# Patient Record
Sex: Male | Born: 1935 | Race: White | Hispanic: No | Marital: Married | Smoking: Former smoker
Health system: Southern US, Community
[De-identification: ages and names within clinical notes are randomized; demographics above are authoritative.]

## PROBLEM LIST (undated history)

## (undated) DIAGNOSIS — I4821 Permanent atrial fibrillation: Secondary | ICD-10-CM

## (undated) DIAGNOSIS — R0602 Shortness of breath: Secondary | ICD-10-CM

## (undated) DIAGNOSIS — H9190 Unspecified hearing loss, unspecified ear: Secondary | ICD-10-CM

## (undated) DIAGNOSIS — E039 Hypothyroidism, unspecified: Secondary | ICD-10-CM

## (undated) DIAGNOSIS — M1990 Unspecified osteoarthritis, unspecified site: Secondary | ICD-10-CM

## (undated) DIAGNOSIS — M549 Dorsalgia, unspecified: Secondary | ICD-10-CM

## (undated) DIAGNOSIS — I209 Angina pectoris, unspecified: Secondary | ICD-10-CM

## (undated) DIAGNOSIS — W19XXXA Unspecified fall, initial encounter: Secondary | ICD-10-CM

## (undated) DIAGNOSIS — I219 Acute myocardial infarction, unspecified: Secondary | ICD-10-CM

## (undated) DIAGNOSIS — Z950 Presence of cardiac pacemaker: Secondary | ICD-10-CM

## (undated) DIAGNOSIS — T8859XA Other complications of anesthesia, initial encounter: Secondary | ICD-10-CM

## (undated) DIAGNOSIS — I2510 Atherosclerotic heart disease of native coronary artery without angina pectoris: Secondary | ICD-10-CM

## (undated) DIAGNOSIS — J189 Pneumonia, unspecified organism: Secondary | ICD-10-CM

## (undated) DIAGNOSIS — T4145XA Adverse effect of unspecified anesthetic, initial encounter: Secondary | ICD-10-CM

## (undated) DIAGNOSIS — C801 Malignant (primary) neoplasm, unspecified: Secondary | ICD-10-CM

## (undated) HISTORY — DX: Permanent atrial fibrillation: I48.21

## (undated) HISTORY — PX: JOINT REPLACEMENT: SHX530

---

## 1997-10-25 ENCOUNTER — Encounter (HOSPITAL_COMMUNITY): Admission: RE | Admit: 1997-10-25 | Discharge: 1998-01-23 | Payer: Self-pay | Admitting: Cardiology

## 1997-12-15 ENCOUNTER — Ambulatory Visit (HOSPITAL_COMMUNITY): Admission: RE | Admit: 1997-12-15 | Discharge: 1997-12-15 | Payer: Self-pay | Admitting: Internal Medicine

## 1998-02-12 ENCOUNTER — Emergency Department (HOSPITAL_COMMUNITY): Admission: EM | Admit: 1998-02-12 | Discharge: 1998-02-12 | Payer: Self-pay | Admitting: Internal Medicine

## 1998-12-19 ENCOUNTER — Emergency Department (HOSPITAL_COMMUNITY): Admission: EM | Admit: 1998-12-19 | Discharge: 1998-12-19 | Payer: Self-pay | Admitting: Endocrinology

## 1999-02-01 ENCOUNTER — Ambulatory Visit (HOSPITAL_COMMUNITY): Admission: RE | Admit: 1999-02-01 | Discharge: 1999-02-01 | Payer: Self-pay | Admitting: Internal Medicine

## 1999-02-24 ENCOUNTER — Ambulatory Visit (HOSPITAL_COMMUNITY): Admission: RE | Admit: 1999-02-24 | Discharge: 1999-02-24 | Payer: Self-pay | Admitting: Internal Medicine

## 1999-02-24 ENCOUNTER — Encounter (INDEPENDENT_AMBULATORY_CARE_PROVIDER_SITE_OTHER): Payer: Self-pay | Admitting: Specialist

## 2000-09-22 ENCOUNTER — Encounter: Admission: RE | Admit: 2000-09-22 | Discharge: 2000-09-22 | Payer: Self-pay | Admitting: Orthopedic Surgery

## 2000-09-22 ENCOUNTER — Encounter: Payer: Self-pay | Admitting: Orthopedic Surgery

## 2000-09-25 ENCOUNTER — Ambulatory Visit (HOSPITAL_BASED_OUTPATIENT_CLINIC_OR_DEPARTMENT_OTHER): Admission: RE | Admit: 2000-09-25 | Discharge: 2000-09-25 | Payer: Self-pay | Admitting: Orthopedic Surgery

## 2001-07-31 ENCOUNTER — Encounter: Admission: RE | Admit: 2001-07-31 | Discharge: 2001-08-27 | Payer: Self-pay | Admitting: Cardiology

## 2001-10-23 ENCOUNTER — Encounter: Payer: Self-pay | Admitting: Internal Medicine

## 2001-10-23 ENCOUNTER — Encounter: Admission: RE | Admit: 2001-10-23 | Discharge: 2001-10-23 | Payer: Self-pay | Admitting: Internal Medicine

## 2002-08-02 ENCOUNTER — Emergency Department (HOSPITAL_COMMUNITY): Admission: EM | Admit: 2002-08-02 | Discharge: 2002-08-03 | Payer: Self-pay | Admitting: Emergency Medicine

## 2002-08-02 ENCOUNTER — Encounter: Payer: Self-pay | Admitting: Emergency Medicine

## 2004-02-19 ENCOUNTER — Ambulatory Visit (HOSPITAL_COMMUNITY): Admission: RE | Admit: 2004-02-19 | Discharge: 2004-02-19 | Payer: Self-pay | Admitting: Cardiology

## 2005-11-11 ENCOUNTER — Emergency Department (HOSPITAL_COMMUNITY): Admission: EM | Admit: 2005-11-11 | Discharge: 2005-11-11 | Payer: Self-pay | Admitting: Family Medicine

## 2006-01-21 ENCOUNTER — Emergency Department (HOSPITAL_COMMUNITY): Admission: EM | Admit: 2006-01-21 | Discharge: 2006-01-21 | Payer: Self-pay | Admitting: Family Medicine

## 2006-06-20 ENCOUNTER — Encounter: Admission: RE | Admit: 2006-06-20 | Discharge: 2006-06-20 | Payer: Self-pay | Admitting: Internal Medicine

## 2006-06-24 ENCOUNTER — Encounter: Admission: RE | Admit: 2006-06-24 | Discharge: 2006-06-24 | Payer: Self-pay | Admitting: Internal Medicine

## 2007-04-29 ENCOUNTER — Inpatient Hospital Stay (HOSPITAL_COMMUNITY): Admission: EM | Admit: 2007-04-29 | Discharge: 2007-05-03 | Payer: Self-pay | Admitting: Emergency Medicine

## 2007-04-29 ENCOUNTER — Encounter (INDEPENDENT_AMBULATORY_CARE_PROVIDER_SITE_OTHER): Payer: Self-pay | Admitting: Neurology

## 2007-04-30 ENCOUNTER — Encounter (INDEPENDENT_AMBULATORY_CARE_PROVIDER_SITE_OTHER): Payer: Self-pay | Admitting: Internal Medicine

## 2007-04-30 ENCOUNTER — Ambulatory Visit: Payer: Self-pay | Admitting: *Deleted

## 2007-05-02 DIAGNOSIS — Z950 Presence of cardiac pacemaker: Secondary | ICD-10-CM

## 2007-05-02 HISTORY — PX: PERMANENT PACEMAKER INSERTION: SHX6023

## 2007-05-02 HISTORY — DX: Presence of cardiac pacemaker: Z95.0

## 2007-05-17 ENCOUNTER — Emergency Department (HOSPITAL_COMMUNITY): Admission: EM | Admit: 2007-05-17 | Discharge: 2007-05-17 | Payer: Self-pay | Admitting: Family Medicine

## 2007-05-31 ENCOUNTER — Emergency Department (HOSPITAL_COMMUNITY): Admission: EM | Admit: 2007-05-31 | Discharge: 2007-05-31 | Payer: Self-pay | Admitting: Emergency Medicine

## 2007-10-29 ENCOUNTER — Ambulatory Visit: Payer: Self-pay | Admitting: Internal Medicine

## 2007-11-18 ENCOUNTER — Inpatient Hospital Stay (HOSPITAL_COMMUNITY): Admission: EM | Admit: 2007-11-18 | Discharge: 2007-11-20 | Payer: Self-pay | Admitting: Emergency Medicine

## 2007-11-18 ENCOUNTER — Encounter (INDEPENDENT_AMBULATORY_CARE_PROVIDER_SITE_OTHER): Payer: Self-pay | Admitting: Internal Medicine

## 2007-11-19 ENCOUNTER — Encounter (INDEPENDENT_AMBULATORY_CARE_PROVIDER_SITE_OTHER): Payer: Self-pay | Admitting: Internal Medicine

## 2008-03-22 ENCOUNTER — Inpatient Hospital Stay (HOSPITAL_COMMUNITY): Admission: EM | Admit: 2008-03-22 | Discharge: 2008-03-26 | Payer: Self-pay | Admitting: Emergency Medicine

## 2010-02-09 HISTORY — PX: NM MYOCAR PERF WALL MOTION: HXRAD629

## 2010-08-20 ENCOUNTER — Other Ambulatory Visit: Payer: Self-pay | Admitting: Dermatology

## 2010-09-17 HISTORY — PX: US ECHOCARDIOGRAPHY: HXRAD669

## 2010-12-03 ENCOUNTER — Other Ambulatory Visit: Payer: Self-pay | Admitting: Cardiology

## 2010-12-03 ENCOUNTER — Ambulatory Visit
Admission: RE | Admit: 2010-12-03 | Discharge: 2010-12-03 | Disposition: A | Discharge status: Home / Self Care | Payer: Medicare Other | Source: Ambulatory Visit | Attending: Cardiology | Admitting: Cardiology

## 2010-12-03 DIAGNOSIS — Z01818 Encounter for other preprocedural examination: Secondary | ICD-10-CM

## 2010-12-07 NOTE — Assessment & Plan Note (Signed)
[facility]                         GASTROENTEROLOGY OFFICE NOTE   Bradley Nixon, Bradley Nixon                        MRN:          [id number]  DATE:10/29/2007                            DOB:          1921/01/08    Mr. Bradley Nixon is a very nice 77 year old gentleman, patient of Dr. Su Hilt  who he saw in the past for colorectal screening.  He has had at least 3  previous colonoscopies, last 2 showed adenomatous polyps.  He also has  diverticulosis of the left colon.  For the past 6-8 months, he has been  having looser stools with some urgency and actually 3 episodes of  incontinence.  Two episodes occurred while in the car traveling to visit  his sister.  He could not quite make it to the bathroom and had to  change his underwear.  The episodes are not preceded by any abnormal  pain.  He continues to have regular bowel habits, 1 bowel movement every  morning.  He denies any rectal bleeding.  His eating habits have not  changed.  He has taken about 3 stool softeners over past 2 years;  usually Colace.  Patient had a permanent transvenous pacemaker placed by  Dr. Aleen Campi for what sounds like a bradycardia syndrome.   MEDICATIONS:  1. Coumadin as directed.  2. Flomax 5 mg 2 q. day.  3. Crestor 10 mg q. day.  4. Vitamin E.   PAST MEDICAL HISTORY:  1. Significant for heart attack 1964.  2. Stroke in 1996.  3. Hemorrhoid surgery 1982.  4. Hemorrhoidal banding just 2 years ago by Dr. Zachery Dakins.   FAMILY HISTORY:  Negative for colon cancer.   SOCIAL HISTORY:  Married with 2 children.  He does not smoke, does not  drink alcohol.   REVIEW OF SYSTEMS:  Weight is stable.  Written changes/problems:  Decreased hearing.   PHYSICAL EXAMINATION:  Blood pressure 112/66.  Pulse 80.  Weight 196  pounds.  Patient was slightly hard of hearing.  Alert and oriented.  Very  cooperative.  LUNGS:  Clear to auscultation.  COR:  Normal S1, normal S2. Cardiac pacemaker in the  right side of the  chest below the right clavicle.  ABDOMEN:  Soft, nontender with relaxed abdominal muscles.  Normoactive  bowel sounds.  No tenderness.  RECTAL:  Anoscopic exam reveals normal perianal area.  Rectal tone was  normal.  Squeeze was normal.  First grade internal hemorrhoids, 1 of  them was larger than others.  No evidence of active bleeding.  No  thrombosis.  Stool was Hemoccult negative.   IMPRESSION:  An 77 year old white male with change in the bowel habits  which may be a result of severe diverticulosis. He may have symptomatic  hemorrhoids.  He has problems with continence which may be related  either to rectal irritation due to hemorrhoids or possibly due to  organic brain sybdrome.   PLAN:  1. Metamucil a tablespoon daily to bulk up the stools to improve      colonic emptying.  2. Anusol HC suppositories times 12 days.  3. Hold  off on colonoscopy due to his age and cardiac problems.  4. I will see him again in about 8 weeks and reexamine him.  Consider      adding antispasmodic such as Bentyl, or Robinul to his regime.     Bradley Nixon. Juanda Chance, MD  Electronically Signed    DMB/MedQ  DD: 10/29/2007  DT: 10/29/2007  Job #: [id number]   cc:   Antony Madura, M.D.

## 2010-12-07 NOTE — Consult Note (Signed)
NAME:  Bradley Nixon, Bradley Nixon NO.:  [id number]   MEDICAL RECORD NO.:  [id number]          PATIENT TYPE:  EMS   LOCATION:  MAJO                         FACILITY:  [facility]   PHYSICIAN:  Pramod P. Pearlean Brownie, MD    DATE OF BIRTH:  05/30/1921   DATE OF CONSULTATION:  DATE OF DISCHARGE:                                 CONSULTATION   REASON FOR CONSULTATION:  Left-sided weakness and numbness.   CONSULTED REQUESTED:  Emergency room Dr. Rosalia Hammers.   Time neurology was called at 5:45 a.m.   Time of evaluation is 6 a.m.   Time of onset of symptoms was 3 a.m.   HISTORY AND FINDINGS:  Bradley Nixon is a pleasant 77 year old right-handed  Caucasian male with a history of coronary artery disease status post  pacemaker placement and history of CVA in the past who woke up at 3 a.m.  to go use the bathroom.  He felt that his left leg was weak and he was  having difficulties with his balance.  He denied any headache.  He also  noticed that his left arm was not right and may be was weak and his  mouth was dry.  He also complains of shortness of breath and some chest  pressure.  He had some mild headache on the left side, but states that  his face is numb.  As he was having these symptoms, he told his wife and  she called EMS and he was brought in here.  At the time of initial  evaluation per Dr. Rosalia Hammers in the emergency room, the patient had some left  hand droop and had balance problems.  He went into the CT.  At the time  of my examination, the patient complains of left facial numbness and leg  numbness, but no weakness.  He denies any chest pressure, does complain  of dry mouth and mild shortness of breath.  In the morning, since he  thought this was because of stroke-like symptoms, he took 1-1/2 pills of  his 5 mg of Coumadin.   His wife thinks he looks a lot better now.   Yesterday, he was feeling fine without any GI abnormalities and recently  he has had a physical with lead work that were all  unremarkable as per  the patient.   PAST MEDICAL HISTORY:  1. Cerebrovascular accident in 1996 where he says he passed out, but      does not remember any focal deficits.  2. Hyperlipidemia.  3. Myocardial infarction.  4. Bradycardic syndrome status post pacemaker placement last year.  5. Hard of hearing.   PAST SURGICAL HISTORY:  Cataract surgeries and pacemaker placement.   SOCIAL HISTORY:  The patient denied no history of alcohol, smoking, or  drug use.   FAMILY HISTORY:  No history of any CVA or coronary artery disease.  No  history of diabetes.   REVIEW OF SYSTEMS:  All systems were reviewed, but none positive as  mentioned in the history and physical.   PHYSICAL EXAMINATION:  VITALS SIGNS:  His blood pressure was 141/79,  pulse rate 92, respirations 16, temperature 96.7 and he is saturating  93% on 2-3 liters.  GENERAL:  [name] is a pleasant gentleman in no acute distress.  HEENT:  Normocephalic and atraumatic.  NECK:  Supple.  No carotid bruits.  CARDIOVASCULAR:  Regular rhythmic rate.  LUNGS:  Clear to air entry.  EXTREMITIES:  No cyanosis, clubbing or edema.  NEUROLOGIC:  The patient was awake, alert, and oriented.  No dysarthria  or aphasia.  No difficulty with speech or comprehension.  Cranial nerves II through XII were intact.  Visual field was intact to  confrontation.  Pupils were symmetric and reactive to light and  accommodation.  Extraocular movements were intact.  No facial asymmetry.  No facial deficits to fine touch or pin prick sensation.  Mild with  symmetric, palate elevation was symmetric.  Tongue was midline without  any atrophy or weakness.  Normal bulk and tone.  Strength is 5/5 in all  4 extremities.  No pronator drift or leg drift.  Deep tendon reflexes  were 2+ and symmetric in the upper extremities and knees and absent at  the ankles.  Sensation reveal normal to position, pinprick and touch,  though he does have mild subjective decreased  sensation to fine touch in  his left thigh, but normal to pinprick sensation.  Vibration was  decreased till the knees bilaterally.  Plantars  were downgoing.  Normal  finger-to-nose testing, rapid alternating movements and knee-heel  drifting.  Romberg was absent.  Gait was hesitant.   LABORATORY DATA:  His CT of the head was reviewed.  There was no acute  changes, still see some old lacunar stroke especially in the left basal  ganglion, which was apparently unchanged from October 2008.  His labs  were reviewed, his INR is 2.2.  CBC was unremarkable with a hemoglobin  of 13.3, hematocrit 38.8, platelets 212, and white count 7.7.  His  sodium 142, potassium 3.6, chloride 110, bicarb 22, BUN 24, creatine 1,  and glucose 111.  PTT was 36.  Cardiac enzymes were unavailable at this  time.   IMPRESSION:  Transient left-sided weakness/numbness with some chest  pressure.  The etiologies could be transient ischemic attack versus  cardiac ischemia/angina/infarction.  Currently, the patient's  symptoms  have totally resolved and his INR is therapeutic.  At this time, I would  not recommend any changes in the management and continue his INR.  We  would recommend getting carotid Dopplers as if there is a significant  narrowing.  The patient may be a candidate for stenting.  I will also  recommend echocardiogram and serial cardiac enzymes to evaluate for any  cardiac etiology as his symptoms have been on the left side and he does  notice some chest pressure and shortness of breath.  We will also  recommend physical therapy evaluation for his gait problems.  The  patient cannot get an MRI due to pacemaker placement.   The Stroke Consult Service will follow the patient in the morning.  The  plan was conveyed to the patient and to the emergency room physician.     Darnelle Bos, MD  Electronically Signed     ______________________________  Sunny Schlein. Pearlean Brownie, MD   RB/MEDQ  D:   11/18/2007  T:  11/18/2007  Job:  [id number]

## 2010-12-07 NOTE — H&P (Signed)
NAME:  Bradley Nixon NO.:  [id number]   MEDICAL RECORD NO.:  [id number]          PATIENT TYPE:  INP   LOCATION:  3021                         FACILITY:  [facility]   PHYSICIAN:  Marcellus Scott, MD     DATE OF BIRTH:  01/24/1921   DATE OF ADMISSION:  04/29/2007  DATE OF DISCHARGE:                              HISTORY & PHYSICAL   PRIMARY CARE PHYSICIAN:  Antony Madura, M.D.   PRIMARY CARDIOLOGIST:  Antionette Char, MD   CHIEF COMPLAINT:  Dizziness, vertigo, slurred speech, lower extremity  weakness with near collapse - resolved.   HISTORY OF PRESENT ILLNESS:  Bradley Nixon is a pleasant 77 year old  Caucasian male patient, with past medical history as indicated below.  He was in his usual state of health until this morning.  The patient  consumed breakfast between 7 and 8 a.m. 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  While standing in church, at about 11:10 a.m.,  the patient suddenly noticed blurred vision, dizziness, vertigo,  sensation of numbness of both lower extremities and feeling like he was  going to fall.  He called out for help and was seated onto a chair.  Subsequently, patient also felt some slurring of his speech and some  twisting of his mouth.  In a little while he was helped by colleagues  down the steps.  His wife subsequently reached the church.  The patient  could barely walk and had to be helped into the car.  The patient denies  any history of chest pain, palpitations or dyspnea or diaphoresis.  The  patient was nauseous.  There was no history of diplopia.  On arrival in  the emergency room, he was helped onto a wheelchair and onto the gurney.  He claims that any time he was moved around, the vertigo would get  worse, and the symptoms persisted.  The patient subsequently had a CT of  his head and blood work done.  At some point, while in the emergency  room, while being examined by the neurologist, the patient said suddenly  his symptoms all disappeared.  Since then,  the patient has had  intermittent vertigo when he is being turned around.  But other symptoms  have all resolved.   On arrival to the emergency room, a stroke code was called.  The patient  was initially seen by Neurology, who assessed him to have a TIA, with  resolution of his neurological deficits.  We were asked to admit the  patient.   PAST MEDICAL HISTORY:  1. Hyperlipidemia.  2. Status post myocardial infarction x3 - 1964/1994/1996.  3. Status post angioplasty x2 in 1994 and 1996.  4. Cerebral vascular accident in 1998, with no residual deficits after      rehabilitation.  5. Macular degeneration in the right eye.  6. Bilateral cataracts.  7. Deafness both ears, right greater than the left.   PAST SURGICAL HISTORY:  Right rotator cuff surgery.   ALLERGIES:  NO KNOWN DRUG ALLERGIES.   MEDICATIONS:  1. Coumadin 5 mg  on Tuesdays, Thursdays, Saturdays and Sundays.  2. Coumadin 7.5 mg on Mondays, Wednesdays and Fridays.  The patient      has been taking Coumadin since 1965 status post MI.  3. Crestor 10 mg p.o. q.h.s.  4. Flomax 0.8 mg p.o. daily.   FAMILY HISTORY:  The patient's mother died at age 48 years, and father  died at age 26 years from an MI.   SOCIAL HISTORY:  The patient is married.  He lives with his wife.  He is  independent of activities of daily living.  He is a retired traveling  Medical illustrator.  He briefly smoked between ages 68 and 24 years and quit.  There is no history of alcohol and drug abuse.   REVIEW OF SYSTEMS:  Fourteen systems reviewed and apart from the history  of presenting illness is noncontributory.   PHYSICAL EXAMINATION:  GENERAL:  Mr. Perrow is a moderately built, well-  nourished male patient in no obvious distress.  VITAL SIGNS:  Temperature 98.8 degrees Fahrenheit.  Pulse is 87 per  minute, regular.  Respirations, 22 per minute.  Blood pressure 136/85  mmHg.  Oxygen saturation of 95% on 2 liters  per minute.  HEAD, EYES, ENT:  Nontraumatic.  Normocephalic.  His pupils equally  reacting to light and accommodation.  Bilateral cataracts.  Right eye  with decreased visual acuity, which is chronic.  The patient has  bilaterally enlarged tonsils, but not inflamed.  NECK:  Without JVD, carotid bruit, lymphadenopathy or goiter.  Supple.  LYMPHATICS:  No lymphadenopathy.  RESPIRATORY SYSTEM:  Clear to auscultation bilaterally.  CARDIOVASCULAR SYSTEM:  First and second heart sounds are heard.  No  third or fourth heart sounds or murmurs or rubs or gallops or clicks.  ABDOMEN:  Is nondistended, nontender.  No organomegaly or mass  appreciated.  Bowel sounds are normally heard.  CENTRAL NERVOUS SYSTEM:  The patient is awake, alert, oriented x3.  There is no cranial nerve deficits.  EXTREMITIES:  With no cyanosis, clubbing or edema.  Peripheral pulses  are symmetrically felt.  There is no focal deficits or pronator drift.  SKIN:  Is without any rashes.   LABORATORY DATA:  His urinalysis is negative for features of urinary  tract infection.  CBC is normal, with a hemoglobin 14.4, hematocrit  42.8, INR of 2.1.  Basic metabolic panel is normal, with BUN of 13,  creatinine of 0.86.  Hepatic panel is normal.  Cardiac enzymes x1 is  normal.  CT of the head without contrast:  Impression is negative for  bleed or other intracranial process. Atrophy and old left  periventricular lacunar infarct.  MRI of the brain.  No acute  intracranial abnormality.  Mild chronic small vessel disease.  Mild left  frontal - ethmoid sinus disease.  MRA of the head is negative.   ASSESSMENT AND PLAN:  1. Transient ischemia attack.  We will admit patient to Neurology unit      with telemetry.  We will obtain an echo and carotid Doppler.  We      will follow up on patient's fasting lipids, hemoglobin A1c, TSH and      homocysteine.  We will also follow up on patient's 12-lead EKG.      The patient has passed a bedside  swallow evaluation.  We will place      the patient on a heart healthy diet and continue patient's Coumadin      anticoagulation.  The patient is therapeutically anticoagulated  at      this time.  2. Hyperlipidemia.  To check fasting lipids and continue patient's      Crestor.  3. Coronary artery disease status post previous myocardial infarctions      and angioplasty.  Asymptomatic of cardiorespiratory symptoms.  We      will cycle cardiac enzymes and continue statins and Coumadin.  4. Prostatomegaly.  To continue Flomax.      Marcellus Scott, MD  Electronically Signed     AH/MEDQ  D:  04/29/2007  T:  04/29/2007  Job:  [id number]   cc:   Antionette Char, MD  Antony Madura, M.D.

## 2010-12-07 NOTE — Consult Note (Signed)
NAME:  BALDWIN, RACICOT NO.:  [id number]   MEDICAL RECORD NO.:  [id number]          PATIENT TYPE:  EMS   LOCATION:  MAJO                         FACILITY:  [facility]   PHYSICIAN:  Pramod P. Pearlean Brownie, MD    DATE OF BIRTH:  Feb 06, 1921   DATE OF CONSULTATION:  DATE OF DISCHARGE:                                 CONSULTATION   REASON FOR CONSULTATION:  Code stroke.   HISTORY OF PRESENT ILLNESS:  Mr. Nijjar is an 77 year old Caucasian male  who developed sudden onset of slurred speech, blurred vision, facial  droop, leg weakness, and dizziness at 11:10 a.m. today.  He stated he  would have fallen down if people had not caught him.  The symptoms  persisted and EMS was called.  He was brought to [facility] Emergency  [facility].  A code stroke was called by Dr. Ignacia Palma.  As per the patient,  after he came back from CT scan, the patient had noted improvement in  symptoms.  Speech and vision were much improved.  He states that the  whole episode lasted for about 30-40 minutes.  He does have a previous  history of stroke in 1998, when he was given PPA with results.  He lives  independently.  He states he is on Coumadin because of cardiac issue,  but is not sure whether this for atrial fibrillation or not.   PAST MEDICAL HISTORY:  Significant for poor vision in the right eye due  to macular degeneration, hyperlipidemia, heart size, or old stroke.   MEDICATIONS:  1. Coumadin.  2. Crestal.  3. Flomax.   MEDICATION ALLERGIES:  None known.   FAMILY HISTORY:  Noncontributory.   SOCIAL HISTORY:  He is married.  He lives with his wife in [place].  He does not smoke or drink.   PRIMARY CARE PHYSICIAN:  Antony Madura, M.D.   CARDIOLOGIST:  Antionette Char, MD   PHYSICAL EXAMINATION:  GENERAL:  Reveals a pleasant, obese elderly  gentleman not in distress.  VITAL SIGNS:  Pulse 77 per minute, regular sinus; respiratory rate 20  minute, temperature 97.8, blood pressure 157/75,  saturations 94% on room  air.  HEAD:  Nontraumatic.  NECK:  Supple.  There is no bruit.  ENT:  Unremarkable.  CARDIAC:  No murmur or gallop.  LUNGS:  Clear to auscultation.  ABDOMEN:  Soft, nontender.  NEUROLOGIC:  Patient is awake, alert, oriented x3.  There is no aphasia,  apraxia, or dysarthria.  Pupils are equal and reactive.  Eye movements  are full range.  Face is symmetric bilateral.  Movements are normal.  Tongue is midline.Marland Kitchen  His right eye vision is poor due to the macular  degeneration.  There is, however, no visual field deficit noted.  Motor  system exam reveals no upper extremity drift, symmetric stent on  reflexes, coordination and sensation.  His NIH stroke scale is 0.  Gait  was not tested.   LABORATORY DATA:  Noncontrast CT scan done today reveals mild  generalized atrophy.  No acute abnormality, old lacunar infarct in the  left deep basal gangli.   Admission labs and coagulation labs are pending at this time.   IMPRESSION:  77 year old gentleman with possible vertebrobasilar TIA  which seems to have resolved.  Previous history of old left lacunar  infarct with vascular risk factors of heart disease and hyperlipidemia.   PLAN:  Patient is being admitted to the [facility] for stroke  risk identification and workup.  Check MRI scan of the brain with MRA of  the brain, carotid transcranial Doppler studies, 2D echocardiogram,  fasting lipid profile, hemoglobin A1c and normal cysteine levels.  Continue Coumadin.  Check PT/INR.   I will be happy to follow the patient in consult and call if questions.           ______________________________  Sunny Schlein. Pearlean Brownie, MD     PPS/MEDQ  D:  04/29/2007  T:  04/29/2007  Job:  [id number]   cc:   Antony Madura, M.D.

## 2010-12-07 NOTE — Discharge Summary (Signed)
NAME:  Bradley Nixon, Bradley Nixon NO.:  [id number]   MEDICAL RECORD NO.:  [id number]          PATIENT TYPE:  INP   LOCATION:  3706                         FACILITY:  [facility]   PHYSICIAN:  Antionette Char, MD    DATE OF BIRTH:  22-Nov-1920   DATE OF ADMISSION:  03/22/2008  DATE OF DISCHARGE:  03/25/2008                               DISCHARGE SUMMARY   DISCHARGE DIAGNOSES:  1. Chest pain, noncardiac, negative myocardial infarction.  2. Patent coronary arteries.  3. Anticoagulation with Coumadin presumed for cerebrovascular      accident.  We will resume as an outpatient with no crossover.  4. Hypotension after 4 sublingual nitro, resolved.  5. History of coronary artery disease with left atherectomy in 1998,      history of myocardial infarction in 1981.  6. History cerebrovascular accident and transient ischemic attack.  7. History of bradycardia with permanent transvenous pacemaker,      stable.   DISCHARGE CONDITION:  Improved.   PROCEDURES:  Combined left heart catheterization on March 25, 2008 by  Dr. Julieanne Manson.   DISCHARGE MEDICATIONS:  1. Aspirin 81 mg daily.  2. Toprol-XL 25 mg daily which is one half of his 50 mg tablet.  3. Crestor 10 mg daily.  4. Flomax 0.4 mg one twice a day.  5. Coumadin 5 mg daily except 7.5 mg Monday, Wednesday, and Friday as      before and began tonight, March 25, 2008.  6. Have Coumadin level checked on Friday of this week, March 28, 2008.  7. Omeprazole 20 mg one daily over-the-counter prescription given.   DISCHARGE INSTRUCTIONS:  1. Low-sodium heart-healthy diet.  2. Increase activity slowly.  3. May shower on February 2009.  No lifting for 2 days.  No driving      for 2 days.  4. Wash cath site with soap and water.  Call us if any bleeding,      swelling, or drainage.  5. Follow up with Dr. Aleen Campi next week.  Their office should call      you with the date and time.   HISTORY OF PRESENT ILLNESS:  An  77 year old white married male, quite  delightful to talk with with a history of coronary artery disease with  atherectomies in 1992 and 1998 and prior to that, a massive MI in 1964.  Last nuclear study was in 2005 without ischemia.  The patient was seen  by Dr. Aleen Campi in July 2009 and the patient has been doing well for  some time.  He did have permanent transvenous pacemaker placed in 2008  due to bradycardia.  On March 22, 2008, he was driving to early morning  meeting, developed midsternal chest pressure, 6/10 and that eventually  radiated across his chest.  No associated symptoms, went to the meeting,  someone gave him a nitroglycerin without relief and then someone drove  him home.  He had nausea on the way home.  He repeated his nitroglycerin  2 to 3 more times at home about 30-45 minutes apart without  relief.  We  instructed him go to the emergency room when he called to ask what to  do.  Blood pressure on arrival to the ER was low down to 88/50 probably  secondary to the nitroglycerin.  He was started on oxygen.  His pain was  completely resolved and blood pressure was stable on evaluation.  EKG  showed no acute changes.  He does have a right bundle-branch block and  he was in sinus rhythm on EKG.  Last 2-D echo was on November 21, 2007, EF  was 55-65%.  History as stated TIAs, CVA, BPH, dyslipidemia, history of  impaired hearing, history of cataracts, and arthritis.   ALLERGIES:  No known allergies.   FAMILY HISTORY, SOCIAL HISTORY, REVIEW OF SYSTEMS:  See H and P.   PHYSICAL EXAMINATION ON DISCHARGE:  VITAL SIGNS:  Blood pressure 100/77,  pulse 72, respiratory rate 18, temp 98.1, and oxygen saturation 2 liters  93%.  Prior to discharge, we will check his room air oxygen saturations  with ambulation as well.   Right groin site is stable.   LABORATORY DATA:  Chest x-ray on admission, borderline cardiomegaly and  pulmonary vascular congestion, though he was hypotensive and  his BNP was  58.  The patient denied any shortness of breath on admission.   Hemoglobin on admission 14, hematocrit 42.5, WBC 6.8, and platelets 192.  These remained stable at discharge.  Hemoglobin 13, hematocrit 39, WBC  9.3, and platelets were normal.   Chemistry on admission; sodium 135, potassium 4, chloride 106, CO2 22,  BUN 13, creatinine 0.91, glucose 109.  At discharge, remained the same.   Coags on admission; protime 25 with INR of 2.1, PTT 39 and at discharge,  protime 19.3, INR of 1.6 and he had been on heparin, but that was  discontinued.  LFTs were normal.  Amylase was normal at 85, lipase  normal at 16.  Cardiac markers; CK-MBs were all negative 96, 77, 86 with  MBs 2.5, 1.8, and 2.2.  Troponin Is all negative, 0.02 to less than  0.01.  Calcium 8.3-8.6, magnesium 2.3.  BNP was 58 and TSH 2.978 and  urinalysis was clear.   EKG as stated.  Sinus rhythm with a right bundle-branch block.  No acute  changes.  Cardiac cath, please see dictated report.  Widely patent RCA,  PDA, and PLA.  Left main was normal.  Circumflex was stable.  Mild mid  distal stenosis of the LAD is best I can read from Dr. Fredirick Maudlin writing.  EF was greater than 55% with 1+ MR, distal aortogram, left renal artery  20-30% ostial stenosis, right stable.  No abdominal aortic aneurysm and  iliac for torturous.   HOSPITAL COURSE:  The patient was admitted with chest pain.  Enzymes  were negative.  He was placed on IV heparin once his INR was less than 2  and his Coumadin was held.  He underwent cardiac catheterization on  March 25, 2008 with Dr. Clarene Duke and was found to have stable coronary  arteries.  Dr. Aleen Campi of [facility] was  on-call for had seen him on the 31st and felt cardiac cath would be the  correct way to go on the prudent decision.  The patient was agreeable to  procedure.  The patient tolerated the procedure well and will discharge  home on March 25, 2008 if he ambulates without  problems.  He will  follow up with Dr. Aleen Campi in 1 week.  Darcella Gasman. Annie Paras, N.P.      Antionette Char, MD  Electronically Signed    LRI/MEDQ  D:  03/25/2008  T:  03/26/2008  Job:  [id number]   cc:   Thereasa Solo. Little, M.D.  Antony Madura, M.D.

## 2010-12-07 NOTE — Discharge Summary (Signed)
NAME:  Bradley Nixon, Bradley Nixon NO.:  [id number]   MEDICAL RECORD NO.:  [id number]          PATIENT TYPE:  INP   LOCATION:  2033                         FACILITY:  [facility]   PHYSICIAN:  Wilson Singer, M.D.DATE OF BIRTH:  1921-07-18   DATE OF ADMISSION:  04/29/2007  DATE OF DISCHARGE:  05/03/2007                               DISCHARGE SUMMARY   FINAL DISCHARGE DIAGNOSES:  1. Transient ischemic attack, resolved.  2. 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  3. Coronary artery disease.  4. Hyperlipidemia.  5. Benign prostatic hyperplasia.   MEDICATIONS ON DISCHARGE:  Coumadin to continue as per home dosing,  which was 5 mg Tuesday, Thursday, Saturdays and Sundays and 7.5 mg  Mondays, Wednesdays and Fridays, Crestor 10 mg daily, Flomax 0.8 mg  daily, Tylenol with Codeine #3 1 tablet b.i.d. as needed for pain.   CONDITION ON DISCHARGE:  Stable.   HISTORY:  GENERAL:  This very pleasant 77 year old man, was admitted  with a sudden onset of blurred vision, dizziness, vertigo, at 11:00 in  the morning in church.  Please see initial history physical examination  done by Dr. Marcellus Scott.   HOSPITAL PROGRESS:  The patient was admitted, and neurology saw him.  Neurology felt that he indeed did have a possible vertebrobasilar TIA,  which clearly had resolved.  He underwent bilateral carotid artery  Dopplers, which did not show any significant internal carotid artery  narrowing.  He also was seen by cardiology, as he appeared to have  second-degree heart block on his echocardiogram.  The cardiologist  degree that he had sinus and AV nodal disease, and that a permanent  pacemaker would be appropriate at this stage.  Therefore, he did have a  permanent pacemaker inserted on May 02, 2007.  An echocardiogram had  been done also, which showed a good ejection fraction of 65%.  There  was  no diagnostic evidence of left ventricular wall motion abnormalities.  His symptoms had resolved, and he has done well status post dual-chamber  pacemaker implantation.  His Coumadin was discontinued, prior to the  procedure, and a reasonable INR was achieved prior to proceeding.  Today, he is doing well post-pacemaker insertion.   PHYSICAL EXAMINATION:  VITAL SIGNS:  Temperature 98.1, blood pressure  103/64, pulse 77, saturation 93% on room air.  HEART:  Sounds are present and normal.  LUNG:  Fields are clear.  He is alert and oriented with no focal  neurological signs.   INVESTIGATIONS:  Today shows sodium of 140, potassium 4.0, bicarbonate  27, BUN 15, creatinine 1.04, hemoglobin 13.4, white blood cell count  10.0, platelets 192.  INR today is 1.4.   FURTHER DISPOSITION:  Cardiology has not seen him this morning, but once  they feel happy, he can be released to go home.  He can certainly go  home, and he will continue to re-start his Coumadin as before, per the  neurologist's recommendations.  I would recommend that the followup with  the cardiologist, Dr. Aleen Campi, in the next 1 to 2 weeks and also with  his primary care physician in the next 1 to 2 weeks.      Wilson Singer, M.D.  Electronically Signed     NCG/MEDQ  D:  05/03/2007  T:  05/03/2007  Job:  [id number]

## 2010-12-07 NOTE — Discharge Summary (Signed)
NAME:  Bradley Nixon, Bradley Nixon NO.:  [id number]   MEDICAL RECORD NO.:  [id number]          PATIENT TYPE:  INP   LOCATION:  3731                         FACILITY:  [facility]   PHYSICIAN:  Isidor Holts, M.D.  DATE OF BIRTH:  [birth year]   DATE OF ADMISSION:  11/18/2007  DATE OF DISCHARGE:  11/20/2007                               DISCHARGE SUMMARY   PRIMARY MEDICAL DOCTOR:  Dr. Burton Apley.   PRIMARY CARDIOLOGIST:  Dr. Aleen Campi.   DISCHARGE DIAGNOSES:  1. TIA with transient left hemiparesis.  2. History of coronary artery disease.  3. Dyslipidemia.  4. BPH.  5. Previous history of TIAs, currently on chronic anticoagulation.  6. Cerebrovascular disease, status post CVA 1998 with minimal deficit.  7. History of bradyarrhythmia, status post permanent pacer.  8. History of deafness.  9. History of cataracts.   DISCHARGE MEDICATIONS:  1. Crestor 5 mg p.o. daily.  2. Flomax 0.4 mg p.o. daily.  3. Toprol XL 12.5 mg p.o. daily.  4. Aspirin enteric-coated 81 mg p.o. daily over-the-counter.  5. Coumadin per prior regimen, i.e. 5 mg alternating with 7.5 mg at      06:00p.m. daily.   PROCEDURES:  1. Head CT scan dated November 18, 2007.  This showed no acute      intracranial abnormalities.  There was moderate cortical atrophy      and old lacunar strokes in the left basal ganglia unchanged since      October 2008.  2. Bilateral carotid and vertebral artery Duplex dated November 19, 2007.      This showed no ICA stenosis bilaterally.  Vertebral artery blood      flow is antegrade.  3. A 2-D echocardiogram dated November 19, 2007.  Report still pending at      the time of this evaluation.   CONSULTATIONS:  1. Dr. Delia Heady, neurologist.  2. Dr. Aleen Campi, cardiologist.   ADMISSION HISTORY:  As in H&P notes of November 18, 2007, dictated by Dr.  Lonia Blood.  However, in brief, this is an 77 year old male, with known  history of previous TIAs, coronary artery disease status post  MI x3,  status post stent times two, previous CVA, minimal deficit, BPH,  dyslipidemia, bradyarrhythmia status post pacemaker placement, on  chronic anticoagulation, who presents with a transient episode of left-  sided weakness.  He was brought to the emergency department, where  initial evaluation showed residual left-sided weakness, albeit much  improved from the time of onset.  He was admitted for further  evaluation, investigation and management.   CLINICAL COURSE:  1. TIA.  For details of presentation, refer to admission history      above.  The patient was evaluated by Dr. Delia Heady on neurology      consultation.  At the time of evaluation, his focal symptomatology      had practically resolved. It was concluded that this was likely      TIA.  However, appropriate workup was recommended.  The patient      underwent head CT scan which showed no evidence of  acute      intracranial pathology.  Brain MRI unfortunately could not be done,      secondary to presence of pacemaker.  The patient's INR was      therapeutic at 2.2.  He was commenced on low-dose Aspirin, in      addition to Coumadin anticoagulation.  Bilateral carotid/vertebral      artery duplex was negative for stenosis.  Lipid profile was      excellent.  The patient was continued on statin.  He was evaluated      by PT/OT, and outpatient follow-up was recommended.  By November 20, 2007, the patient was asymptomatic, and very keen to be discharged.      He was therefore discharged for routine follow-up with his primary      MD.   1. Coronary artery disease.  There were no symptoms referable to this,      during the course of the patient's hospitalization.  He did undergo      a 2-D echocardiogram and was seen by the cardiologist during his      hospitalization. Routine outpatient follow-up has been recommended.      Cardiac enzymes were un-elevated.  A 12-lead EKG showed no acute      ischemic changes.   1.  Dyslipidemia.  The patient's lipid profile was as follows:  Total      cholesterol 135, triglyceride 102, HDL of 38, LDL 77.  TSH was      normal at 1.167, i.e. excellent lipid profile.  The patient has      recommended to continue his pre-admission statin dosage.   1. BPH.  There were no problems referable to this.  Patient continues      on Flomax.   1. History of bradyarrhythmia, status post pacemaker.  No problems      referable to this.   DISPOSITION:  The patient was on November 20, 2007, completely  asymptomatic, keen to go home, and considered clinically stable for  discharge. He was discharged accordingly.   DISCHARGE INSTRUCTIONS:  1. Diet:  Heart-healthy.  2. Activity:  As tolerated.   FOLLOW-UP INSTRUCTIONS:  The patient is to follow up with his primary MD  Dr. Zenaida Deed, per prior scheduled appointment.  Also, with his  primary cardiologist Dr. Aleen Campi, per prior scheduled appointment.      Isidor Holts, M.D.  Electronically Signed     CO/MEDQ  D:  11/20/2007  T:  11/20/2007  Job:  [id number]   cc:   Antony Madura, M.D.  Antionette Char, MD  Pramod P. Pearlean Brownie, MD

## 2010-12-07 NOTE — Discharge Summary (Signed)
NAME:  Bradley Nixon, STGERMAINE NO.:  [id number]   MEDICAL RECORD NO.:  [id number]          PATIENT TYPE:  INP   LOCATION:  3706                         FACILITY:  [facility]   PHYSICIAN:  Antionette Char, MD    DATE OF BIRTH:  February 10, 1921   DATE OF ADMISSION:  03/22/2008  DATE OF DISCHARGE:  03/26/2008                               DISCHARGE SUMMARY   ADDENDUM:  Please note the patient was kept an extra night in the  hospital secondary to hypotension after cardiac catheterization.  Pressure, systolic dropped down to 88.  We gave him 250 mL fluid bolus,  IV fluids at a 100-hour x4, then 15-hour until midnight, and then stop  the fluids and he felt much better by the morning of March 26, 2008.   His labs were all stable, all within normal limits the morning of  discharge.  Additionally, the patient on his list and he had forgotten  to let us know that he is on amiodarone 200 mg twice a day at home.  He  had actually run out because the pharmacy was out, but he is to resume  that and we started it in the hospital and I believed Dr. Aleen Campi put  him on this probably with interrogation.  He got a hired of his  pacemaker and probably found some PAF, so he is on amiodarone as well as  his other medications previously listed.   The patient will follow up with Dr. Aleen Campi next week.  Any problems  prior to then, he will call Dr. Aleen Campi.      Darcella Gasman. Annie Paras, N.P.      Antionette Char, MD  Electronically Signed    LRI/MEDQ  D:  03/26/2008  T:  03/26/2008  Job:  [id number]   cc:   Thereasa Solo. Little, M.D.  Antony Madura, M.D.

## 2010-12-07 NOTE — Cardiovascular Report (Signed)
NAME:  Bradley Nixon, Bradley Nixon NO.:  [id number]   MEDICAL RECORD NO.:  [id number]          PATIENT TYPE:  INP   LOCATION:  2033                         FACILITY:  [facility]   PHYSICIAN:  Antionette Char, MD    DATE OF BIRTH:  [birth year]   DATE OF PROCEDURE:  05/02/2007  DATE OF DISCHARGE:                            CARDIAC CATHETERIZATION   Procedure done by Dr. Aleen Campi.   PROCEDURE:  Insertion of dual chamber permanent transvenous pacemaker  DDD mode.   INDICATIONS FOR PROCEDURE:  This 77 year old male was admitted with  symptoms of TIA and a stroke was ruled out.  On telemetry he was noted  to have second degree AV block and evidence for sick sinus syndrome with  marked sinus arrest and it was documented that his symptoms were related  to the bradyarrhythmia.  During his symptomatic episodes, he would have  heart rates in the low 30s with the new onset of severe bradyarrhythmia  from panconduction disorder including sick sinus syndrome and AV nodal  disease.  He was then scheduled for a cardiac catheterization for  insertion of a dual chamber pacemaker.   PROCEDURE IN DETAIL:  After signing an informed consent, the patient was  premedicated with 5 mg of Valium by mouth and brought to the cardiac  catheterization lab at [facility].  His right anterior chest  and base of neck were prepped and draped in a sterile fashion and a  right transverse subclavicular plane was anesthetized locally with 1%  lidocaine.  An incision was made in this anesthetized plane with the  incision being deepened into the fascial layer overlying the pectoralis  muscle.  After anesthetizing the fascial plane, a pocket was formed in  this plane for later insertion of the pulse generator.  We then inserted  two #7 [place] introducer sheaths percutaneously into the right  subclavian vein with the Seldinger wires being easily passed into the  superior vena cava.  We selected a bipolar  ventricular lead made by [facility].  [employer] model [phone], serial [id number].  After proper preparation,  this lead was inserted through the first [place] introducer sheath and  advanced to the superior vena cava.  We then selected a bipolar atrial J  lead made by [facility] model [phone], serial [id number].  Again,  after proper preparation, is inserted through the second [facility] introducer  sheath and advanced to the superior vena cava.  Both sheaths were  removed in the usual fashion.  Ventricular lead was then advanced into  the right atrium and retrograde across the tricuspid valve into the  right ventricle and positioned in the right ventricular apex.  Patient  parameters were obtained with R wave sensitivity of 10.9 millivolts, a  resistance of 935 ohms and a minimum voltage threshold of 0.4 volts  utilizing 0.4 milliamps of current.  We then advanced the atrial J lead  into the right atrium where the tip was positioned anteriorly in the  right atrial appendage.  Again, very good pacing parameters were  obtained with P wave sensitivity of  4.5 millivolts, a resistance of 575  ohms and a minimum voltage threshold of 0.9 volts utilizing 1.7  milliamps of current.  Both leads were then secured properly at their  insertion sites using 2-0 silk.  We then selected a pulse generator made  by [facility] model [place] model B062706, serial [id number].  After  properly analyzing the pulse generator, it was attached to the pacing  electrodes in the usual fashion.  The wound was lavaged profusely with  the kanamycin solution.  The pulse generator was then placed within the  previously formed pocket and the wound was closed in layers using 2-0  Vicryl.  Final skin closure was obtained with a cutaneous layer of Steri-  Strips.  The patient tolerated the procedure well and no complications  were noted.  At the end of the procedure, a sterile bulky dressing was  applied to the wound and he was returned  to his room in satisfactory  condition.   MEDICATIONS GIVEN:  None.   The pacemaker is noted to be functioning normally in the DDD mode.  Wound care instructions were given.      Antionette Char, MD  Electronically Signed     JRT/MEDQ  D:  05/02/2007  T:  05/02/2007  Job:  [id number]   cc:   Antionette Char, MD  Catheterization Laboratory at [facility]

## 2010-12-07 NOTE — Cardiovascular Report (Signed)
NAME:  Bradley Nixon, Bradley Nixon NO.:  [id number]   MEDICAL RECORD NO.:  [id number]          PATIENT TYPE:  INP   LOCATION:  3706                         FACILITY:  [facility]   PHYSICIAN:  Thereasa Solo. Little, M.D. DATE OF BIRTH:  1920-12-12   DATE OF PROCEDURE:  03/25/2008  DATE OF DISCHARGE:                            CARDIAC CATHETERIZATION   INDICATIONS FOR TEST:  This 77 year old male has remote history of  coronary disease with 2 interventions in the mid 90s and a cardiac  arrest in the late 60s.  He was admitted with chest pain.  He is on and  because of CVAs, the Coumadin has been discontinued.  His INR is now  subtherapeutic and he is brought to the cath lab for evaluation of his  coronary anatomy.   After obtaining informed consent, the patient was prepped and draped in  the usual sterile fashion exposing the right groin.  Following local  anesthetic with 1% Xylocaine, the Seldinger technique was employed and a  5-French introducer sheath was placed in the right femoral artery.  It  was obvious on fluoroscopy with the short J-wire used in place of the  sheath that the iliacs were tortuous.  Because of this, the right  coronary catheter with a Glidewire was used and advanced through the  tortuous iliacs and up into the ascending aorta.  Left and right  coronary arteriography and ventriculography, and a distal aortogram was  performed.   COMPLICATIONS:  None.   TOTAL CONTRAST:  140 mL.   EQUIPMENTS:  1. A 5-French Judkins configuration catheters.  2. A JL-3.5 was used to subselectively engage the left system.  The      coronary arteries appeared to be slightly out of plane.   MEDICATIONS:  IV Versed 1 mg.   RESULTS:  1. Hemodynamic monitoring.  Central aortic pressure was 98/53.  Left      ventricular pressure was 104/4, and there was no significant      gradient noted at the time of pullback.  2. Ventriculography.  Ventriculography in the RAO projection done  at      the end of the procedure showed normal LV systolic function.  There      were no wall motion abnormalities.  The ejection fraction was in      excess of 55%.  There was +1 mitral regurgitation and the left      ventricular end-diastolic pressure was 16.  3. Distal aortogram.  The distal aortogram done at the level of the      renal artery shows the left renal artery to have 20-30% ostial      narrowing.  The right renal artery was free of disease.  There was      no abdominal aortic aneurysm and the iliacs were tortuous without      significant stenosis.   Coronary arteriography:  The right coronary artery was widely patent.  It gave rise to a PDA and a posterior lateral vessel that were also free  of disease.  1. Left main normal, it bifurcated.  2. Circumflex.  The ongoing circumflex was free of disease.  It gave      rise to a very large trifurcating OM #1 that was free of disease.  3. The LAD across the apex of the heart had mild mid to distal      irregularities.  The first diagonal was patent with minimal ostial      narrowing.   CONCLUSION:  1. No significant coronary disease.  2. Normal left ventricular systolic function.  3. A +1 mitral regurgitation.  4. Tortuous iliacs.  5. Ostial left renal artery stenosis of 20-30%.   I cannot explain his chest pain from his cardiac anatomy.   I spoke with Dr. Aleen Campi, who wants the patient to be discharged to  home later today.  We will start his home dose of Coumadin this evening.  He will follow up with Dr. Aleen Campi in 1 week.           ______________________________  Thereasa Solo. Little, M.D.     ABL/MEDQ  D:  03/25/2008  T:  03/25/2008  Job:  [id number]   cc:   Antony Madura, M.D.  Antionette Char, MD  Catheterization Laboratory

## 2010-12-07 NOTE — H&P (Signed)
NAME:  Bradley Nixon, Bradley Nixon NO.:  [id number]   MEDICAL RECORD NO.:  [id number]          PATIENT TYPE:  INP   LOCATION:  3731                         FACILITY:  [facility]   PHYSICIAN:  Lonia Blood, M.D.      DATE OF BIRTH:  1921-03-20   DATE OF ADMISSION:  11/18/2007  DATE OF DISCHARGE:                              HISTORY & PHYSICAL   PRIMARY CARE PHYSICIAN:  Formerly Dr. Karilyn Cota, now Dr. Burton Apley.   CARDIOLOGIST:  Dr. Aleen Campi.   PRESENTING COMPLAINT:  Left-sided weakness.   HISTORY OF PRESENT ILLNESS:  The patient is an 77 year old gentleman  with history of dyslipidemia, TIA, and coronary artery disease status  post pacemaker placement who got up in the middle of the night with  complete weakness of his left side.  The patient was unable to move his  hands or leg.  He was rushed to emergency room.  Per patient when the  incident happened, he was able to take his Coumadin and also took some  nitroglycerin.  He had some mild shortness of breath, but no chest pain.  No nausea, vomiting, or diarrhea.  No loss of consciousness.  In the ED,  his functional status is improving steadily.  At this point, all his  symptoms have resolved.  The patient was evaluated by neurology, also  found to have recovered virtually all his function on the left side.   PAST MEDICAL HISTORY:  Significant for TIA, coronary artery disease,  chronic anticoagulation due to coronary artery disease.  He is status  post pacemaker placement, also dyslipidemia and BPH.   ALLERGIES:  He has no known drug allergies.   MEDICATIONS:  Include Crestor 5 mg daily, Flomax 0.4 mg daily, and  Coumadin 5 mg alternating with 7.5 mg.   SOCIAL HISTORY:  The patient is married, lives in [place].  He denied  any tobacco or alcohol or IV drug use.   FAMILY HISTORY:  Noncontributory.   REVIEW OF SYSTEMS:  A 12-point review of systems is negative except per  HPI.   PHYSICAL EXAMINATION:  VITALS:  On  exam, temperature 96.7, blood  pressure 141/79, pulse 92, respiratory rate 16, and sat 93% on room air.  GENERAL:  The patient is pleasant, alert, oriented, and in no acute  distress.  HEENT:  PERRL.  EOMI.  NECK:  Supple.  No JVD.  No lymphadenopathy.  RESPIRATORY:  The patient has good air entry bilaterally.  No wheezes.  No rales.  CARDIOVASCULAR:  S1 and S2.  No murmur.  ABDOMEN:  Soft and nontender with positive bowel sounds.  EXTREMITIES:  No edema, cyanosis, or clubbing.  NEUROLOGIC:  The patient has cranial nerves II through XII intact.  He  has good power 5/5, upper and lower extremities respectively.  Reflexes  are 2+ bilaterally, upper and lower extremities.  The patient had good  gait at this point.   LABORATORY DATA:  White count 6.7, hemoglobin 13.3, platelet count 212.  PT 25.5 and INR 2.2.  Sodium 142, potassium 3.6, chloride 110, BUN 24,  creatinine 1.0, glucose  111, and calcium 1.0.  CT head without contrast  showed no acute intracranial abnormalities.  There was moderate cortical  atrophy and lacunar stroke in the left basal ganglia, but these are  unchanged since 2008.  His EKG showed paced rhythm with a rate of 67,  some first-degree AV block with occasional premature ventricular  complexes.   ASSESSMENT:  Therefore, this is an 77 year old gentleman that is coming  in with what appears to be a transient ischemic attack.  The patient is  already anticoagulated and therapeutic on his Coumadin with an INR of  2.2.  He has risk factors for transient ischemic attack including  previous transient ischemic attack and at this point has recovered most  of his functions.   PLAN:  1. TIA.  We will admit the patient and check carotid Dopplers and a 2-      D echocardiogram.  Will check homocysteine level, also B12 level,      and RPR.  We will continue with his full anticoagulation at this      point with no specific change in management.  2. Coronary artery disease.  We  will check serial cardiac enzymes as      cardiac causes may be responsible for his symptoms.  He may be      having some vascular spasm also.  In the meantime, we will continue      with his home medications.  3. Dyslipidemia.  I will check fasting lipid panel and continue his      Crestor.  4. BPH.  We will continue with his Flomax at this point.  5. Finally, we will get PT, OT to work with the patient and hopefully      transition him home as soon as possible.      Lonia Blood, M.D.  Electronically Signed     LG/MEDQ  D:  11/18/2007  T:  11/18/2007  Job:  [id number]

## 2010-12-09 ENCOUNTER — Ambulatory Visit (HOSPITAL_COMMUNITY)
Admission: RE | Admit: 2010-12-09 | Discharge: 2010-12-09 | Disposition: A | Discharge status: Home / Self Care | Payer: Medicare Other | Source: Ambulatory Visit | Attending: Cardiology | Admitting: Cardiology

## 2010-12-09 ENCOUNTER — Ambulatory Visit (HOSPITAL_COMMUNITY): Admit: 2010-12-09 | Payer: Medicare Other | Admitting: Cardiology

## 2010-12-09 DIAGNOSIS — I4891 Unspecified atrial fibrillation: Secondary | ICD-10-CM | POA: Insufficient documentation

## 2010-12-09 DIAGNOSIS — Z950 Presence of cardiac pacemaker: Secondary | ICD-10-CM | POA: Insufficient documentation

## 2010-12-09 LAB — PROTIME-INR: INR: 3.32 — ABNORMAL HIGH (ref 0.00–1.49)

## 2010-12-10 NOTE — Op Note (Signed)
[place]  Patient:    JONATHANDAVID, MARLETT                     MRN: [id number] Proc. Date: 09/25/00 Adm. Date:  [id number] Attending:  Twana First                           Operative Report  PREOPERATIVE DIAGNOSIS: Right shoulder rotator cuff tear, with impingement.  POSTOPERATIVE DIAGNOSES:  1. Right shoulder partial rotator cuff tear.  2. Right shoulder impingement syndrome.  3. Right shoulder adhesive capsulitis.  OPERATION/PROCEDURE:  1. Right shoulder examination under anesthesia, followed by arthroscopic     debridement of partial rotator cuff tear.  2. Right shoulder subacromial decompression.  3. Right shoulder manipulation.  SURGEON: Elana Alm. Thurston Hole, M.D.  ASSISTANT: Kirstin A. Shepperson, P.A.  ANESTHESIA: General.  OPERATIVE TIME: Operative time was 45 minutes.  COMPLICATIONS: None.  INDICATIONS FOR PROCEDURE: Mr. Pridgen is a 77 year old gentleman who has had three months of increasing right shoulder pain, with signs and symptoms consistent with rotator cuff tear, partial versus complete tear; confirmed by MRI.  He has failed conservative care and is now to undergo arthroscopy.  DESCRIPTION OF PROCEDURE: Mr. Edmonston was brought to the operating room on September 25, 2000 and placed on the operating room table in the supine position. After an adequate level of general anesthesia was obtained his right shoulder was examined under anesthesia.  Initial range of motion was forward flexion of 170 degrees, abduction 150 degrees, internal and external rotation of 70 degrees.  Gentle manipulation was carried out, breaking up soft adhesions and improving range of motion to full.  The shoulder remained stable to ligamentous examination.  After this was done he was placed in the lateral position, right side up, and secured on the bed with the bean bag.  All genitalia and pressure points were well padded.  His right shoulder and  arm were prepped using sterile Betadine and draped using sterile technique. Fifteen pounds of abduction lateral traction was placed.  After this was done through a posterior portal the arthroscope with a pump attached was placed and through the anterior portal an arthroscopic probe placed.  On initial inspection the articular cartilage in the glenohumeral joint showed mild grade 1-2 chondromalacia, which was debrided.  The anterior labrum had a small partial tear, 25%, which was debrided.  The posterior labrum had mild fraying, which was debrided.  The biceps tendon had partial tearing, 25%, which was debrided.  The biceps tendon was intact otherwise and the biceps tendon anchor was intact.  The inferior labrum and anterior-inferior glenohumeral ligaments were intact.  The rotator cuff showed a partial tear, 50%, of the supraspinatus and infraspinatus, which was debrided arthroscopically, but it was otherwise found to be intact.  After this was done the subacromial space was entered and a lateral arthroscopic portal made.  Moderately thickened bursitis was resected with an [employer], [employer].  After this was done then the rotator cuff was thoroughly visualized and from the bursal side there was found to be fraying, again with partial tearing, but a complete tear was not found.  Subacromial decompression was carried out, removing 6-8 mm of the undersurface of the anterior, anterolateral, and anteromedial acromion, and CA ligament release carried out.  The [facility] joint was also co-planed.  After this was done through the lateral portal I also palpated the rotator cuff but,  again, I could not appreciate a complete tear.  At this point the shoulder could be brought throughout full range of motion with no impingement on the rotator cuff.  At this point it was felt that all pathology had been satisfactorily addressed.  A catheter was placed in the subacromial space for postoperative pain control.  The  subacromial space was injected also with 0.5% Marcaine.  Sterile dressings were applied after the portals were closed with 3-0 Nylon suture, and the catheter taped in place.  A sling was applied.  The patient was turned to the supine position, awakened, and taken to the recovery room in stable condition.  Sponge, needle, and instrument counts were correct x 2 at the end of the case.  FOLLOW-UP CARE: Mr. Evilsizer will be followed as an outpatient, on Percocet for pain control, for early physical therapy.  He will remove the pain pump in two days per our instructions and we will see him back in the office in a week for suture removal and follow-up. DD:  09/25/00 TD:  09/25/00 Job: [id number]

## 2011-01-08 NOTE — Cardiovascular Report (Signed)
  NAME:  Bradley Nixon, Bradley Nixon NO.:  [id number]  MEDICAL RECORD NO.:  [id number]           PATIENT TYPE:  O  LOCATION:  [facility]                         FACILITY:  [facility]  PHYSICIAN:  Thereasa Solo. Nialah Saravia, M.D. DATE OF BIRTH:  Jan 30, 1921  DATE OF PROCEDURE:  12/09/2010 DATE OF DISCHARGE:  12/09/2010                           CARDIAC CATHETERIZATION   This 77 year old male has a permanent pacemaker and has been in atrial fibrillation now for about 4-1/2 months.  He was initially reluctant to proceed on with cardioversion, but has now opted to pursue restoration of his rhythm.  He already has a permanent pacemaker, and he has a normal ejection fraction.  His left atrial dimension by echo on November 04, 2010, was 4.9 cm.  His INR today is 3.3.  With Anesthesia at the bedside, the patient was given 100 mg of IV Diprivan.  He underwent elective cardioversion prior to having signed the prior informed consent.  A 120-W seconds was used.  He converted into a sinus rhythm but went right back into atrial fibrillation.  The same anterior-posterior pads were used, a 150-W seconds now converted him back into a sinus rhythm with frequent PABs.  Because of this, we turned on the atrial fibrillation suppression option in his [employer] pacemaker.  Maximum paced rate will be 90.  He is currently maintaining a sinus rhythm.          ______________________________ Thereasa Solo. Ericah Scotto, M.D.     ABL/MEDQ  D:  12/09/2010  T:  12/10/2010  Job:  [id number]  Electronically Signed by Julieanne Manson M.D. on 01/08/2011 10:58:38 AM

## 2011-03-08 ENCOUNTER — Other Ambulatory Visit: Payer: Self-pay | Admitting: Dermatology

## 2011-04-19 LAB — URINALYSIS, ROUTINE W REFLEX MICROSCOPIC
Glucose, UA: NEGATIVE
Hgb urine dipstick: NEGATIVE
Ketones, ur: NEGATIVE
Nitrite: NEGATIVE
Protein, ur: NEGATIVE
Urobilinogen, UA: 0.2

## 2011-04-19 LAB — CARDIAC PANEL(CRET KIN+CKTOT+MB+TROPI)
Relative Index: 1.9
Troponin I: 0.01
Troponin I: 0.01

## 2011-04-19 LAB — CBC
HCT: 38.8 — ABNORMAL LOW
MCHC: 34.4
Platelets: 212
RDW: 14.8

## 2011-04-19 LAB — HOMOCYSTEINE: Homocysteine: 7.3

## 2011-04-19 LAB — DIFFERENTIAL
Basophils Relative: 1
Lymphocytes Relative: 40
Lymphs Abs: 2.7
Monocytes Absolute: 0.6
Monocytes Relative: 9
Neutro Abs: 3.3
Neutrophils Relative %: 49

## 2011-04-19 LAB — POCT I-STAT, CHEM 8
Creatinine, Ser: 1
Glucose, Bld: 111 — ABNORMAL HIGH
Hemoglobin: 13.3
TCO2: 22

## 2011-04-19 LAB — COMPREHENSIVE METABOLIC PANEL
Alkaline Phosphatase: 47
BUN: 18
CO2: 26
Chloride: 107
GFR calc non Af Amer: >60
Glucose, Bld: 114 — ABNORMAL HIGH
Potassium: 3.9
Total Bilirubin: 0.4

## 2011-04-19 LAB — PROTIME-INR: Prothrombin Time: 22.7 — ABNORMAL HIGH

## 2011-04-19 LAB — HEMOGLOBIN A1C
Hgb A1c MFr Bld: 6.4 — ABNORMAL HIGH
Mean Plasma Glucose: 151

## 2011-04-19 LAB — RPR: RPR Ser Ql: NONREACTIVE

## 2011-04-19 LAB — LIPID PANEL
Cholesterol: 135
HDL: 38 — ABNORMAL LOW
Total CHOL/HDL Ratio: 3.6

## 2011-04-27 LAB — CBC
HCT: 39
Hemoglobin: 13.2
MCHC: 33
MCHC: 33.8
MCV: 93.1
MCV: 94.6
RBC: 4.2 — ABNORMAL LOW
RDW: 15.1

## 2011-04-27 LAB — BASIC METABOLIC PANEL
CO2: 24
Chloride: 108
Glucose, Bld: 95
Potassium: 4.1
Sodium: 140

## 2011-04-27 LAB — HEPARIN LEVEL (UNFRACTIONATED): Heparin Unfractionated: 0.76 — ABNORMAL HIGH

## 2011-05-05 LAB — COMPREHENSIVE METABOLIC PANEL
ALT: 19
AST: 21
Albumin: 3.5
Alkaline Phosphatase: 45
BUN: 13
BUN: 15
CO2: 27
Calcium: 8.3 — ABNORMAL LOW
Chloride: 102
Chloride: 109
Creatinine, Ser: 1.04
GFR calc Af Amer: >60
GFR calc non Af Amer: >60
Glucose, Bld: 105 — ABNORMAL HIGH
Glucose, Bld: 123 — ABNORMAL HIGH
Potassium: 3.8
Sodium: 135
Total Bilirubin: 0.8
Total Bilirubin: 0.9

## 2011-05-05 LAB — DIFFERENTIAL
Basophils Absolute: 0
Basophils Relative: 0
Eosinophils Absolute: 0.1
Monocytes Absolute: 0.6
Neutro Abs: 5.5
Neutrophils Relative %: 64

## 2011-05-05 LAB — HEMOGLOBIN A1C
Hgb A1c MFr Bld: 6.1
Mean Plasma Glucose: 140

## 2011-05-05 LAB — CARDIAC PANEL(CRET KIN+CKTOT+MB+TROPI)
CK, MB: 2.7
CK, MB: 3
Relative Index: 2.5
Total CK: 108
Troponin I: 0.01
Troponin I: <0.01

## 2011-05-05 LAB — TROPONIN I: Troponin I: <0.01

## 2011-05-05 LAB — CBC
HCT: 39.9
HCT: 42.8
Hemoglobin: 13.4
Hemoglobin: 14.4
MCHC: 33.5
MCHC: 33.7
MCV: 91.3
RBC: 4.37
RDW: 14.8 — ABNORMAL HIGH
WBC: 10

## 2011-05-05 LAB — PROTIME-INR
INR: 2.1 — ABNORMAL HIGH
INR: 2.5 — ABNORMAL HIGH
Prothrombin Time: 17.4 — ABNORMAL HIGH

## 2011-05-05 LAB — LIPID PANEL
LDL Cholesterol: 47
Triglycerides: 95
VLDL: 19

## 2011-05-05 LAB — URINALYSIS, ROUTINE W REFLEX MICROSCOPIC
Bilirubin Urine: NEGATIVE
Ketones, ur: NEGATIVE
Nitrite: NEGATIVE
pH: 5.5

## 2011-05-05 LAB — TSH: TSH: 1.368

## 2011-05-05 LAB — CK TOTAL AND CKMB (NOT AT ARMC): Relative Index: 2.8 — ABNORMAL HIGH

## 2011-05-05 LAB — APTT: aPTT: 38 — ABNORMAL HIGH

## 2011-07-15 ENCOUNTER — Other Ambulatory Visit: Payer: Self-pay | Admitting: Cardiology

## 2011-07-18 ENCOUNTER — Encounter (HOSPITAL_COMMUNITY): Payer: Self-pay | Admitting: Pharmacy Technician

## 2011-07-21 ENCOUNTER — Other Ambulatory Visit: Payer: Self-pay | Admitting: Cardiology

## 2011-07-21 ENCOUNTER — Ambulatory Visit
Admission: RE | Admit: 2011-07-21 | Discharge: 2011-07-21 | Disposition: A | Discharge status: Home / Self Care | Payer: Medicare Other | Source: Ambulatory Visit | Attending: Cardiology | Admitting: Cardiology

## 2011-07-21 DIAGNOSIS — R0602 Shortness of breath: Secondary | ICD-10-CM

## 2011-07-28 ENCOUNTER — Other Ambulatory Visit: Payer: Self-pay

## 2011-07-28 ENCOUNTER — Encounter (HOSPITAL_COMMUNITY)
Admission: RE | Disposition: A | Discharge status: Home / Self Care | Payer: Self-pay | Source: Ambulatory Visit | Attending: Cardiology

## 2011-07-28 ENCOUNTER — Encounter (HOSPITAL_COMMUNITY): Payer: Self-pay | Admitting: *Deleted

## 2011-07-28 ENCOUNTER — Ambulatory Visit (HOSPITAL_COMMUNITY)
Admission: RE | Admit: 2011-07-28 | Discharge: 2011-07-28 | Disposition: A | Discharge status: Home / Self Care | Payer: Medicare Other | Source: Ambulatory Visit | Attending: Cardiology | Admitting: Cardiology

## 2011-07-28 ENCOUNTER — Ambulatory Visit (HOSPITAL_COMMUNITY): Payer: Medicare Other | Admitting: *Deleted

## 2011-07-28 DIAGNOSIS — I4891 Unspecified atrial fibrillation: Secondary | ICD-10-CM | POA: Insufficient documentation

## 2011-07-28 HISTORY — PX: CARDIOVERSION: SHX1299

## 2011-07-28 LAB — PROTIME-INR
INR: 2.64 — ABNORMAL HIGH (ref 0.00–1.49)
Prothrombin Time: 28.6 seconds — ABNORMAL HIGH (ref 11.6–15.2)

## 2011-07-28 SURGERY — CARDIOVERSION
Anesthesia: Moderate Sedation | Wound class: Clean

## 2011-07-28 MED ORDER — PROPOFOL 10 MG/ML IV BOLUS
INTRAVENOUS | Status: DC | PRN
  Administered 2011-07-28: 10 mg via INTRAVENOUS
  Administered 2011-07-28: 30 mg via INTRAVENOUS

## 2011-07-28 MED ORDER — SODIUM CHLORIDE 0.45 % IV SOLN: INTRAVENOUS | Status: DC

## 2011-07-28 MED ORDER — HYDROCORTISONE 1 % EX CREA: 1.0000 "application " | TOPICAL_CREAM | Freq: Three times a day (TID) | CUTANEOUS | Status: DC | PRN

## 2011-07-28 NOTE — Procedures (Signed)
Electrical Cardioversion Procedure Note Bradley Nixon [id number] 22-Nov-1920  Procedure: Electrical Cardioversion Indications:  Atrial Fibrillation  Procedure Details Consent: Risks of procedure as well as the alternatives and risks of each were explained to the (patient/caregiver).  Consent for procedure obtained. Time Out: Verified patient identification, verified procedure, site/side was marked, verified correct patient position, special equipment/implants available, medications/allergies/relevent history reviewed, required imaging and test results available.  Performed  Patient placed on cardiac monitor, pulse oximetry, supplemental oxygen as necessary.  Sedation given: Propofol - total of 40mg  Pacer pads placed anterior and posterior chest.  Cardioverted 3 time(s).  Cardioverted at biphasic.  Evaluation Findings: Post procedure EKG shows: NSR, after 3rd shock Complications: None Patient did tolerate procedure well.   Plan: Continue Amiodarone @ current dose Standard post anesthesia monitoring. D/c Home once stable. F/u as scheduled with Dr. Clarene Nixon.   Bradley Nixon W 07/28/2011, 9:15 AM

## 2011-07-28 NOTE — Transfer of Care (Signed)
Immediate Anesthesia Transfer of Care Note  Patient: Bradley Nixon  Procedure(s) Performed:  CARDIOVERSION  Patient Location: PACU and Short Stay  Anesthesia Type: MAC  Level of Consciousness: awake  Airway & Oxygen Therapy: Patient Spontanous Breathing and Patient connected to nasal cannula oxygen  Post-op Assessment: Report given to PACU RN and Post -op Vital signs reviewed and stable  Post vital signs: Reviewed and stable  Complications: No apparent anesthesia complications

## 2011-07-28 NOTE — Anesthesia Postprocedure Evaluation (Signed)
  Anesthesia Post-op Note  Patient: Bradley Nixon  Procedure(s) Performed:  CARDIOVERSION  Patient Location: PACU and Short Stay  Anesthesia Type: MAC  Level of Consciousness: awake, alert  and oriented  Airway and Oxygen Therapy: Patient Spontanous Breathing and Patient connected to nasal cannula oxygen  Post-op Pain: none  Post-op Assessment: Post-op Vital signs reviewed and Patient's Cardiovascular Status Stable  Post-op Vital Signs: Reviewed and stable  Complications: No apparent anesthesia complications

## 2011-07-28 NOTE — H&P (Addendum)
History and Physical Interval Note:  NAME:  Bradley Nixon   MRN: [id number] DOB:  1921/05/24   ADMIT DATE: 07/28/2011   07/28/2011 8:53 AM  INTERVAL H&P  - refer [facility] note dated 07/06/2011.  Bradley Nixon is a 78 y.o. male is a former patient of Dr. Adelene Idler. His history of tachybradycardia syndrome with permanent pacer placement  in 2008. He underwent cardioversion in May 2012 for intermittent atrial fibrillation throughout thousand 11. He is being maintained in sinus rhythm on Sotalol until November 2012 are began noticing more tiredness fatigue and shortness breath as noted to be back into fibrillation during the interrogation of his pacemaker. He was switched from sotalol to amiodarone which is nonoperable and he's been on warfarin with a therapeutic INR.  All his symptoms did improve be on amiodarone he continues to have some shortness of breath activities be in atrial fibrillation.  Review of systems notable for arthritis pain in his neck as well as his fatigue and shortness breath. Otherwise negative and consistent.  Present history: Hypertension, atrial fibrillation, status post a permanent pacemaker placement for tachycardia bradycardia syndrome, nonocclusive coronary disease by cath in September 2009.  He has presented today for an outpatient procedure, with the diagnosis of Atrial Fibrillation, with a plan for elective cardioversion. His pacemaker interrogated doesn't does indeed show that his atrial fibrillation his EKG shows a paced rhythm only with no clear atrial rhythm.  The various methods of treatment have been discussed with the patient and family. After consideration of risks, benefits and other options for treatment, the patient has consented to Procedure(s):  Synchronized Direct-Current Cardioversion as an intervention.   The patients' history has been reviewed, patient examined, no change in status from most recent note, stable for surgery. I have reviewed the  patients' chart and labs. Questions were answered to the patient's satisfaction.   BP 129/83  Pulse 69  Temp(Src) 98.8 F (37.1 C) (Oral)  Resp 18  Ht 5\' 10"  (1.778 m)  Wt 77.111 kg (170 lb)  BMI 24.39 kg/m2  SpO2 96% General appearance: alert, cooperative, appears stated age and no distress Neck: no adenopathy, no carotid bruit, no JVD, supple, symmetrical, trachea midline and thyroid not enlarged, symmetric, no tenderness/mass/nodules Lungs: clear to auscultation bilaterally and non-labored Heart: regular rate and rhythm, S1, S2 normal, no murmur, click, rub or gallop and - paced rhythm Abdomen: soft, non-tender; bowel sounds normal; no masses,  no organomegaly Extremities: extremities normal, atraumatic, no cyanosis or edema Pulses: 2+ and symmetric Neurologic: Alert and oriented X 3, normal strength and tone. Normal symmetric reflexes. Normal coordination and gait INR 2.34  The patient will be seen by anesthesiology for sedation, and will reinterrogate his device post cardioversion to ensure that he is in sinus rhythm.  Bradley Nixon W [facility] [address] [place], [place]  [address]  [phone]  07/28/2011 8:53 AM

## 2011-07-28 NOTE — Preoperative (Signed)
Beta Blockers   Reason not to administer Beta Blockers:Pt took BB 07-27-11 in pm

## 2011-07-28 NOTE — Anesthesia Preprocedure Evaluation (Signed)
Anesthesia Evaluation  Patient identified by MRN, date of birth, ID band Patient awake    Reviewed: Allergy & Precautions, H&P , NPO status , Patient's Chart, lab work & pertinent test results  Airway Mallampati: I TM Distance: >3 FB Neck ROM: full    Dental   Pulmonary    Pulmonary exam normal       Cardiovascular Exercise Tolerance: Poor + dysrhythmias Atrial Fibrillation irregular Normal    Neuro/Psych Negative Neurological ROS     GI/Hepatic negative GI ROS, Neg liver ROS,   Endo/Other  Negative Endocrine ROS  Renal/GU negative Renal ROS  Genitourinary negative   Musculoskeletal   Abdominal   Peds  Hematology negative hematology ROS (+)   Anesthesia Other Findings   Reproductive/Obstetrics                           Anesthesia Physical Anesthesia Plan  ASA: III  Anesthesia Plan: General   Post-op Pain Management:    Induction: Intravenous  Airway Management Planned: Mask  Additional Equipment:   Intra-op Plan:   Post-operative Plan:   Informed Consent: I have reviewed the patients History and Physical, chart, labs and discussed the procedure including the risks, benefits and alternatives for the proposed anesthesia with the patient or authorized representative who has indicated his/her understanding and acceptance.     Plan Discussed with: CRNA, Anesthesiologist and Surgeon  Anesthesia Plan Comments:         Anesthesia Quick Evaluation

## 2011-07-29 ENCOUNTER — Encounter (HOSPITAL_COMMUNITY): Payer: Self-pay | Admitting: Cardiology

## 2012-01-19 ENCOUNTER — Other Ambulatory Visit: Payer: Self-pay | Admitting: Dermatology

## 2012-04-03 ENCOUNTER — Other Ambulatory Visit: Payer: Self-pay | Admitting: Dermatology

## 2012-04-23 ENCOUNTER — Ambulatory Visit: Payer: Self-pay | Admitting: Otolaryngology

## 2012-04-23 LAB — CBC WITH DIFFERENTIAL/PLATELET
Basophil #: 0 10*3/uL (ref 0.0–0.1)
Basophil %: 0.4 %
Eosinophil %: 2.1 %
HCT: 38.6 % — ABNORMAL LOW (ref 40.0–52.0)
Lymphocyte #: 2.1 10*3/uL (ref 1.0–3.6)
MCH: 32.4 pg (ref 26.0–34.0)
MCV: 96 fL (ref 80–100)
Monocyte %: 10.4 %
Neutrophil #: 3.9 10*3/uL (ref 1.4–6.5)
Platelet: 189 10*3/uL (ref 150–440)
RBC: 4.02 10*6/uL — ABNORMAL LOW (ref 4.40–5.90)
WBC: 7 10*3/uL (ref 3.8–10.6)

## 2012-04-23 LAB — BASIC METABOLIC PANEL
Anion Gap: 5 — ABNORMAL LOW (ref 7–16)
BUN: 18 mg/dL (ref 7–18)
Calcium, Total: 8.4 mg/dL — ABNORMAL LOW (ref 8.5–10.1)
Chloride: 108 mmol/L — ABNORMAL HIGH (ref 98–107)
Co2: 31 mmol/L (ref 21–32)
EGFR (African American): >60
Sodium: 144 mmol/L (ref 136–145)

## 2012-05-10 ENCOUNTER — Ambulatory Visit: Payer: Self-pay | Admitting: Otolaryngology

## 2012-05-10 LAB — PROTIME-INR
INR: 1
Prothrombin Time: 13.5 secs (ref 11.5–14.7)

## 2012-06-23 ENCOUNTER — Emergency Department (HOSPITAL_COMMUNITY): Payer: Medicare Other

## 2012-06-23 ENCOUNTER — Encounter (HOSPITAL_COMMUNITY): Payer: Self-pay | Admitting: Radiology

## 2012-06-23 ENCOUNTER — Observation Stay (HOSPITAL_COMMUNITY)
Admission: EM | Admit: 2012-06-23 | Discharge: 2012-06-26 | Disposition: A | Discharge status: Home / Self Care | Payer: Medicare Other | Attending: Internal Medicine | Admitting: Internal Medicine

## 2012-06-23 DIAGNOSIS — I219 Acute myocardial infarction, unspecified: Secondary | ICD-10-CM

## 2012-06-23 DIAGNOSIS — I252 Old myocardial infarction: Secondary | ICD-10-CM | POA: Insufficient documentation

## 2012-06-23 DIAGNOSIS — W19XXXA Unspecified fall, initial encounter: Secondary | ICD-10-CM | POA: Insufficient documentation

## 2012-06-23 DIAGNOSIS — S322XXA Fracture of coccyx, initial encounter for closed fracture: Secondary | ICD-10-CM | POA: Insufficient documentation

## 2012-06-23 DIAGNOSIS — S32599A Other specified fracture of unspecified pubis, initial encounter for closed fracture: Secondary | ICD-10-CM

## 2012-06-23 DIAGNOSIS — Z7982 Long term (current) use of aspirin: Secondary | ICD-10-CM | POA: Insufficient documentation

## 2012-06-23 DIAGNOSIS — S32509A Unspecified fracture of unspecified pubis, initial encounter for closed fracture: Principal | ICD-10-CM | POA: Insufficient documentation

## 2012-06-23 DIAGNOSIS — Z7902 Long term (current) use of antithrombotics/antiplatelets: Secondary | ICD-10-CM | POA: Insufficient documentation

## 2012-06-23 DIAGNOSIS — G319 Degenerative disease of nervous system, unspecified: Secondary | ICD-10-CM | POA: Insufficient documentation

## 2012-06-23 DIAGNOSIS — Z7901 Long term (current) use of anticoagulants: Secondary | ICD-10-CM | POA: Insufficient documentation

## 2012-06-23 DIAGNOSIS — M25559 Pain in unspecified hip: Secondary | ICD-10-CM | POA: Insufficient documentation

## 2012-06-23 DIAGNOSIS — S3210XA Unspecified fracture of sacrum, initial encounter for closed fracture: Secondary | ICD-10-CM | POA: Insufficient documentation

## 2012-06-23 DIAGNOSIS — S329XXA Fracture of unspecified parts of lumbosacral spine and pelvis, initial encounter for closed fracture: Secondary | ICD-10-CM

## 2012-06-23 DIAGNOSIS — Z79899 Other long term (current) drug therapy: Secondary | ICD-10-CM | POA: Insufficient documentation

## 2012-06-23 DIAGNOSIS — E039 Hypothyroidism, unspecified: Secondary | ICD-10-CM | POA: Insufficient documentation

## 2012-06-23 DIAGNOSIS — S32519A Fracture of superior rim of unspecified pubis, initial encounter for closed fracture: Secondary | ICD-10-CM

## 2012-06-23 DIAGNOSIS — Z950 Presence of cardiac pacemaker: Secondary | ICD-10-CM | POA: Insufficient documentation

## 2012-06-23 HISTORY — DX: Acute myocardial infarction, unspecified: I21.9

## 2012-06-23 HISTORY — DX: Hypothyroidism, unspecified: E03.9

## 2012-06-23 HISTORY — DX: Other complications of anesthesia, initial encounter: T88.59XA

## 2012-06-23 HISTORY — DX: Unspecified osteoarthritis, unspecified site: M19.90

## 2012-06-23 HISTORY — DX: Pneumonia, unspecified organism: J18.9

## 2012-06-23 HISTORY — DX: Adverse effect of unspecified anesthetic, initial encounter: T41.45XA

## 2012-06-23 HISTORY — DX: Atherosclerotic heart disease of native coronary artery without angina pectoris: I25.10

## 2012-06-23 HISTORY — DX: Shortness of breath: R06.02

## 2012-06-23 HISTORY — DX: Presence of cardiac pacemaker: Z95.0

## 2012-06-23 HISTORY — DX: Malignant (primary) neoplasm, unspecified: C80.1

## 2012-06-23 HISTORY — DX: Angina pectoris, unspecified: I20.9

## 2012-06-23 LAB — URINALYSIS, ROUTINE W REFLEX MICROSCOPIC
Bilirubin Urine: NEGATIVE
Glucose, UA: NEGATIVE mg/dL
Hgb urine dipstick: NEGATIVE
Ketones, ur: 15 mg/dL — AB
Protein, ur: NEGATIVE mg/dL
Urobilinogen, UA: 0.2 mg/dL (ref 0.0–1.0)

## 2012-06-23 LAB — CBC WITH DIFFERENTIAL/PLATELET
Basophils Absolute: 0 10*3/uL (ref 0.0–0.1)
Eosinophils Absolute: 0.1 10*3/uL (ref 0.0–0.7)
Eosinophils Relative: 1 % (ref 0–5)
MCH: 31.2 pg (ref 26.0–34.0)
MCV: 94.9 fL (ref 78.0–100.0)
Platelets: 171 10*3/uL (ref 150–400)
RDW: 14.3 % (ref 11.5–15.5)
WBC: 11.1 10*3/uL — ABNORMAL HIGH (ref 4.0–10.5)

## 2012-06-23 LAB — DIGOXIN LEVEL: Digoxin Level: 0.8 ng/mL (ref 0.8–2.0)

## 2012-06-23 LAB — BASIC METABOLIC PANEL
Calcium: 8.7 mg/dL (ref 8.4–10.5)
GFR calc non Af Amer: 73 mL/min — ABNORMAL LOW (ref >90)
Sodium: 137 mEq/L (ref 135–145)

## 2012-06-23 MED ORDER — ONDANSETRON HCL 4 MG/2ML IJ SOLN
4.0000 mg | Freq: Four times a day (QID) | INTRAMUSCULAR | Status: DC | PRN
  Administered 2012-06-23: 4 mg via INTRAVENOUS
  Filled 2012-06-23 (×2): qty 2

## 2012-06-23 MED ORDER — HYDROMORPHONE HCL PF 1 MG/ML IJ SOLN
0.5000 mg | Freq: Once | INTRAMUSCULAR | Status: AC
  Administered 2012-06-23: 0.5 mg via INTRAVENOUS
  Filled 2012-06-23: qty 1

## 2012-06-23 MED ORDER — HYDROMORPHONE HCL PF 1 MG/ML IJ SOLN
0.5000 mg | INTRAMUSCULAR | Status: DC | PRN
Start: 2012-06-23 — End: 2012-06-24
  Administered 2012-06-24: 0.5 mg via INTRAVENOUS
  Filled 2012-06-23: qty 1

## 2012-06-23 NOTE — ED Notes (Signed)
Pt removed from KED at this time

## 2012-06-23 NOTE — ED Notes (Signed)
Per Merian Capron pt can talk but cannot walk. RN states that patient "looked unresponsive in bed" Per facility pt would not respond to speech and tactile stimulation. Pt's B/P was over 200.  Staff states that she had similar issues with prior UTI

## 2012-06-23 NOTE — ED Notes (Signed)
MD at bedside. 

## 2012-06-23 NOTE — ED Notes (Addendum)
Pt presents with injuries related to fall from standing onto cement pad at home. Pt has external rotation to left leg and pain to right hip. Pt reporting lower back and hit is head. Pt is on coumadin.  Pt denies any LOC. Pt received of fentanyl enroute

## 2012-06-23 NOTE — ED Provider Notes (Signed)
History     CSN: [id number]  Arrival date & time 06/23/12  1546   First MD Initiated Contact with Patient 06/23/12 1554      Chief Complaint  Patient presents with  . Fall  . Hip Pain    (Consider location/radiation/quality/duration/timing/severity/associated sxs/prior treatment) Patient is a 78 y.o. male presenting with fall and hip pain. The history is provided by the patient, the spouse and the EMS personnel.  Fall  Hip Pain   Patient here after mechanical fall at home after he tripped and fell onto his right hip. No loss of consciousness. Denies any neck pain. No upper or lower extremities paresthesias. He was able to ambulate after the fall. Pain at the hip is characterized as sharp and worse with movement. Denies any chest pain or shortness of breath. Denies abdominal pain. EMS was called and given fentanyl l 50 mcg for pain and transported here History reviewed. No pertinent past medical history.  Past Surgical History  Procedure Date  . Cardioversion 07/28/2011    Procedure: CARDIOVERSION;  Surgeon: Marykay Lex;  Location: [facility] OR;  Service: Cardiovascular;  Laterality: N/A;    History reviewed. No pertinent family history.  History  Substance Use Topics  . Smoking status: Not on file  . Smokeless tobacco: Not on file  . Alcohol Use: Not on file      Review of Systems  All other systems reviewed and are negative.    Allergies  Review of patient's allergies indicates no known allergies.  Home Medications   Current Outpatient Rx  Name  Route  Sig  Dispense  Refill  . AMIODARONE HCL 200 MG PO TABS   Oral   Take 200 mg by mouth every morning.           . ASPIRIN EC 81 MG PO TBEC   Oral   Take 81 mg by mouth every morning.           . ATORVASTATIN CALCIUM 10 MG PO TABS   Oral   Take 10 mg by mouth every evening.           Marland Kitchen DIGOXIN 0.125 MG PO TABS   Oral   Take 125 mcg by mouth every morning.           Marland Kitchen FINASTERIDE 5 MG PO TABS  Oral   Take 5 mg by mouth every evening.           Marland Kitchen OMEGA-3 FATTY ACIDS 1000 MG PO CAPS   Oral   Take 1 g by mouth every evening.           Marland Kitchen PREVACID PO   Oral   Take 1 capsule by mouth every morning.           Marland Kitchen METOPROLOL SUCCINATE ER 50 MG PO TB24   Oral   Take 25 mg by mouth 2 (two) times daily.           . ADULT MULTIVITAMIN W/MINERALS CH   Oral   Take 1 tablet by mouth every evening.           Marland Kitchen SIMVASTATIN 20 MG PO TABS   Oral   Take 20 mg by mouth at bedtime.           . TAMSULOSIN HCL 0.4 MG PO CAPS   Oral   Take 0.4 mg by mouth every evening.           . WARFARIN SODIUM 5 MG PO TABS  Oral   Take 5-7.5 mg by mouth See admin instructions. On Mon, Wed, Fri, Sat take 1 tab (5mg ) & on all other days take 1.5 tab (7.5mg )            SpO2 94%  Physical Exam  Nursing note and vitals reviewed. Constitutional: He is oriented to person, place, and time. He appears well-developed and well-nourished.  Non-toxic appearance. No distress.  HENT:  Head: Normocephalic and atraumatic.  Eyes: Conjunctivae normal, EOM and lids are normal. Pupils are equal, round, and reactive to light.  Neck: Normal range of motion. Neck supple. No spinous process tenderness and no muscular tenderness present. No tracheal deviation present. No mass present.  Cardiovascular: Normal rate, regular rhythm and normal heart sounds.  Exam reveals no gallop.   No murmur heard. Pulmonary/Chest: Effort normal and breath sounds normal. No stridor. No respiratory distress. He has no decreased breath sounds. He has no wheezes. He has no rhonchi. He has no rales.  Abdominal: Soft. Normal appearance and bowel sounds are normal. He exhibits no distension. There is no tenderness. There is no rebound and no CVA tenderness.  Musculoskeletal: Normal range of motion. He exhibits no edema and no tenderness.       Right hip shortened and rotated. Dorsalis pedis pulses 2+. Neurovascular status is  intact.  Neurological: He is alert and oriented to person, place, and time. He has normal strength. No cranial nerve deficit or sensory deficit. GCS eye subscore is 4. GCS verbal subscore is 5. GCS motor subscore is 6.  Skin: Skin is warm and dry. No abrasion and no rash noted.  Psychiatric: He has a normal mood and affect. His speech is normal and behavior is normal.    ED Course  Procedures (including critical care time)  Labs Reviewed - No data to display No results found.   No diagnosis found.    MDM  Patient given medications for pain here. CT results negative of his head but positive multiple pelvic fractures. He will be admitted by triad hospitalist. A call to his orthopedic surgeon has been made and is pending at this time. The hospitalist has been informed of this        Toy Baker, MD 06/23/12 2053

## 2012-06-24 ENCOUNTER — Encounter (HOSPITAL_COMMUNITY): Payer: Self-pay | Admitting: *Deleted

## 2012-06-24 DIAGNOSIS — S329XXA Fracture of unspecified parts of lumbosacral spine and pelvis, initial encounter for closed fracture: Secondary | ICD-10-CM

## 2012-06-24 DIAGNOSIS — X58XXXA Exposure to other specified factors, initial encounter: Secondary | ICD-10-CM

## 2012-06-24 DIAGNOSIS — S32519A Fracture of superior rim of unspecified pubis, initial encounter for closed fracture: Secondary | ICD-10-CM | POA: Diagnosis present

## 2012-06-24 DIAGNOSIS — W19XXXA Unspecified fall, initial encounter: Secondary | ICD-10-CM

## 2012-06-24 DIAGNOSIS — I219 Acute myocardial infarction, unspecified: Secondary | ICD-10-CM

## 2012-06-24 DIAGNOSIS — Z950 Presence of cardiac pacemaker: Secondary | ICD-10-CM

## 2012-06-24 DIAGNOSIS — S32509A Unspecified fracture of unspecified pubis, initial encounter for closed fracture: Secondary | ICD-10-CM

## 2012-06-24 DIAGNOSIS — S3210XA Unspecified fracture of sacrum, initial encounter for closed fracture: Secondary | ICD-10-CM

## 2012-06-24 DIAGNOSIS — S32599A Other specified fracture of unspecified pubis, initial encounter for closed fracture: Secondary | ICD-10-CM | POA: Diagnosis present

## 2012-06-24 DIAGNOSIS — E039 Hypothyroidism, unspecified: Secondary | ICD-10-CM

## 2012-06-24 LAB — BASIC METABOLIC PANEL
BUN: 14 mg/dL (ref 6–23)
Calcium: 8.5 mg/dL (ref 8.4–10.5)
Chloride: 105 mEq/L (ref 96–112)
Creatinine, Ser: 0.8 mg/dL (ref 0.50–1.35)
GFR calc Af Amer: 89 mL/min — ABNORMAL LOW (ref >90)
GFR calc non Af Amer: 77 mL/min — ABNORMAL LOW (ref >90)

## 2012-06-24 LAB — CBC
HCT: 40.8 % (ref 39.0–52.0)
MCH: 31.5 pg (ref 26.0–34.0)
MCHC: 32.8 g/dL (ref 30.0–36.0)
MCV: 96 fL (ref 78.0–100.0)
Platelets: 182 10*3/uL (ref 150–400)
RDW: 14.4 % (ref 11.5–15.5)

## 2012-06-24 MED ORDER — LEVOTHYROXINE SODIUM 50 MCG PO TABS
50.0000 ug | ORAL_TABLET | Freq: Every day | ORAL | Status: DC
  Administered 2012-06-24 – 2012-06-25 (×2): 50 ug via ORAL
  Filled 2012-06-24 (×4): qty 1

## 2012-06-24 MED ORDER — DOCUSATE SODIUM 100 MG PO CAPS
100.0000 mg | ORAL_CAPSULE | Freq: Two times a day (BID) | ORAL | Status: DC
  Administered 2012-06-24 – 2012-06-25 (×4): 100 mg via ORAL
  Filled 2012-06-24 (×6): qty 1

## 2012-06-24 MED ORDER — OMEGA-3 FATTY ACIDS 1000 MG PO CAPS: 1.0000 g | ORAL_CAPSULE | Freq: Every evening | ORAL | Status: DC

## 2012-06-24 MED ORDER — SIMVASTATIN 20 MG PO TABS: 20.0000 mg | ORAL_TABLET | Freq: Every day | ORAL | Status: DC

## 2012-06-24 MED ORDER — MORPHINE SULFATE 2 MG/ML IJ SOLN
1.0000 mg | INTRAMUSCULAR | Status: DC | PRN
Start: 2012-06-24 — End: 2012-06-26
  Filled 2012-06-24: qty 1

## 2012-06-24 MED ORDER — TAMSULOSIN HCL 0.4 MG PO CAPS
0.4000 mg | ORAL_CAPSULE | Freq: Every evening | ORAL | Status: DC
  Administered 2012-06-24 – 2012-06-25 (×2): 0.4 mg via ORAL
  Filled 2012-06-24 (×3): qty 1

## 2012-06-24 MED ORDER — AMIODARONE HCL 200 MG PO TABS
200.0000 mg | ORAL_TABLET | Freq: Every day | ORAL | Status: DC
  Administered 2012-06-24 – 2012-06-26 (×3): 200 mg via ORAL
  Filled 2012-06-24 (×3): qty 1

## 2012-06-24 MED ORDER — WARFARIN SODIUM 2.5 MG PO TABS: 2.5000 mg | ORAL_TABLET | ORAL | Status: DC

## 2012-06-24 MED ORDER — DIGOXIN 125 MCG PO TABS
125.0000 ug | ORAL_TABLET | ORAL | Status: DC
  Administered 2012-06-24 – 2012-06-26 (×3): 125 ug via ORAL
  Filled 2012-06-24 (×4): qty 1

## 2012-06-24 MED ORDER — WARFARIN SODIUM 2.5 MG PO TABS
2.5000 mg | ORAL_TABLET | ORAL | Status: DC
  Administered 2012-06-25: 2.5 mg via ORAL
  Filled 2012-06-24: qty 1

## 2012-06-24 MED ORDER — SODIUM CHLORIDE 0.9 % IJ SOLN
3.0000 mL | Freq: Two times a day (BID) | INTRAMUSCULAR | Status: DC
  Administered 2012-06-25: 3 mL via INTRAVENOUS

## 2012-06-24 MED ORDER — WARFARIN - PHYSICIAN DOSING INPATIENT
Freq: Every day | Status: DC
  Administered 2012-06-24: 18:00:00

## 2012-06-24 MED ORDER — WARFARIN SODIUM 5 MG PO TABS
5.0000 mg | ORAL_TABLET | ORAL | Status: DC
  Administered 2012-06-24: 5 mg via ORAL
  Filled 2012-06-24 (×2): qty 1

## 2012-06-24 MED ORDER — SODIUM CHLORIDE 0.9 % IJ SOLN: 3.0000 mL | INTRAMUSCULAR | Status: DC | PRN

## 2012-06-24 MED ORDER — ATORVASTATIN CALCIUM 10 MG PO TABS
10.0000 mg | ORAL_TABLET | Freq: Every evening | ORAL | Status: DC
Start: 2012-06-24 — End: 2012-06-26
  Administered 2012-06-24 – 2012-06-25 (×2): 10 mg via ORAL
  Filled 2012-06-24 (×3): qty 1

## 2012-06-24 MED ORDER — HYDROCODONE-ACETAMINOPHEN 5-325 MG PO TABS
1.0000 | ORAL_TABLET | ORAL | Status: DC | PRN
Start: 2012-06-24 — End: 2012-06-26
  Administered 2012-06-24 – 2012-06-25 (×3): 1 via ORAL
  Filled 2012-06-24 (×3): qty 1

## 2012-06-24 MED ORDER — METOPROLOL SUCCINATE ER 50 MG PO TB24
50.0000 mg | ORAL_TABLET | Freq: Every day | ORAL | Status: DC
  Administered 2012-06-24 – 2012-06-26 (×3): 50 mg via ORAL
  Filled 2012-06-24 (×3): qty 1

## 2012-06-24 MED ORDER — FINASTERIDE 5 MG PO TABS
5.0000 mg | ORAL_TABLET | Freq: Every evening | ORAL | Status: DC
  Administered 2012-06-24 – 2012-06-25 (×2): 5 mg via ORAL
  Filled 2012-06-24 (×3): qty 1

## 2012-06-24 MED ORDER — SODIUM CHLORIDE 0.9 % IJ SOLN
3.0000 mL | Freq: Two times a day (BID) | INTRAMUSCULAR | Status: DC
  Administered 2012-06-24 – 2012-06-25 (×3): 3 mL via INTRAVENOUS

## 2012-06-24 MED ORDER — POLYETHYLENE GLYCOL 3350 17 G PO PACK
17.0000 g | PACK | Freq: Every day | ORAL | Status: DC
  Administered 2012-06-24 – 2012-06-26 (×3): 17 g via ORAL
  Filled 2012-06-24 (×3): qty 1

## 2012-06-24 MED ORDER — SODIUM CHLORIDE 0.9 % IV SOLN: 250.0000 mL | INTRAVENOUS | Status: DC | PRN

## 2012-06-24 MED ORDER — OMEGA-3-ACID ETHYL ESTERS 1 G PO CAPS
1.0000 g | ORAL_CAPSULE | Freq: Every day | ORAL | Status: DC
  Administered 2012-06-24 – 2012-06-25 (×2): 1 g via ORAL
  Filled 2012-06-24 (×3): qty 1

## 2012-06-24 MED ORDER — ADULT MULTIVITAMIN W/MINERALS CH
1.0000 | ORAL_TABLET | Freq: Every evening | ORAL | Status: DC
  Administered 2012-06-24 – 2012-06-25 (×2): 1 via ORAL
  Filled 2012-06-24 (×3): qty 1

## 2012-06-24 NOTE — Progress Notes (Addendum)
[facility]                                                                                Patient Demographics  Bradley Nixon, is a 78 y.o. male  ZOX:[id number]  [id number]:[id number]  DOB - 1920-08-20  Admit date - 06/23/2012  Admitting Physician Hillary Bow, DO  Outpatient Primary MD for the patient is Nixon, Bradley Ammons, MD  LOS - 1   Chief Complaint  Patient presents with  . Fall  . Hip Pain        Assessment & Plan    Principal Problem:   1. Fall with R. Sided Fracture of inferior & superior pubic ramus - case was D/W by admitting MD with Ortho, on the Right leg toe toch only, PT to see, pain meds adjsuted.   2.H/O CAD,MI, Afib (Dr Herbie [place])  and Pacemaker - asymptomatic, home meds continue.    3.Hypothyroidism - continue home dose synthroid.   Code Status: Full  Family Communication: with patient  Disposition Plan: TBD    Procedures None   Consults  Ortho over phone   Time Spent in minutes   35   Antibiotics     Anti-infectives    None      Scheduled Meds:   . amiodarone  200 mg Oral Daily  . atorvastatin  10 mg Oral QPM  . digoxin  125 mcg Oral BH-q7a  . docusate sodium  100 mg Oral BID  . finasteride  5 mg Oral QPM  . [COMPLETED] HYDROmorphone  0.5 mg Intravenous Once  . [COMPLETED] HYDROmorphone  0.5 mg Intravenous Once  . [COMPLETED] HYDROmorphone  0.5 mg Intravenous Once  . levothyroxine  50 mcg Oral QAC breakfast  . metoprolol succinate  50 mg Oral Daily  . multivitamin with minerals  1 tablet Oral QPM  . omega-3 acid ethyl esters  1 g Oral QAC supper  . polyethylene glycol  17 g Oral Daily  . sodium chloride  3 mL Intravenous Q12H  . sodium chloride  3 mL Intravenous Q12H  . Tamsulosin HCl  0.4 mg Oral QPM  . warfarin  5 mg Oral Custom   And  . warfarin  2.5 mg Oral Q M,W,F,Sa-1800  . Warfarin - Physician Dosing Inpatient   Does not apply q1800  . [DISCONTINUED] fish oil-omega-3 fatty acids  1 g Oral  QPM  . [DISCONTINUED] simvastatin  20 mg Oral QHS  . [DISCONTINUED] warfarin  2.5-5 mg Oral See admin instructions   Continuous Infusions:  PRN Meds:.sodium chloride, HYDROcodone-acetaminophen, morphine injection, ondansetron (ZOFRAN) IV, sodium chloride, [DISCONTINUED]  HYDROmorphone (DILAUDID) injection   DVT Prophylaxis  Coumadin  Lab Results  Component Value Date   INR 2.38* 06/23/2012   INR 2.64* 07/28/2011   INR 3.32* 12/09/2010     Lab Results  Component Value Date   PLT 182 06/24/2012      SINGH,PRASHANT K M.D on 06/24/2012 at 1:20 PM  Between 7am to 7pm - Pager - [phone]  After 7pm go to [email or web address] - password [email or web address]  And look for the night coverage person covering for me after hours  [facility] Office  [phone]  Subjective:   Bradley Nixon today has, No headache, No chest pain, No abdominal pain - No Nausea, No new weakness tingling or numbness, No Cough - SOB.+ve Rt sided pelvic pain.  Objective:   Filed Vitals:   06/24/12 0315 06/24/12 0346 06/24/12 0402 06/24/12 1256  BP: 131/64 137/65  115/50  Pulse: 65 60  60  Temp:  98.7 F (37.1 C)  98.3 F (36.8 C)  TempSrc:      Resp:  18  16  Height:   5\' 8"  (1.727 m)   Weight:   77.792 kg (171 lb 8 oz)   SpO2: 95% 97%  96%    Wt Readings from Last 3 Encounters:  06/24/12 77.792 kg (171 lb 8 oz)  07/28/11 77.111 kg (170 lb)  07/28/11 77.111 kg (170 lb)     Intake/Output Summary (Last 24 hours) at 06/24/12 1320 Last data filed at 06/24/12 0700  Gross per 24 hour  Intake      2 ml  Output      0 ml  Net      2 ml    Exam Awake Alert, Oriented X 3, No new F.N deficits, Normal affect [place].AT,PERRAL Supple Neck,No JVD, No cervical lymphadenopathy appriciated.  Symmetrical Chest wall movement, Good air movement bilaterally, CTAB RRR,No Gallops,Rubs or new Murmurs, No Parasternal Heave +ve B.Sounds, Abd Soft, Non tender, No organomegaly appriciated, No rebound - guarding or  rigidity. No Cyanosis, Clubbing or edema, No new Rash or bruise      Data Review   Micro Results No results found for this or any previous visit (from the past 240 hour(s)).  Radiology Reports Dg Chest 2 View  06/23/2012  *RADIOLOGY REPORT*  Clinical Data: Larey Seat and injured right hip approximately 2 hours ago.  Indwelling pacemaker.  CHEST - 2 VIEW  Comparison: Two-view chest x-ray 07/21/2011, 12/03/2010, 05/17/2007.  Findings: Cardiac silhouette mildly enlarged but stable.  Thoracic aorta atherosclerotic, unchanged.  Hilar and mediastinal contours otherwise unremarkable.  Emphysematous changes in the upper lobes. Chronic scarring in the lower lobes, left greater than right, unchanged.  No new pulmonary parenchymal abnormalities.  Right subclavian dual lead transvenous pacemaker unchanged appears intact. Degenerative changes and DISH involving the thoracic spine.  IMPRESSION: Stable cardiomegaly without pulmonary edema.  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  No acute cardiopulmonary disease.   Original Report Authenticated By: Hulan Saas, M.D.    Dg Hip Complete Right  06/23/2012  *RADIOLOGY REPORT*  Clinical Data: Larey Seat and injured right hip approximately 2 hours ago.  RIGHT HIP - COMPLETE 2+ VIEW  Comparison: None.  Findings: Nondisplaced fracture involving the right superior pubic ramus.  No other fractures about the right hip or elsewhere in the pelvis.  Moderate joint space narrowing in the right hip with mild protrusio deformity.  Mild joint space narrowing in the contralateral left hip. Sacroiliac joints and symphysis pubis intact with degenerative changes.  Degenerative changes involving the visualized lower lumbar spine.  IMPRESSION: Nondisplaced fracture involving the right superior pubic ramus.  No other fractures.   Original Report Authenticated By: Hulan Saas, M.D.    Ct Head Wo Contrast  06/23/2012  *RADIOLOGY REPORT*  Clinical Data: Fall.  CT HEAD WITHOUT  CONTRAST  Technique:  Contiguous axial images were obtained from the base of the skull through the vertex without contrast.  Comparison: Unenhanced cranial CT 11/18/2007, 04/29/2007.  Findings: Patient motion blurred images of the skull bases but these were repeated and a diagnostic study was  obtained.  Ventricular system normal in size and appearance for age.  Severe cortical atrophy which has progressed since the prior examinations. Mild changes of small vessel disease of the white matter diffusely, also progressive.  Old lacunar stroke in the head of the left caudate nucleus and in the mid left basal ganglia, unchanged.  No mass lesion.  No midline shift.  No acute hemorrhage or hematoma. No extra-axial fluid collections.  No evidence of acute infarction. No significant interval change.  No skull fracture or other focal osseous abnormality involving the skull.  Visualized paranasal sinuses, bilateral mastoid air cells, and bilateral middle ear cavities well-aerated.  Bilateral carotid siphon atherosclerosis.  Bony nasal septal deviation to the left.  IMPRESSION:  1.  No acute intracranial abnormality. 2.  Severe cortical atrophy, progressive since 2009. 3.  Stable mild chronic microvascular ischemic changes of the white matter. 4.  Stable old lacunar strokes in the left basal ganglia.   Original Report Authenticated By: Hulan Saas, M.D.    Ct Hip Right Wo Contrast  06/23/2012  *RADIOLOGY REPORT*  Clinical Data: Fall.  Hip pain  CT OF THE RIGHT HIP WITHOUT CONTRAST  Technique:  Multidetector CT imaging was performed according to the standard protocol. Multiplanar CT image reconstructions were also generated.  Comparison: Pelvis 06/23/2012  Findings: Nondisplaced fracture of the right anterior sacrum.  SI joint is intact.  Fractures of the right superior and inferior pubic rami.  The superior pubic ramus fracture extends to the anterior acetabulum but does not appear to extend into the joint.  No fracture  of the femur.  Mild cartilage loss and spurring involving the right hip joint.  No fractures in the left pelvis are identified.  IMPRESSION: Fracture of the right superior pubic ramus extending into the anterior acetabulum.  Fractures are also present of the inferior pubic ramus on the right  and  the right anterior sacrum.   Original Report Authenticated By: Janeece Riggers, M.D.     CBC  Lab 06/24/12 0500 06/23/12 1558  WBC 8.1 11.1*  HGB 13.4 13.5  HCT 40.8 41.1  PLT 182 171  MCV 96.0 94.9  MCH 31.5 31.2  MCHC 32.8 32.8  RDW 14.4 14.3  LYMPHSABS -- 2.0  MONOABS -- 1.0  EOSABS -- 0.1  BASOSABS -- 0.0  BANDABS -- --    Chemistries   Lab 06/24/12 0500 06/23/12 1558  NA 140 137  K 4.2 3.9  CL 105 103  CO2 26 25  GLUCOSE 109* 123*  BUN 14 16  CREATININE 0.80 0.91  CALCIUM 8.5 8.7  MG -- --  AST -- --  ALT -- --  ALKPHOS -- --  BILITOT -- --   ------------------------------------------------------------------------------------------------------------------ estimated creatinine clearance is 60.6 ml/min (by C-G formula based on Cr of 0.8). ------------------------------------------------------------------------------------------------------------------ No results found for this basename: HGBA1C:2 in the last 72 hours ------------------------------------------------------------------------------------------------------------------ No results found for this basename: CHOL:2,HDL:2,LDLCALC:2,TRIG:2,CHOLHDL:2,LDLDIRECT:2 in the last 72 hours ------------------------------------------------------------------------------------------------------------------ No results found for this basename: TSH,T4TOTAL,FREET3,T3FREE,THYROIDAB in the last 72 hours ------------------------------------------------------------------------------------------------------------------ No results found for this basename: VITAMINB12:2,FOLATE:2,FERRITIN:2,TIBC:2,IRON:2,RETICCTPCT:2 in the last 72  hours  Coagulation profile  Lab 06/23/12 1558  INR 2.38*  PROTIME --    No results found for this basename: DDIMER:2 in the last 72 hours  Cardiac Enzymes No results found for this basename: CK:3,CKMB:3,TROPONINI:3,MYOGLOBIN:3 in the last 168 hours ------------------------------------------------------------------------------------------------------------------ No components found with this basename: POCBNP:3

## 2012-06-24 NOTE — Evaluation (Signed)
Physical Therapy Evaluation Patient Details Name: Bradley Nixon MRN: [id number] DOB: [birth year] Today's Date: 06/24/2012 Time: 4098-1191 PT Time Calculation (min): 24 min  PT Assessment / Plan / Recommendation Clinical Impression  Pt was a very active 78 y.o. male PTA.  He sustained a pelvic fx from a fall 06/23/12.  Pt's desire to to return home with HHPT, but discussed at length with pt re: possible need for ST SNF prior to d/c home.  PT indicated to progress pt to max functional level for LTG of return home with wife.    PT Assessment  Patient needs continued PT services    Follow Up Recommendations  Home health PT;SNF;Supervision for mobility/OOB    Does the patient have the potential to tolerate intense rehabilitation      Barriers to Discharge None      Equipment Recommendations  3 in 1 bedside comode    Recommendations for Other Services     Frequency Min 6X/week    Precautions / Restrictions Restrictions RLE Weight Bearing: Touchdown weight bearing   Pertinent Vitals/Pain       Mobility  Bed Mobility Supine to Sit: 1: +2 Total assist;With rails;HOB elevated Supine to Sit: Patient Percentage: 40% Transfers Sit to Stand: 1: +2 Total assist;From bed;With upper extremity assist Sit to Stand: Patient Percentage: 50% Stand to Sit: To chair/3-in-1;With armrests Details for Transfer Assistance: verbal cues for sequencing Ambulation/Gait Ambulation/Gait Assistance: 4: Min assist Ambulation Distance (Feet): 8 Feet Assistive device: Rolling walker Ambulation/Gait Assistance Details: verbal cues for sequencing, TDWB Gait Pattern: Step-to pattern;Antalgic Gait velocity: decreased    Shoulder Instructions     Exercises     PT Diagnosis: Difficulty walking;Acute pain  PT Problem List: Decreased mobility;Pain;Decreased activity tolerance PT Treatment Interventions: DME instruction;Gait training;Stair training;Functional mobility training;Therapeutic  activities;Patient/family education;Therapeutic exercise   PT Goals Acute Rehab PT Goals PT Goal Formulation: With patient Time For Goal Achievement: 07/01/12 Potential to Achieve Goals: Good Pt will go Supine/Side to Sit: with modified independence;with HOB 0 degrees PT Goal: Supine/Side to Sit - Progress: Goal set today Pt will go Sit to Supine/Side: with modified independence;with HOB 0 degrees PT Goal: Sit to Supine/Side - Progress: Goal set today Pt will go Sit to Stand: with supervision;with upper extremity assist PT Goal: Sit to Stand - Progress: Goal set today Pt will go Stand to Sit: with supervision;with upper extremity assist PT Goal: Stand to Sit - Progress: Goal set today Pt will Ambulate: 51 - 150 feet;with supervision;with rolling walker PT Goal: Ambulate - Progress: Goal set today Pt will Go Up / Down Stairs: 3-5 stairs;with supervision;with rail(s) PT Goal: Up/Down Stairs - Progress: Goal set today  Visit Information  Last PT Received On: 06/24/12 Assistance Needed: +1    Subjective Data  Subjective: "I want to be able to go home." Patient Stated Goal: home with wife   Prior Functioning  Home Living Lives With: Spouse Available Help at Discharge: Family;Available 24 hours/day Type of Home: House Home Access: Stairs to enter [employer] of Steps: 4 Entrance Stairs-Rails: Right;Left;Can reach both Home Layout: One level;Laundry or work area in [employer] Equipment: Environmental consultant - rolling Prior Function Level of Independence: Independent Able to Take Stairs?: Yes Driving: Yes Vocation: Retired Musician: Clinical cytogeneticist  Overall Cognitive Status: Appears within functional limits for tasks assessed/performed Arousal/Alertness: Awake/alert Orientation Level: Appears intact for tasks assessed Behavior During Session: [facility] for tasks performed    Extremity/Trunk Assessment  Balance    End of Session PT - End of  Session Equipment Utilized During Treatment: Gait belt Activity Tolerance: Patient limited by pain Patient left: in chair;with call bell/phone within reach  GP     Ilda Foil 06/24/2012, 12:43 PM  Aida Raider, PT  Office # [phone] Pager [phone]

## 2012-06-24 NOTE — H&P (Signed)
[facility] History and Physical  GEE HABIG WUJ:[id number] DOB: 03-Apr-1921 DOA: 06/23/2012  Referring physician: ED PCP: Lorenda Peck, MD  Specialists: None  Chief Complaint: Fall, R hip pain  HPI: Bradley Nixon is a 78 y.o. male who suffered a mechanical fall earlier this evening.  After the fall he was able to with great pain ambulate.  Pain is located in his R hip and back area.  Movement and weight bearing make it worse, rest makes it better.  Fall was not associated with hemiparesis nor LOC.  In the ED he was found to have R pubic rami fractures and a R sacral fracture.  Hospitalist has been asked to admit.  Review of Systems: 12 systems reviewed and negative.  History reviewed. No pertinent past medical history. Past Surgical History  Procedure Date  . Cardioversion 07/28/2011    Procedure: CARDIOVERSION;  Surgeon: Marykay Lex;  Location: [facility] OR;  Service: Cardiovascular;  Laterality: N/A;   Social History:  does not have a smoking history on file. He does not have any smokeless tobacco history on file. His alcohol and drug histories not on file.   No Known Allergies  History reviewed. No pertinent family history.  Prior to Admission medications   Medication Sig Start Date End Date Taking? Authorizing Provider  amiodarone (PACERONE) 200 MG tablet Take 200 mg by mouth every morning.     Yes Historical Provider, MD  atorvastatin (LIPITOR) 10 MG tablet Take 10 mg by mouth every evening.     Yes Historical Provider, MD  digoxin (LANOXIN) 0.125 MG tablet Take 125 mcg by mouth every morning.     Yes Historical Provider, MD  finasteride (PROSCAR) 5 MG tablet Take 5 mg by mouth every evening.     Yes Historical Provider, MD  fish oil-omega-3 fatty acids 1000 MG capsule Take 1 g by mouth every evening.     Yes Historical Provider, MD  levothyroxine (SYNTHROID, LEVOTHROID) 50 MCG tablet Take 50 mcg by mouth daily.   Yes Historical Provider, MD  metoprolol  (TOPROL-XL) 50 MG 24 hr tablet Take 50 mg by mouth daily.    Yes Historical Provider, MD  Multiple Vitamin (MULITIVITAMIN WITH MINERALS) TABS Take 1 tablet by mouth every evening.     Yes Historical Provider, MD  simvastatin (ZOCOR) 20 MG tablet Take 20 mg by mouth at bedtime.     Yes Historical Provider, MD  Tamsulosin HCl (FLOMAX) 0.4 MG CAPS Take 0.4 mg by mouth every evening.     Yes Historical Provider, MD  warfarin (COUMADIN) 5 MG tablet Take 2.5-5 mg by mouth See admin instructions. On Mon, Wed, Fri, Sat take 1/2 tab (2.5 mg) & on all other days take 1 tab (5mg )   Yes Historical Provider, MD   Physical Exam: Filed Vitals:   06/23/12 1547 06/23/12 1600 06/23/12 1952 06/23/12 2000  BP:  103/69 125/53 129/56  Pulse:  60 70 61  Temp:  98.2 F (36.8 C)    TempSrc:  Oral    Resp:  13 18 15   SpO2: 94% 98% 95% 99%    General:  NAD, resting comfortably in bed Eyes: PEERLA EOMI ENT: mucous membranes moist Neck: supple w/o JVD Cardiovascular: RRR w/o MRG Respiratory: CTA B Abdomen: soft, nt, nd, bs+ Skin: no rash nor lesion Musculoskeletal: MAE, ROM of RLE limited by pain. Psychiatric: normal tone and affect Neurologic: AAOx3, grossly non-focal  Labs on Admission:  Basic Metabolic Panel:  Lab 06/23/12 9562  NA 137  K 3.9  CL 103  CO2 25  GLUCOSE 123*  BUN 16  CREATININE 0.91  CALCIUM 8.7  MG --  PHOS --   Liver Function Tests: No results found for this basename: AST:5,ALT:5,ALKPHOS:5,BILITOT:5,PROT:5,ALBUMIN:5 in the last 168 hours No results found for this basename: LIPASE:5,AMYLASE:5 in the last 168 hours No results found for this basename: AMMONIA:5 in the last 168 hours CBC:  Lab 06/23/12 1558  WBC 11.1*  NEUTROABS 8.0*  HGB 13.5  HCT 41.1  MCV 94.9  PLT 171   Cardiac Enzymes: No results found for this basename: CKTOTAL:5,CKMB:5,CKMBINDEX:5,TROPONINI:5 in the last 168 hours  BNP (last 3 results) No results found for this basename: PROBNP:3 in the last  8760 hours CBG: No results found for this basename: GLUCAP:5 in the last 168 hours  Radiological Exams on Admission: Dg Chest 2 View  06/23/2012  *RADIOLOGY REPORT*  Clinical Data: Larey Seat and injured right hip approximately 2 hours ago.  Indwelling pacemaker.  CHEST - 2 VIEW  Comparison: Two-view chest x-ray 07/21/2011, 12/03/2010, 05/17/2007.  Findings: Cardiac silhouette mildly enlarged but stable.  Thoracic aorta atherosclerotic, unchanged.  Hilar and mediastinal contours otherwise unremarkable.  Emphysematous changes in the upper lobes. Chronic scarring in the lower lobes, left greater than right, unchanged.  No new pulmonary parenchymal abnormalities.  Right subclavian dual lead transvenous pacemaker unchanged appears intact. Degenerative changes and DISH involving the thoracic spine.  IMPRESSION: Stable cardiomegaly without pulmonary edema.  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  No acute cardiopulmonary disease.   Original Report Authenticated By: Hulan Saas, M.D.    Dg Hip Complete Right  06/23/2012  *RADIOLOGY REPORT*  Clinical Data: Larey Seat and injured right hip approximately 2 hours ago.  RIGHT HIP - COMPLETE 2+ VIEW  Comparison: None.  Findings: Nondisplaced fracture involving the right superior pubic ramus.  No other fractures about the right hip or elsewhere in the pelvis.  Moderate joint space narrowing in the right hip with mild protrusio deformity.  Mild joint space narrowing in the contralateral left hip. Sacroiliac joints and symphysis pubis intact with degenerative changes.  Degenerative changes involving the visualized lower lumbar spine.  IMPRESSION: Nondisplaced fracture involving the right superior pubic ramus.  No other fractures.   Original Report Authenticated By: Hulan Saas, M.D.    Ct Head Wo Contrast  06/23/2012  *RADIOLOGY REPORT*  Clinical Data: Fall.  CT HEAD WITHOUT CONTRAST  Technique:  Contiguous axial images were obtained from the base of the skull  through the vertex without contrast.  Comparison: Unenhanced cranial CT 11/18/2007, 04/29/2007.  Findings: Patient motion blurred images of the skull bases but these were repeated and a diagnostic study was obtained.  Ventricular system normal in size and appearance for age.  Severe cortical atrophy which has progressed since the prior examinations. Mild changes of small vessel disease of the white matter diffusely, also progressive.  Old lacunar stroke in the head of the left caudate nucleus and in the mid left basal ganglia, unchanged.  No mass lesion.  No midline shift.  No acute hemorrhage or hematoma. No extra-axial fluid collections.  No evidence of acute infarction. No significant interval change.  No skull fracture or other focal osseous abnormality involving the skull.  Visualized paranasal sinuses, bilateral mastoid air cells, and bilateral middle ear cavities well-aerated.  Bilateral carotid siphon atherosclerosis.  Bony nasal septal deviation to the left.  IMPRESSION:  1.  No acute intracranial abnormality. 2.  Severe cortical atrophy, progressive since 2009.  3.  Stable mild chronic microvascular ischemic changes of the white matter. 4.  Stable old lacunar strokes in the left basal ganglia.   Original Report Authenticated By: Hulan Saas, M.D.    Ct Hip Right Wo Contrast  06/23/2012  *RADIOLOGY REPORT*  Clinical Data: Fall.  Hip pain  CT OF THE RIGHT HIP WITHOUT CONTRAST  Technique:  Multidetector CT imaging was performed according to the standard protocol. Multiplanar CT image reconstructions were also generated.  Comparison: Pelvis 06/23/2012  Findings: Nondisplaced fracture of the right anterior sacrum.  SI joint is intact.  Fractures of the right superior and inferior pubic rami.  The superior pubic ramus fracture extends to the anterior acetabulum but does not appear to extend into the joint.  No fracture of the femur.  Mild cartilage loss and spurring involving the right hip joint.  No  fractures in the left pelvis are identified.  IMPRESSION: Fracture of the right superior pubic ramus extending into the anterior acetabulum.  Fractures are also present of the inferior pubic ramus on the right  and  the right anterior sacrum.   Original Report Authenticated By: Janeece Riggers, M.D.     EKG: Independently reviewed.  Assessment/Plan Principal Problem:  *Fall Active Problems:  Fracture of inferior pubic ramus  Fracture of superior pubic ramus  Sacral fracture   1. Fall - mechanical in nature 2. Fractures of R inferior and superior pubic rami as well as R sacrum - Spoke with Ortho on call regarding weight bearing status, will be toe touch on R for now with a pain tolerance as limitation.  Keeping him bed rest for tonight, probably should try to advance weight bearing and involve PT tomorrow, likely will end up needing rehab.   Informally spoke with Ortho on call to verify the above weight bearing status.  Code Status: Full Code (must indicate code status--if unknown or must be presumed, indicate so) Family Communication: Spoke with wife at bedside (indicate person spoken with, if applicable, with phone number if by telephone) Disposition Plan: Admit to obs (indicate anticipated LOS)  Time spent: 70 min  GARDNER, JARED M. [facility] Pager [phone]  If 7PM-7AM, please contact night-coverage [email or web address] Password [facility] 06/24/2012, 2:34 AM

## 2012-06-25 LAB — PROTIME-INR: INR: 2.04 — ABNORMAL HIGH (ref 0.00–1.49)

## 2012-06-25 NOTE — Progress Notes (Signed)
CARE MANAGEMENT NOTE 06/25/2012  Patient:  Bradley Nixon, Bradley Nixon   Account Number:  [id number]  Date Initiated:  06/25/2012  Documentation initiated by:  Vance Peper  Subjective/Objective Assessment:   78 yr old male admitted for pelvic fracture.     Action/Plan:   CM spoke with patient and wife concerning HH and DME needs. Choice offered. Patient has rolling walker.   Anticipated DC Date:  06/26/2012   Anticipated DC Plan:  HOME W HOME HEALTH SERVICES      DC Planning Services  CM consult      PAC Choice  DURABLE MEDICAL EQUIPMENT  HOME HEALTH   Choice offered to / List presented to:  C-3 Spouse   DME arranged  3-N-1      DME agency  [facility]     [facility] arranged  HH-1 RN  HH-2 PT  HH-4 NURSE'S AIDE  HH-6 SOCIAL WORKER      HH agency  [facility]   Status of service:  Completed, signed off Medicare Important Message given?   (If response is "NO", the following Medicare IM given date fields will be blank) Date Medicare IM given:   Date Additional Medicare IM given:    Discharge Disposition:  HOME W HOME HEALTH SERVICES  Per UR Regulation:    If discussed at Long Length of Stay Meetings, dates discussed:    Comments:

## 2012-06-25 NOTE — Progress Notes (Signed)
Late Entry for 07/17/2012.   G-codes based on chart review of PT evaluation by Aida Raider, PT.  07-17-2012 1244  PT G-Codes **NOT FOR INPATIENT CLASS**  Functional Assessment Tool Used (Clinical judgement based on review of PT evaluation )  Functional Limitation Mobility: Walking and moving around  Mobility: Walking and Moving Around Current Status (D6644) CL  Mobility: Walking and Moving Around Goal Status (I3474) CI

## 2012-06-25 NOTE — Progress Notes (Signed)
[facility]                                                                                Patient Demographics  Bradley Nixon, is a 78 y.o. male  QVZ:[id number]  PIR:[id number]  DOB - 01/14/1921  Admit date - 06/23/2012  Admitting Physician Hillary Bow, DO  Outpatient Primary MD for the patient is ROBERTS, Vernie Ammons, MD  LOS - 2   Chief Complaint  Patient presents with  . Fall  . Hip Pain        Assessment & Plan    Principal Problem:   1. Fall with R. Sided Fracture of inferior & superior pubic ramus - case was D/W by admitting MD with Ortho, on the Right leg toe toch only, PT to see, pain meds adjsuted. Discussed with patient and family, will arrange for home PT home RN and nursing aide likely to be discharged tomorrow.    2.H/O CAD,MI, Afib (Dr Herbie Baltimore)  and Pacemaker - asymptomatic, home meds continue.    3.Hypothyroidism - continue home dose synthroid.   Code Status: Full  Family Communication: with patient and family bedside.  Disposition Plan: TBD    Procedures None   Consults  Ortho over phone   Time Spent in minutes   35   Antibiotics     Anti-infectives    None      Scheduled Meds:    . amiodarone  200 mg Oral Daily  . atorvastatin  10 mg Oral QPM  . digoxin  125 mcg Oral BH-q7a  . docusate sodium  100 mg Oral BID  . finasteride  5 mg Oral QPM  . levothyroxine  50 mcg Oral QAC breakfast  . metoprolol succinate  50 mg Oral Daily  . multivitamin with minerals  1 tablet Oral QPM  . omega-3 acid ethyl esters  1 g Oral QAC supper  . polyethylene glycol  17 g Oral Daily  . sodium chloride  3 mL Intravenous Q12H  . sodium chloride  3 mL Intravenous Q12H  . Tamsulosin HCl  0.4 mg Oral QPM  . warfarin  5 mg Oral Custom   And  . warfarin  2.5 mg Oral Q M,W,F,Sa-1800  . Warfarin - Physician Dosing Inpatient   Does not apply q1800   Continuous Infusions:  PRN Meds:.sodium chloride, HYDROcodone-acetaminophen,  morphine injection, ondansetron (ZOFRAN) IV, sodium chloride   DVT Prophylaxis  Coumadin  Lab Results  Component Value Date   INR 2.04* 06/25/2012   INR 2.38* 06/23/2012   INR 2.64* 07/28/2011     Lab Results  Component Value Date   PLT 182 06/24/2012      Susa Raring K M.D on 06/25/2012 at 11:24 AM  Between 7am to 7pm - Pager - [phone]  After 7pm go to [email or web address] - password [email or web address]  And look for the night coverage person covering for me after hours  [facility] Office  [phone]    Subjective:   Bradley Nixon today has, No headache, No chest pain, No abdominal pain - No Nausea, No new weakness tingling or numbness, No Cough - SOB.+ve Rt sided pelvic pain.  Objective:   Filed Vitals:  06/24/12 0402 06/24/12 1256 06/24/12 2158 06/25/12 0517  BP:  115/50 102/51 103/60  Pulse:  60 61 64  Temp:  98.3 F (36.8 C) 99.3 F (37.4 C) 98.2 F (36.8 C)  TempSrc:      Resp:  16 16 16   Height: 5\' 8"  (1.727 m)     Weight: 77.792 kg (171 lb 8 oz)     SpO2:  96% 92% 96%    Wt Readings from Last 3 Encounters:  06/24/12 77.792 kg (171 lb 8 oz)  07/28/11 77.111 kg (170 lb)  07/28/11 77.111 kg (170 lb)     Intake/Output Summary (Last 24 hours) at 06/25/12 1124 Last data filed at 06/25/12 0700  Gross per 24 hour  Intake    240 ml  Output      0 ml  Net    240 ml    Exam Awake Alert, Oriented X 3, No new F.N deficits, Normal affect [place].AT,PERRAL Supple Neck,No JVD, No cervical lymphadenopathy appriciated.  Symmetrical Chest wall movement, Good air movement bilaterally, CTAB RRR,No Gallops,Rubs or new Murmurs, No Parasternal Heave +ve B.Sounds, Abd Soft, Non tender, No organomegaly appriciated, No rebound - guarding or rigidity. No Cyanosis, Clubbing or edema, No new Rash or bruise      Data Review   Micro Results No results found for this or any previous visit (from the past 240 hour(s)).  Radiology Reports Dg Chest 2 View  06/23/2012   *RADIOLOGY REPORT*  Clinical Data: Larey Seat and injured right hip approximately 2 hours ago.  Indwelling pacemaker.  CHEST - 2 VIEW  Comparison: Two-view chest x-ray 07/21/2011, 12/03/2010, 05/17/2007.  Findings: Cardiac silhouette mildly enlarged but stable.  Thoracic aorta atherosclerotic, unchanged.  Hilar and mediastinal contours otherwise unremarkable.  Emphysematous changes in the upper lobes. Chronic scarring in the lower lobes, left greater than right, unchanged.  No new pulmonary parenchymal abnormalities.  Right subclavian dual lead transvenous pacemaker unchanged appears intact. Degenerative changes and DISH involving the thoracic spine.  IMPRESSION: Stable cardiomegaly without pulmonary edema.  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  No acute cardiopulmonary disease.   Original Report Authenticated By: Hulan Saas, M.D.    Dg Hip Complete Right  06/23/2012  *RADIOLOGY REPORT*  Clinical Data: Larey Seat and injured right hip approximately 2 hours ago.  RIGHT HIP - COMPLETE 2+ VIEW  Comparison: None.  Findings: Nondisplaced fracture involving the right superior pubic ramus.  No other fractures about the right hip or elsewhere in the pelvis.  Moderate joint space narrowing in the right hip with mild protrusio deformity.  Mild joint space narrowing in the contralateral left hip. Sacroiliac joints and symphysis pubis intact with degenerative changes.  Degenerative changes involving the visualized lower lumbar spine.  IMPRESSION: Nondisplaced fracture involving the right superior pubic ramus.  No other fractures.   Original Report Authenticated By: Hulan Saas, M.D.    Ct Head Wo Contrast  06/23/2012  *RADIOLOGY REPORT*  Clinical Data: Fall.  CT HEAD WITHOUT CONTRAST  Technique:  Contiguous axial images were obtained from the base of the skull through the vertex without contrast.  Comparison: Unenhanced cranial CT 11/18/2007, 04/29/2007.  Findings: Patient motion blurred images of the skull  bases but these were repeated and a diagnostic study was obtained.  Ventricular system normal in size and appearance for age.  Severe cortical atrophy which has progressed since the prior examinations. Mild changes of small vessel disease of the white matter diffusely, also progressive.  Old lacunar stroke in the  head of the left caudate nucleus and in the mid left basal ganglia, unchanged.  No mass lesion.  No midline shift.  No acute hemorrhage or hematoma. No extra-axial fluid collections.  No evidence of acute infarction. No significant interval change.  No skull fracture or other focal osseous abnormality involving the skull.  Visualized paranasal sinuses, bilateral mastoid air cells, and bilateral middle ear cavities well-aerated.  Bilateral carotid siphon atherosclerosis.  Bony nasal septal deviation to the left.  IMPRESSION:  1.  No acute intracranial abnormality. 2.  Severe cortical atrophy, progressive since 2009. 3.  Stable mild chronic microvascular ischemic changes of the white matter. 4.  Stable old lacunar strokes in the left basal ganglia.   Original Report Authenticated By: Hulan Saas, M.D.    Ct Hip Right Wo Contrast  06/23/2012  *RADIOLOGY REPORT*  Clinical Data: Fall.  Hip pain  CT OF THE RIGHT HIP WITHOUT CONTRAST  Technique:  Multidetector CT imaging was performed according to the standard protocol. Multiplanar CT image reconstructions were also generated.  Comparison: Pelvis 06/23/2012  Findings: Nondisplaced fracture of the right anterior sacrum.  SI joint is intact.  Fractures of the right superior and inferior pubic rami.  The superior pubic ramus fracture extends to the anterior acetabulum but does not appear to extend into the joint.  No fracture of the femur.  Mild cartilage loss and spurring involving the right hip joint.  No fractures in the left pelvis are identified.  IMPRESSION: Fracture of the right superior pubic ramus extending into the anterior acetabulum.  Fractures  are also present of the inferior pubic ramus on the right  and  the right anterior sacrum.   Original Report Authenticated By: Janeece Riggers, M.D.     CBC  Lab 06/24/12 0500 06/23/12 1558  WBC 8.1 11.1*  HGB 13.4 13.5  HCT 40.8 41.1  PLT 182 171  MCV 96.0 94.9  MCH 31.5 31.2  MCHC 32.8 32.8  RDW 14.4 14.3  LYMPHSABS -- 2.0  MONOABS -- 1.0  EOSABS -- 0.1  BASOSABS -- 0.0  BANDABS -- --    Chemistries   Lab 06/24/12 0500 06/23/12 1558  NA 140 137  K 4.2 3.9  CL 105 103  CO2 26 25  GLUCOSE 109* 123*  BUN 14 16  CREATININE 0.80 0.91  CALCIUM 8.5 8.7  MG -- --  AST -- --  ALT -- --  ALKPHOS -- --  BILITOT -- --   ------------------------------------------------------------------------------------------------------------------ estimated creatinine clearance is 60.6 ml/min (by C-G formula based on Cr of 0.8). ------------------------------------------------------------------------------------------------------------------ No results found for this basename: HGBA1C:2 in the last 72 hours ------------------------------------------------------------------------------------------------------------------ No results found for this basename: CHOL:2,HDL:2,LDLCALC:2,TRIG:2,CHOLHDL:2,LDLDIRECT:2 in the last 72 hours ------------------------------------------------------------------------------------------------------------------ No results found for this basename: TSH,T4TOTAL,FREET3,T3FREE,THYROIDAB in the last 72 hours ------------------------------------------------------------------------------------------------------------------ No results found for this basename: VITAMINB12:2,FOLATE:2,FERRITIN:2,TIBC:2,IRON:2,RETICCTPCT:2 in the last 72 hours  Coagulation profile  Lab 06/25/12 0500 06/23/12 1558  INR 2.04* 2.38*  PROTIME -- --    No results found for this basename: DDIMER:2 in the last 72 hours  Cardiac Enzymes No results found for this basename:  CK:3,CKMB:3,TROPONINI:3,MYOGLOBIN:3 in the last 168 hours ------------------------------------------------------------------------------------------------------------------ No components found with this basename: POCBNP:3

## 2012-06-25 NOTE — Progress Notes (Signed)
Physical Therapy Treatment Patient Details Name: Bradley Nixon MRN: [id number] DOB: Aug 23, 1920 Today's Date: 06/25/2012 Time: 4010-2725 PT Time Calculation (min): 23 min  PT Assessment / Plan / Recommendation Comments on Treatment Session  Pt admitted s/p pelvis fracture and continues to progress with therapy. Pt able to tolearate ambulation this am with slightly increased distance. Significant assist continues to be required due to pain. Would benefit from continued therapy prior to d/c home in order to increase activity tolerate and independence while decreasing risk for falls.    Follow Up Recommendations  Home health PT;Supervision/Assistance - 24 hour (Spoke with MD, who reports pt does not qualify for SNF.)     Does the patient have the potential to tolerate intense rehabilitation     Barriers to Discharge        Equipment Recommendations  3 in 1 bedside comode    Recommendations for Other Services    Frequency Min 6X/week   Plan Discharge plan needs to be updated;Frequency remains appropriate    Precautions / Restrictions Precautions Precautions: Fall Restrictions Weight Bearing Restrictions: Yes RLE Weight Bearing: Touchdown weight bearing   Pertinent Vitals/Pain 7/10 in right hip with mobility. Pt repositioned.    Mobility  Bed Mobility Bed Mobility: Supine to Sit Supine to Sit: 3: Mod assist;HOB flat Details for Bed Mobility Assistance: Assist for right LE due to pain as well as trunk to translate anterior. Cues for sequence using left LE to scoot pelvis to EOB. Transfers Transfers: Sit to Stand;Stand to Sit Sit to Stand: 1: +2 Total assist;With upper extremity assist;From bed Sit to Stand: Patient Percentage: 60% Stand to Sit: 4: Min assist;With upper extremity assist;To chair/3-in-1 Details for Transfer Assistance: Assist to translate trunk anterior over BOS. Cues for safest hand placement. Limited by pain with stance. Ambulation/Gait Ambulation/Gait  Assistance: 3: Mod assist Ambulation Distance (Feet): 12 Feet Assistive device: Rolling walker Ambulation/Gait Assistance Details: Assist to off weight right side in order to maintain TTWBing right LE. Assist initially to also advance right LE with pt able to progress to advancing right LE without assist. Cues for tall posture and sequence with RW. Gait Pattern: Step-to pattern;Antalgic;Decreased stride length;Trunk flexed Gait velocity: decreased Stairs: No Wheelchair Mobility Wheelchair Mobility: No    Exercises General Exercises - Lower Extremity Ankle Circles/Pumps: AROM;Right;10 reps;Supine Quad Sets: AROM;Right;10 reps;Supine Heel Slides: AAROM;Right;10 reps;Supine   PT Diagnosis:    PT Problem List:   PT Treatment Interventions:     PT Goals Acute Rehab PT Goals PT Goal Formulation: With patient Time For Goal Achievement: 07/01/12 Potential to Achieve Goals: Good PT Goal: Supine/Side to Sit - Progress: Progressing toward goal PT Goal: Sit to Stand - Progress: Progressing toward goal PT Goal: Stand to Sit - Progress: Progressing toward goal PT Goal: Ambulate - Progress: Progressing toward goal  Visit Information  Last PT Received On: 06/25/12 Assistance Needed: +2    Subjective Data  Subjective: "I will give it a go." Patient Stated Goal: home with wife   Cognition  Overall Cognitive Status: Appears within functional limits for tasks assessed/performed Arousal/Alertness: Awake/alert Orientation Level: Appears intact for tasks assessed Behavior During Session: [facility] for tasks performed    Balance  Balance Balance Assessed: No  End of Session PT - End of Session Equipment Utilized During Treatment: Gait belt Activity Tolerance: Patient tolerated treatment well;Patient limited by pain Patient left: in chair;with call bell/phone within reach;with family/visitor present Nurse Communication: Mobility status   GP Functional Assessment Tool Used: Clinical  Judgement Functional Limitation: Mobility: Walking and moving around Mobility: Walking and Moving Around Current Status [phone]): At least 40 percent but less than 60 percent impaired, limited or restricted Mobility: Walking and Moving Around Goal Status [phone]): At least 1 percent but less than 20 percent impaired, limited or restricted   Cephus Shelling 06/25/2012, 11:30 AM  06/25/2012 Cephus Shelling, PT, DPT [phone]

## 2012-06-26 LAB — PROTIME-INR: Prothrombin Time: 20.2 seconds — ABNORMAL HIGH (ref 11.6–15.2)

## 2012-06-26 MED ORDER — WARFARIN SODIUM 5 MG PO TABS
5.0000 mg | ORAL_TABLET | Freq: Once | ORAL | Status: DC
  Filled 2012-06-26: qty 1

## 2012-06-26 MED ORDER — DSS 100 MG PO CAPS: 100.0000 mg | ORAL_CAPSULE | Freq: Two times a day (BID) | ORAL | Status: DC

## 2012-06-26 MED ORDER — HYDROCODONE-ACETAMINOPHEN 5-325 MG PO TABS: 1.0000 | ORAL_TABLET | ORAL | Status: DC | PRN

## 2012-06-26 MED ORDER — POLYETHYLENE GLYCOL 3350 17 G PO PACK: 17.0000 g | PACK | Freq: Every day | ORAL | Status: DC | PRN

## 2012-06-26 MED ORDER — WARFARIN - PHARMACIST DOSING INPATIENT: Freq: Every day | Status: DC

## 2012-06-26 NOTE — Progress Notes (Signed)
CSW set up ambulance transport home. Clinical Social Worker will sign off for now as social work intervention is no longer needed. Please consult [place] again if new need arises.   Sabino Niemann, MSW, [phone]

## 2012-06-26 NOTE — Progress Notes (Signed)
ANTICOAGULATION CONSULT NOTE - Initial Consult  Pharmacy Consult for Coumadin Indication: atrial fibrillation  No Known Allergies  Patient Measurements: Height: 5\' 8"  (172.7 cm) Weight: 171 lb 8 oz (77.792 kg) IBW/kg (Calculated) : 68.4   Vital Signs: Temp: 98.5 F (36.9 C) (12/03 0703) BP: 115/67 mmHg (12/03 0703) Pulse Rate: 72  (12/03 0703)  Labs:  Basename 06/26/12 0635 06/25/12 0500 06/24/12 0500 06/23/12 1558  HGB -- -- 13.4 13.5  HCT -- -- 40.8 41.1  PLT -- -- 182 171  APTT -- -- -- --  LABPROT 20.2* 22.2* -- 24.9*  INR 1.79* 2.04* -- 2.38*  HEPARINUNFRC -- -- -- --  CREATININE -- -- 0.80 0.91  CKTOTAL -- -- -- --  CKMB -- -- -- --  TROPONINI -- -- -- --    Estimated Creatinine Clearance: 60.6 ml/min (by C-G formula based on Cr of 0.8).   Medical History: Past Medical History  Diagnosis Date  . Coronary artery disease   . Myocardial infarction   . Anginal pain   . Hypothyroidism   . Shortness of breath   . Pacemaker   . Pneumonia   . Cancer   . Arthritis   . Complication of anesthesia hard to arouse after anesthesia    Medications:  Prescriptions prior to admission  Medication Sig Dispense Refill  . amiodarone (PACERONE) 200 MG tablet Take 200 mg by mouth every morning.        Marland Kitchen atorvastatin (LIPITOR) 10 MG tablet Take 10 mg by mouth every evening.        . digoxin (LANOXIN) 0.125 MG tablet Take 125 mcg by mouth every morning.        . finasteride (PROSCAR) 5 MG tablet Take 5 mg by mouth every evening.        . fish oil-omega-3 fatty acids 1000 MG capsule Take 1 g by mouth every evening.        Marland Kitchen levothyroxine (SYNTHROID, LEVOTHROID) 50 MCG tablet Take 50 mcg by mouth daily.      . metoprolol (TOPROL-XL) 50 MG 24 hr tablet Take 50 mg by mouth daily.       . Multiple Vitamin (MULITIVITAMIN WITH MINERALS) TABS Take 1 tablet by mouth every evening.        . simvastatin (ZOCOR) 20 MG tablet Take 20 mg by mouth at bedtime.        . Tamsulosin HCl  (FLOMAX) 0.4 MG CAPS Take 0.4 mg by mouth every evening.        . warfarin (COUMADIN) 5 MG tablet Take 2.5-5 mg by mouth See admin instructions. On Mon, Wed, Fri, Sat take 1/2 tab (2.5 mg) & on all other days take 1 tab (5mg )        Assessment: 78 y/o male patient admitted s/p fall, on chronic coumadin for h/o afib. INR subtherapeutic today and trend down after home regimen resumed. Pharmacy to adjust coumadin. No bleeding reported.  Goal of Therapy:  INR 2-3 Monitor platelets by anticoagulation protocol: Yes   Plan:  Coumadin 5mg  today and f/u daily protime.  Verlene Mayer, PharmD, BCPS Pager [phone] 06/26/2012,10:25 AM

## 2012-06-26 NOTE — Progress Notes (Signed)
Physical Therapy Treatment Patient Details Name: Bradley Nixon MRN: [id number] DOB: [birth year] Today's Date: 06/26/2012 Time: 4098-1191 PT Time Calculation (min): 46 min  PT Assessment / Plan / Recommendation Comments on Treatment Session  Pt admitted s/p pelvic fracture and remains motivated to progress. Pt able to tolerate ambulation this am increasing distance and independence. Wife present throughout in order to be educated on assistance needed for safe d/c home. Pt with ambulance transport arranged for home d/c, and not requiring stair training at this time. Requiring further PT follow-up to continue to increase safety and independence with HHPT arranged.    Follow Up Recommendations  Home health PT;Supervision/Assistance - 24 hour     Does the patient have the potential to tolerate intense rehabilitation     Barriers to Discharge        Equipment Recommendations  Rolling walker with 5" wheels    Recommendations for Other Services    Frequency Min 6X/week   Plan Discharge plan remains appropriate;Frequency remains appropriate    Precautions / Restrictions Precautions Precautions: Fall Restrictions Weight Bearing Restrictions: Yes RLE Weight Bearing: Touchdown weight bearing   Pertinent Vitals/Pain 10/10 in right hip with mobility. Pt repositioned.    Mobility  Bed Mobility Bed Mobility: Supine to Sit;Sit to Supine Supine to Sit: 4: Min assist;HOB flat Sit to Supine: 4: Min assist;HOB flat (2 trials.) Details for Bed Mobility Assistance: Wife able to assist pt with right LE due to pain while translating trunk anterior/posterior in and out of bed. Cues for safest sequence for pt and wife. Transfers Transfers: Sit to Stand;Stand to Sit (2 trials.) Sit to Stand: 4: Min guard;With upper extremity assist;From chair/3-in-1 Stand to Sit: 4: Min guard;With upper extremity assist;To chair/3-in-1;To bed Details for Transfer Assistance: Guarding for balance with cues for safest  hand/right LE placement due to pain and weight bearing status. Ambulation/Gait Ambulation/Gait Assistance: 4: Min guard Ambulation Distance (Feet): 25 Feet Assistive device: Rolling walker Ambulation/Gait Assistance Details: Guarding for balance with max cues for safest step-to sequence. Pt with tendency to get to close to front of RW. Cues for safety with RW and to maintain WBing status. Gait Pattern: Step-to pattern;Antalgic;Decreased stride length;Trunk flexed Gait velocity: decreased Stairs: No (Pt and wife report ambulance transport to home for d/c.) Wheelchair Mobility Wheelchair Mobility: No    Exercises     PT Diagnosis:    PT Problem List:   PT Treatment Interventions:     PT Goals Acute Rehab PT Goals PT Goal Formulation: With patient Time For Goal Achievement: 07/01/12 Potential to Achieve Goals: Good PT Goal: Supine/Side to Sit - Progress: Progressing toward goal PT Goal: Sit to Supine/Side - Progress: Progressing toward goal PT Goal: Sit to Stand - Progress: Progressing toward goal PT Goal: Stand to Sit - Progress: Progressing toward goal PT Goal: Ambulate - Progress: Progressing toward goal  Visit Information  Last PT Received On: 06/26/12 Assistance Needed: +1    Subjective Data  Subjective: "I just don't know if I am ready to go home today." Patient Stated Goal: home with wife   Cognition  Overall Cognitive Status: Appears within functional limits for tasks assessed/performed Arousal/Alertness: Awake/alert Orientation Level: Appears intact for tasks assessed Behavior During Session: [facility] for tasks performed    Balance  Balance Balance Assessed: No  End of Session PT - End of Session Equipment Utilized During Treatment: Gait belt Activity Tolerance: Patient tolerated treatment well;Patient limited by pain Patient left: in bed;with call bell/phone within reach;with family/visitor present  Nurse Communication: Mobility status   GP Functional Assessment  Tool Used: Clinical Judgement Functional Limitation: Mobility: Walking and moving around Mobility: Walking and Moving Around Current Status [phone]): At least 20 percent but less than 40 percent impaired, limited or restricted Mobility: Walking and Moving Around Goal Status [phone]): At least 1 percent but less than 20 percent impaired, limited or restricted   Cephus Shelling 06/26/2012, 2:04 PM  06/26/2012 Cephus Shelling, PT, DPT [phone]

## 2012-06-26 NOTE — Discharge Summary (Signed)
Bradley Nixon                                                                                   Bradley Nixon, is a 78 y.o. male  DOB 1920/08/10  MRN [id number].  Admission date:  06/23/2012  Discharge Date:  06/26/2012  Primary MD  Lorenda Peck, MD  Admitting Physician  Hillary Bow, DO  Admission Diagnosis  Pelvic fracture [808.8] Fall [E888.9] Sacral fracture [805.6] Fracture of inferior pubic ramus [808.2] Fracture of superior pubic ramus [808.2, E887] fall, pelvic fracture  Discharge Diagnosis     Principal Problem:  *Fall Active Problems:  Fracture of inferior pubic ramus  Fracture of superior pubic ramus  Sacral fracture  Hypothyroidism  Pacemaker  Myocardial infarction      Past Medical History  Diagnosis Date  . Coronary artery disease   . Myocardial infarction   . Anginal pain   . Hypothyroidism   . Shortness of breath   . Pacemaker   . Pneumonia   . Cancer   . Arthritis   . Complication of anesthesia hard to arouse after anesthesia    Past Surgical History  Procedure Date  . Cardioversion 07/28/2011    Procedure: CARDIOVERSION;  Surgeon: Marykay Lex;  Location: [facility] OR;  Service: Cardiovascular;  Laterality: N/A;  . Joint replacement   . Insert / replace / remove pacemaker   . Coronary angioplasty        Discharge Diagnoses:   Principal Problem:  *Fall Active Problems:  Fracture of inferior pubic ramus  Fracture of superior pubic ramus  Sacral fracture  Hypothyroidism  Pacemaker  Myocardial infarction    Discharge Condition: Stable   Diet recommendation: See Discharge Instructions below   Consults orthopedics over the phone   History of present illness and  Hospital Course:  See H&P, Labs, Consult and Test reports for all details in brief, patient was admitted for     1. Mechanical Fall with R. Sided Fracture of inferior & superior pubic ramus - case was D/W by admitting MD with Ortho, on the  Right leg toe toch only, was seen by PT, at this point will be discharged home with home PT, home RN and health aide, along with DME devices as requested by PT. Wife bedside agrees with the plan.      2.H/O CAD,MI, Afib (Dr Herbie Baltimore) and Pacemaker - asymptomatic, home meds continue. We'll follow with primary care physician and his primary cardiologist in the next week, instructed by me personally, will request primary care physician to recheck BMP CBC along with INR next visit.    3.Hypothyroidism - continue home dose synthroid.       Today   Subjective:   Itzel Lowrimore today has no headache,no chest abdominal pain,no new weakness tingling or numbness, feels much better wants to go home today. Mild left sided pelvic pain  Objective:   Blood pressure 115/67, pulse 72, temperature 98.5 F (36.9 C), temperature source Oral, resp. rate 18, height 5\' 8"  (1.727 m), weight 77.792 kg (171 lb 8 oz), SpO2 98.00%.   Intake/Output Summary (Last 24 hours) at 06/26/12 1058 Last data filed  at 06/25/12 1700  Gross per 24 hour  Intake    240 ml  Output      0 ml  Net    240 ml    Exam Awake Alert, Oriented *3, No new F.N deficits, Normal affect [place].AT,PERRAL Supple Neck,No JVD, No cervical lymphadenopathy appriciated.  Symmetrical Chest wall movement, Good air movement bilaterally, CTAB RRR,No Gallops,Rubs or new Murmurs, No Parasternal Heave +ve B.Sounds, Abd Soft, Non tender, No organomegaly appriciated, No rebound -guarding or rigidity. No Cyanosis, Clubbing or edema, No new Rash or bruise  Data Review   Major procedures and Radiology Reports - PLEASE review detailed and final reports for all details in brief -       Dg Chest 2 View  06/23/2012  *RADIOLOGY REPORT*  Clinical Data: Larey Seat and injured right hip approximately 2 hours ago.  Indwelling pacemaker.  CHEST - 2 VIEW  Comparison: Two-view chest x-ray 07/21/2011, 12/03/2010, 05/17/2007.  Findings: Cardiac silhouette mildly  enlarged but stable.  Thoracic aorta atherosclerotic, unchanged.  Hilar and mediastinal contours otherwise unremarkable.  Emphysematous changes in the upper lobes. Chronic scarring in the lower lobes, left greater than right, unchanged.  No new pulmonary parenchymal abnormalities.  Right subclavian dual lead transvenous pacemaker unchanged appears intact. Degenerative changes and DISH involving the thoracic spine.  IMPRESSION: Stable cardiomegaly without pulmonary edema.  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  No acute cardiopulmonary disease.   Original Report Authenticated By: Hulan Saas, M.D.    Dg Hip Complete Right  06/23/2012  *RADIOLOGY REPORT*  Clinical Data: Larey Seat and injured right hip approximately 2 hours ago.  RIGHT HIP - COMPLETE 2+ VIEW  Comparison: None.  Findings: Nondisplaced fracture involving the right superior pubic ramus.  No other fractures about the right hip or elsewhere in the pelvis.  Moderate joint space narrowing in the right hip with mild protrusio deformity.  Mild joint space narrowing in the contralateral left hip. Sacroiliac joints and symphysis pubis intact with degenerative changes.  Degenerative changes involving the visualized lower lumbar spine.  IMPRESSION: Nondisplaced fracture involving the right superior pubic ramus.  No other fractures.   Original Report Authenticated By: Hulan Saas, M.D.    Ct Head Wo Contrast  06/23/2012  *RADIOLOGY REPORT*  Clinical Data: Fall.  CT HEAD WITHOUT CONTRAST  Technique:  Contiguous axial images were obtained from the base of the skull through the vertex without contrast.  Comparison: Unenhanced cranial CT 11/18/2007, 04/29/2007.  Findings: Patient motion blurred images of the skull bases but these were repeated and a diagnostic study was obtained.  Ventricular system normal in size and appearance for age.  Severe cortical atrophy which has progressed since the prior examinations. Mild changes of small vessel disease  of the white matter diffusely, also progressive.  Old lacunar stroke in the head of the left caudate nucleus and in the mid left basal ganglia, unchanged.  No mass lesion.  No midline shift.  No acute hemorrhage or hematoma. No extra-axial fluid collections.  No evidence of acute infarction. No significant interval change.  No skull fracture or other focal osseous abnormality involving the skull.  Visualized paranasal sinuses, bilateral mastoid air cells, and bilateral middle ear cavities well-aerated.  Bilateral carotid siphon atherosclerosis.  Bony nasal septal deviation to the left.  IMPRESSION:  1.  No acute intracranial abnormality. 2.  Severe cortical atrophy, progressive since 2009. 3.  Stable mild chronic microvascular ischemic changes of the white matter. 4.  Stable old lacunar strokes in the  left basal ganglia.   Original Report Authenticated By: Hulan Saas, M.D.    Ct Hip Right Wo Contrast  06/23/2012  *RADIOLOGY REPORT*  Clinical Data: Fall.  Hip pain  CT OF THE RIGHT HIP WITHOUT CONTRAST  Technique:  Multidetector CT imaging was performed according to the standard protocol. Multiplanar CT image reconstructions were also generated.  Comparison: Pelvis 06/23/2012  Findings: Nondisplaced fracture of the right anterior sacrum.  SI joint is intact.  Fractures of the right superior and inferior pubic rami.  The superior pubic ramus fracture extends to the anterior acetabulum but does not appear to extend into the joint.  No fracture of the femur.  Mild cartilage loss and spurring involving the right hip joint.  No fractures in the left pelvis are identified.  IMPRESSION: Fracture of the right superior pubic ramus extending into the anterior acetabulum.  Fractures are also present of the inferior pubic ramus on the right  and  the right anterior sacrum.   Original Report Authenticated By: Janeece Riggers, M.D.     Micro Results     CBC w Diff: Lab Results  Component Value Date   WBC 8.1 06/24/2012    HGB 13.4 06/24/2012   HCT 40.8 06/24/2012   PLT 182 06/24/2012   LYMPHOPCT 18 06/23/2012   MONOPCT 9 06/23/2012   EOSPCT 1 06/23/2012   BASOPCT 0 06/23/2012    CMP: Lab Results  Component Value Date   NA 140 06/24/2012   K 4.2 06/24/2012   CL 105 06/24/2012   CO2 26 06/24/2012   BUN 14 06/24/2012   CREATININE 0.80 06/24/2012   PROT 6.3 11/18/2007   ALBUMIN 3.1* 11/18/2007   BILITOT 0.4 11/18/2007   ALKPHOS 47 11/18/2007   AST 21 11/18/2007   ALT 19 11/18/2007  .   Discharge Instructions     Home Health nursing, social worker, physical therapist and Aide to be provided by [facility] [phone].   Follow with Primary MD ROBERTS, Vernie Ammons, MD in 7 days   Get CBC, CMP, INR  checked 7 days by Primary MD and again as instructed by your Primary MD.   Get Medicines reviewed and adjusted.  Please request your Prim.MD to go over all Hospital Tests and Procedure/Radiological results at the follow up, please get all Hospital records sent to your Prim MD by signing hospital release before you go home.  Activity: Right leg toe-touch only, with Full fall precautions use walker/cane & assistance as needed   Diet:  Heart healthy, Aspiration precautions.  For Heart failure patients - Check your Weight same time everyday, if you gain over 2 pounds, or you develop in leg swelling, experience more shortness of breath or chest pain, call your Primary MD immediately. Follow Cardiac Low Salt Diet and 1.8 lit/day fluid restriction.  Disposition Home    If you experience worsening of your admission symptoms, develop shortness of breath, life threatening emergency, suicidal or homicidal thoughts you must seek medical attention immediately by calling 911 or calling your MD immediately  if symptoms less severe.  You Must read complete instructions/literature along with all the possible adverse reactions/side effects for all the Medicines you take and that have been prescribed to you. Take any  new Medicines after you have completely understood and accpet all the possible adverse reactions/side effects.   Do not drive and provide baby sitting services if your were admitted for syncope or siezures until you have seen by Primary MD or a Neurologist and advised  to do so again.  Do not drive when taking Pain medications.    Do not take more than prescribed Pain, Sleep and Anxiety Medications  Follow-up Information    Follow up with ROBERTS, Vernie Ammons, MD. Schedule an appointment as soon as possible for a visit in 1 week.   Contact information:   [address] [place] [address] [phone]       Follow up with Verlee Rossetti, MD. Schedule an appointment as soon as possible for a visit in 1 day.   Contact information:   [address], [address] [place] [address] [phone]            Discharge Medications     Medication List     As of 06/26/2012 10:58 AM    START taking these medications         DSS 100 MG Caps   Take 100 mg by mouth 2 (two) times daily.      HYDROcodone-acetaminophen 5-325 MG per tablet   Commonly known as: NORCO/VICODIN   Take 1 tablet by mouth every 4 (four) hours as needed.      polyethylene glycol packet   Commonly known as: MIRALAX / GLYCOLAX   Take 17 g by mouth daily as needed (constipation).      CONTINUE taking these medications         amiodarone 200 MG tablet   Commonly known as: PACERONE      atorvastatin 10 MG tablet   Commonly known as: LIPITOR      digoxin 0.125 MG tablet   Commonly known as: LANOXIN      finasteride 5 MG tablet   Commonly known as: PROSCAR      fish oil-omega-3 fatty acids 1000 MG capsule      levothyroxine 50 MCG tablet   Commonly known as: SYNTHROID, LEVOTHROID      metoprolol succinate 50 MG 24 hr tablet   Commonly known as: TOPROL-XL      multivitamin with minerals Tabs      simvastatin 20 MG tablet   Commonly known as: ZOCOR       Tamsulosin HCl 0.4 MG Caps   Commonly known as: FLOMAX      warfarin 5 MG tablet   Commonly known as: COUMADIN          Where to get your medications    These are the prescriptions that you need to pick up.   You may get these medications from any pharmacy.         DSS 100 MG Caps   HYDROcodone-acetaminophen 5-325 MG per tablet   polyethylene glycol packet               Total Time in preparing paper work, data evaluation and todays exam - 35 minutes  Leroy Sea M.D on 06/26/2012 at 10:58 AM  [facility]  [phone]

## 2012-10-10 ENCOUNTER — Ambulatory Visit: Payer: Self-pay | Admitting: Cardiovascular Disease

## 2012-11-12 ENCOUNTER — Other Ambulatory Visit: Payer: Self-pay | Admitting: Dermatology

## 2012-12-31 ENCOUNTER — Ambulatory Visit (INDEPENDENT_AMBULATORY_CARE_PROVIDER_SITE_OTHER): Payer: Medicare Other | Admitting: Pharmacist Clinician (PhC)/ Clinical Pharmacy Specialist

## 2012-12-31 VITALS — BP 130/80 | HR 72

## 2012-12-31 DIAGNOSIS — Z7901 Long term (current) use of anticoagulants: Secondary | ICD-10-CM

## 2012-12-31 DIAGNOSIS — I4891 Unspecified atrial fibrillation: Secondary | ICD-10-CM

## 2013-01-10 ENCOUNTER — Ambulatory Visit (INDEPENDENT_AMBULATORY_CARE_PROVIDER_SITE_OTHER): Payer: Medicare Other | Admitting: *Deleted

## 2013-01-10 DIAGNOSIS — I4891 Unspecified atrial fibrillation: Secondary | ICD-10-CM

## 2013-01-10 LAB — PACEMAKER DEVICE OBSERVATION

## 2013-01-10 NOTE — Progress Notes (Signed)
In office single chamber pacemaker interrogation. Normal device function. No changes made this session.

## 2013-01-10 NOTE — Patient Instructions (Addendum)
Follow up with Dr. Royann Shivers in September for your next pacemaker interrogation.

## 2013-01-11 ENCOUNTER — Encounter: Payer: Self-pay | Admitting: Cardiovascular Disease

## 2013-01-11 LAB — PACEMAKER DEVICE OBSERVATION
BATTERY VOLTAGE: 2.74 V
BMOD-0002RV: 8
BRDY-0004RV: 110 {beats}/min
DEVICE MODEL PM: 1984739
RV LEAD THRESHOLD: 2.25 V
VENTRICULAR PACING PM: 54

## 2013-01-30 ENCOUNTER — Ambulatory Visit: Payer: Medicare Other | Admitting: Pharmacist Clinician (PhC)/ Clinical Pharmacy Specialist

## 2013-01-30 ENCOUNTER — Ambulatory Visit (INDEPENDENT_AMBULATORY_CARE_PROVIDER_SITE_OTHER): Payer: Medicare Other | Admitting: Pharmacist Clinician (PhC)/ Clinical Pharmacy Specialist

## 2013-01-30 ENCOUNTER — Encounter: Payer: Medicare Other | Admitting: Pharmacist Clinician (PhC)/ Clinical Pharmacy Specialist

## 2013-01-30 VITALS — BP 92/56 | HR 72

## 2013-01-30 DIAGNOSIS — Z7901 Long term (current) use of anticoagulants: Secondary | ICD-10-CM

## 2013-01-30 DIAGNOSIS — I4891 Unspecified atrial fibrillation: Secondary | ICD-10-CM

## 2013-02-04 ENCOUNTER — Other Ambulatory Visit: Payer: Self-pay | Admitting: Pharmacist Clinician (PhC)/ Clinical Pharmacy Specialist

## 2013-02-04 MED ORDER — METOPROLOL SUCCINATE ER 50 MG PO TB24: 50.0000 mg | ORAL_TABLET | Freq: Every day | ORAL | Status: DC

## 2013-02-04 MED ORDER — WARFARIN SODIUM 5 MG PO TABS: ORAL_TABLET | ORAL | Status: DC

## 2013-02-04 MED ORDER — SIMVASTATIN 20 MG PO TABS: 20.0000 mg | ORAL_TABLET | Freq: Every day | ORAL | Status: DC

## 2013-02-12 ENCOUNTER — Ambulatory Visit: Payer: Medicare Other | Admitting: Pharmacist Clinician (PhC)/ Clinical Pharmacy Specialist

## 2013-02-13 ENCOUNTER — Ambulatory Visit (INDEPENDENT_AMBULATORY_CARE_PROVIDER_SITE_OTHER): Payer: Medicare Other | Admitting: Pharmacist Clinician (PhC)/ Clinical Pharmacy Specialist

## 2013-02-13 DIAGNOSIS — Z7901 Long term (current) use of anticoagulants: Secondary | ICD-10-CM

## 2013-02-13 DIAGNOSIS — I4891 Unspecified atrial fibrillation: Secondary | ICD-10-CM

## 2013-02-13 LAB — POCT INR: INR: 1.8

## 2013-02-28 ENCOUNTER — Ambulatory Visit (INDEPENDENT_AMBULATORY_CARE_PROVIDER_SITE_OTHER): Payer: Medicare Other | Admitting: Pharmacist Clinician (PhC)/ Clinical Pharmacy Specialist

## 2013-02-28 DIAGNOSIS — Z7901 Long term (current) use of anticoagulants: Secondary | ICD-10-CM

## 2013-02-28 DIAGNOSIS — I4891 Unspecified atrial fibrillation: Secondary | ICD-10-CM

## 2013-02-28 LAB — POCT INR: INR: 2.6

## 2013-03-14 ENCOUNTER — Ambulatory Visit: Payer: Medicare Other | Admitting: Pharmacist Clinician (PhC)/ Clinical Pharmacy Specialist

## 2013-03-18 ENCOUNTER — Telehealth: Payer: Self-pay | Admitting: Pharmacist Clinician (PhC)/ Clinical Pharmacy Specialist

## 2013-03-18 ENCOUNTER — Telehealth: Payer: Self-pay | Admitting: Cardiovascular Disease

## 2013-03-18 NOTE — Telephone Encounter (Signed)
Bradley Nixon would like to speak to about his medication .Marland Kitchen Please Call..   Thanks

## 2013-03-18 NOTE — Telephone Encounter (Signed)
Please call about his medications

## 2013-03-19 MED ORDER — ATORVASTATIN CALCIUM 10 MG PO TABS: 10.0000 mg | ORAL_TABLET | Freq: Every evening | ORAL | Status: DC

## 2013-03-22 ENCOUNTER — Other Ambulatory Visit: Payer: Self-pay | Admitting: Dermatology

## 2013-03-27 ENCOUNTER — Ambulatory Visit (INDEPENDENT_AMBULATORY_CARE_PROVIDER_SITE_OTHER): Payer: Medicare Other | Admitting: Pharmacist Clinician (PhC)/ Clinical Pharmacy Specialist

## 2013-03-27 VITALS — BP 120/68 | HR 72

## 2013-03-27 DIAGNOSIS — Z7901 Long term (current) use of anticoagulants: Secondary | ICD-10-CM

## 2013-03-27 DIAGNOSIS — I4891 Unspecified atrial fibrillation: Secondary | ICD-10-CM

## 2013-03-27 LAB — POCT INR: INR: 2.6

## 2013-04-07 ENCOUNTER — Encounter: Payer: Self-pay | Admitting: *Deleted

## 2013-04-10 ENCOUNTER — Encounter: Payer: Self-pay | Admitting: Cardiovascular Disease

## 2013-04-10 ENCOUNTER — Ambulatory Visit (INDEPENDENT_AMBULATORY_CARE_PROVIDER_SITE_OTHER): Payer: Medicare Other | Admitting: Cardiovascular Disease

## 2013-04-10 VITALS — BP 124/78 | HR 79 | Resp 16 | Ht 68.0 in | Wt 185.2 lb

## 2013-04-10 DIAGNOSIS — E785 Hyperlipidemia, unspecified: Secondary | ICD-10-CM

## 2013-04-10 DIAGNOSIS — I4891 Unspecified atrial fibrillation: Secondary | ICD-10-CM

## 2013-04-10 DIAGNOSIS — I219 Acute myocardial infarction, unspecified: Secondary | ICD-10-CM

## 2013-04-10 DIAGNOSIS — Z950 Presence of cardiac pacemaker: Secondary | ICD-10-CM

## 2013-04-10 LAB — PACEMAKER DEVICE OBSERVATION
BMOD-0002RV: 8
BRDY-0002RV: 60 {beats}/min
RV LEAD AMPLITUDE: 4.5 mv
RV LEAD IMPEDENCE PM: 521 Ohm
RV LEAD THRESHOLD: 3 V

## 2013-04-10 NOTE — Patient Instructions (Addendum)
Your physician recommends that you schedule a follow-up appointment in: 3 months for a device interrogation, and in 6 months for a routine follow up

## 2013-04-10 NOTE — Progress Notes (Signed)
In office pacemaker interrogation. Normal device function. Minimal changes made this session. 

## 2013-04-17 ENCOUNTER — Encounter: Payer: Self-pay | Admitting: Cardiovascular Disease

## 2013-04-17 DIAGNOSIS — E785 Hyperlipidemia, unspecified: Secondary | ICD-10-CM | POA: Insufficient documentation

## 2013-04-17 NOTE — Assessment & Plan Note (Signed)
Bradley Nixon pacemaker is still operating within normal parameters and has a roughly 1-1/2-2 and half years left before his battery longevity. He has a chronically elevated right ventricular pacing threshold that has not really changed much over the last several years. We have discussed this at previous visits. It is preferable to wait until he requires a battery change out and place a new right ventricular lead at that time to avoid the need for multiple procedures. Note amiodarone therapy further increased right ventricular pacing thresholds. The amio is not longer needed since he appears to have permanent arrhythmia. Continue checks and device clinic every 3 months.

## 2013-04-17 NOTE — Assessment & Plan Note (Signed)
Rate control is good. On warfarin anticoagulation.

## 2013-04-17 NOTE — Assessment & Plan Note (Signed)
We'll try to get his labs from PCP

## 2013-04-17 NOTE — Progress Notes (Signed)
Patient ID: Bradley Nixon, male   DOB: [birth year], 79 y.o.   MRN: [id number]     Reason for office visit Atrial fibrillation, pacemaker followup  Bradley Nixon has done well since his last appointment. He is much more active than the average 79 year old who looks considerably younger. He has not had any new falls. He has not had any serious injuries or bleeding problems. He remains on warfarin anticoagulation for permanent atrial fibrillation. He has a dual-chamber permanent pacemaker that was placed for sinus node dysfunction but is now programmed has a single-chamber ventricular device for permanent atrial fibrillation. He has not had a stroke or transient ischemic attack. He had normal coronary arteries by angiography in 2009.  His pacemaker has problems with chronically high pacing thresholds on the ventricular lead but we have decided to continue with her current system until it is time for generator change out anticipated to occur within the next couple of years.    Allergies  Allergen Reactions  . Zocor [Simvastatin]     Weakness     Current Outpatient Prescriptions  Medication Sig Dispense Refill  . atorvastatin (LIPITOR) 10 MG tablet Take 1 tablet (10 mg total) by mouth every evening.  90 tablet  2  . docusate sodium 100 MG CAPS Take 100 mg by mouth 2 (two) times daily.  20 capsule  0  . finasteride (PROSCAR) 5 MG tablet Take 5 mg by mouth every evening.        . fish oil-omega-3 fatty acids 1000 MG capsule Take 1 g by mouth every evening.        Marland Kitchen HYDROcodone-acetaminophen (NORCO/VICODIN) 5-325 MG per tablet Take 1 tablet by mouth every 4 (four) hours as needed.  30 tablet  0  . Lansoprazole (PREVACID PO) Take by mouth daily.      Marland Kitchen levothyroxine (SYNTHROID, LEVOTHROID) 50 MCG tablet Take 50 mcg by mouth daily.      . metoprolol succinate (TOPROL-XL) 50 MG 24 hr tablet Take 1 tablet (50 mg total) by mouth daily.  90 tablet  1  . Multiple Vitamin (MULITIVITAMIN WITH MINERALS) TABS Take 1  tablet by mouth every evening.        . polyethylene glycol (MIRALAX / GLYCOLAX) packet Take 17 g by mouth daily as needed (constipation).  14 each  0  . Tamsulosin HCl (FLOMAX) 0.4 MG CAPS Take 0.4 mg by mouth every evening.        . warfarin (COUMADIN) 5 MG tablet Take 1-1.5 tablets by mouth daily or as directed by coumadin clinic  135 tablet  1  . amiodarone (PACERONE) 200 MG tablet Take 200 mg by mouth every morning.        . digoxin (LANOXIN) 0.125 MG tablet Take 125 mcg by mouth every morning.         No current facility-administered medications for this visit.    Past Medical History  Diagnosis Date  . Coronary artery disease   . Myocardial infarction   . Anginal pain   . Hypothyroidism   . Shortness of breath   . Pacemaker 05/02/2007    [employer]  . Pneumonia   . Cancer   . Arthritis   . Complication of anesthesia hard to arouse after anesthesia  . Permanent atrial fibrillation     Past Surgical History  Procedure Laterality Date  . Cardioversion  07/28/2011    Procedure: CARDIOVERSION;  Surgeon: Marykay Lex;  Location: [facility] OR;  Service: Cardiovascular;  Laterality: N/A;  .  Joint replacement    . Permanent pacemaker insertion  05/02/2007    [employer]  . US echocardiography  09/17/2010    LA moderately dilated,mild TR, EF >55%  . Nm myocar perf wall motion  02/09/2010    no ischemia    Family History  Problem Relation Age of Onset  . Heart failure Mother   . Heart attack Father     History   Social History  . Marital Status: Married    Spouse Name: N/A    Number of Children: N/A  . Years of Education: N/A   Occupational History  . Not on file.   Social History Main Topics  . Smoking status: Never Smoker   . Smokeless tobacco: Not on file  . Alcohol Use: No  . Drug Use: No  . Sexual Activity: Not Currently   Other Topics Concern  . Not on file   Social History Narrative  . No narrative on file    Review of systems: The patient specifically denies  any chest pain at rest or with exertion, dyspnea at rest or with exertion, orthopnea, paroxysmal nocturnal dyspnea, syncope, palpitations, focal neurological deficits, intermittent claudication, lower extremity edema, unexplained weight gain, cough, hemoptysis or wheezing.  The patient also denies abdominal pain, nausea, vomiting, dysphagia, diarrhea, constipation, polyuria, polydipsia, dysuria, hematuria, frequency, urgency, abnormal bleeding or bruising, fever, chills, unexpected weight changes, mood swings, change in skin or hair texture, change in voice quality, auditory or visual problems, allergic reactions or rashes, new musculoskeletal complaints other than usual "aches and pains".   PHYSICAL EXAM BP 124/78  Pulse 79  Resp 16  Ht 5\' 8"  (1.727 m)  Wt 185 lb 3.2 oz (84.006 kg)  BMI 28.17 kg/m2  General: Alert, oriented x3, no distress Head: no evidence of trauma, PERRL, EOMI, no exophtalmos or lid lag, no myxedema, no xanthelasma; normal ears, nose and oropharynx Neck: normal jugular venous pulsations and no hepatojugular reflux; brisk carotid pulses without delay and no carotid bruits Chest: clear to auscultation, no signs of consolidation by percussion or palpation, normal fremitus, symmetrical and full respiratory excursions; right subclavian pacemaker site appears healthy Cardiovascular: normal position and quality of the apical impulse, irregular rhythm, normal first and widely split second heart sounds, no murmurs, rubs or gallops Abdomen: no tenderness or distention, no masses by palpation, no abnormal pulsatility or arterial bruits, normal bowel sounds, no hepatosplenomegaly Extremities: no clubbing, cyanosis or edema; 2+ radial, ulnar and brachial pulses bilaterally; 2+ right femoral, posterior tibial and dorsalis pedis pulses; 2+ left femoral, posterior tibial and dorsalis pedis pulses; no subclavian or femoral bruits Neurological: grossly nonfocal   EKG: Atrial fibrillation,  right bundle branch block, intermittent ventricular pacing; prominent T wave inversions, consider "memory" T wave  Lipid Panel     Component Value Date/Time   CHOL  Value: 135        ATP III CLASSIFICATION:  <200     mg/dL   Desirable  161-096  mg/dL   Borderline High  >=045    mg/dL   High 10/31/8117 1478   TRIG 102 11/18/2007 0930   HDL 38* 11/18/2007 0930   CHOLHDL 3.6 11/18/2007 0930   VLDL 20 11/18/2007 0930   LDLCALC  Value: 77        Total Cholesterol/HDL:CHD Risk Coronary Heart Disease Risk Table                     Men   Women  1/2 Average Risk  3.4   3.3 11/18/2007 0930    BMET    Component Value Date/Time   NA 140 06/24/2012 0500   K 4.2 06/24/2012 0500   CL 105 06/24/2012 0500   CO2 26 06/24/2012 0500   GLUCOSE 109* 06/24/2012 0500   BUN 14 06/24/2012 0500   CREATININE 0.80 06/24/2012 0500   CALCIUM 8.5 06/24/2012 0500   GFRNONAA 77* 06/24/2012 0500   GFRAA 89* 06/24/2012 0500     ASSESSMENT AND PLAN Pacemaker Mr. Heacock pacemaker is still operating within normal parameters and has a roughly 1-1/2-2 and half years left before his battery longevity. He has a chronically elevated right ventricular pacing threshold that has not really changed much over the last several years. We have discussed this at previous visits. It is preferable to wait until he requires a battery change out and place a new right ventricular lead at that time to avoid the need for multiple procedures. Note amiodarone therapy further increased right ventricular pacing thresholds. The amio is not longer needed since he appears to have permanent arrhythmia. Continue checks and device clinic every 3 months.  Atrial fibrillation Rate control is good. On warfarin anticoagulation.  Hyperlipidemia We'll try to get his labs from PCP  Orders Placed This Encounter  Procedures  . Pacemaker Device Observation  . EKG 12-Lead   No orders of the defined types were placed in this encounter.    Junious Silk, MD, [facility] [phone] office [phone] pager

## 2013-04-18 ENCOUNTER — Encounter: Payer: Self-pay | Admitting: Cardiovascular Disease

## 2013-04-24 ENCOUNTER — Ambulatory Visit: Payer: Medicare Other | Admitting: Pharmacist Clinician (PhC)/ Clinical Pharmacy Specialist

## 2013-05-01 ENCOUNTER — Ambulatory Visit (INDEPENDENT_AMBULATORY_CARE_PROVIDER_SITE_OTHER): Payer: Medicare Other | Admitting: Pharmacist Clinician (PhC)/ Clinical Pharmacy Specialist

## 2013-05-01 VITALS — BP 122/68 | HR 64

## 2013-05-01 DIAGNOSIS — Z7901 Long term (current) use of anticoagulants: Secondary | ICD-10-CM

## 2013-05-01 DIAGNOSIS — I4891 Unspecified atrial fibrillation: Secondary | ICD-10-CM

## 2013-05-01 LAB — POCT INR: INR: 1.7

## 2013-05-22 ENCOUNTER — Ambulatory Visit (INDEPENDENT_AMBULATORY_CARE_PROVIDER_SITE_OTHER): Payer: Medicare Other | Admitting: Pharmacist Clinician (PhC)/ Clinical Pharmacy Specialist

## 2013-05-22 VITALS — BP 124/64 | HR 72

## 2013-05-22 DIAGNOSIS — Z7901 Long term (current) use of anticoagulants: Secondary | ICD-10-CM

## 2013-05-22 DIAGNOSIS — I4891 Unspecified atrial fibrillation: Secondary | ICD-10-CM

## 2013-06-12 ENCOUNTER — Ambulatory Visit (INDEPENDENT_AMBULATORY_CARE_PROVIDER_SITE_OTHER): Payer: Medicare Other | Admitting: Pharmacist Clinician (PhC)/ Clinical Pharmacy Specialist

## 2013-06-12 VITALS — BP 108/66 | HR 72

## 2013-06-12 DIAGNOSIS — I4891 Unspecified atrial fibrillation: Secondary | ICD-10-CM

## 2013-06-12 DIAGNOSIS — Z7901 Long term (current) use of anticoagulants: Secondary | ICD-10-CM

## 2013-06-12 LAB — POCT INR: INR: 2.5

## 2013-07-10 ENCOUNTER — Ambulatory Visit (INDEPENDENT_AMBULATORY_CARE_PROVIDER_SITE_OTHER): Payer: Medicare Other | Admitting: Cardiovascular Disease

## 2013-07-10 ENCOUNTER — Encounter: Payer: Self-pay | Admitting: Cardiovascular Disease

## 2013-07-10 ENCOUNTER — Ambulatory Visit (INDEPENDENT_AMBULATORY_CARE_PROVIDER_SITE_OTHER): Payer: Medicare Other | Admitting: Pharmacist Clinician (PhC)/ Clinical Pharmacy Specialist

## 2013-07-10 VITALS — BP 122/90 | HR 74 | Resp 16 | Ht 67.5 in | Wt 180.4 lb

## 2013-07-10 DIAGNOSIS — Z950 Presence of cardiac pacemaker: Secondary | ICD-10-CM

## 2013-07-10 DIAGNOSIS — I4891 Unspecified atrial fibrillation: Secondary | ICD-10-CM

## 2013-07-10 DIAGNOSIS — Z7901 Long term (current) use of anticoagulants: Secondary | ICD-10-CM

## 2013-07-10 DIAGNOSIS — M2550 Pain in unspecified joint: Secondary | ICD-10-CM

## 2013-07-10 LAB — PACEMAKER DEVICE OBSERVATION

## 2013-07-10 LAB — POCT INR: INR: 2.4

## 2013-07-10 MED ORDER — METOPROLOL SUCCINATE ER 25 MG PO TB24: 25.0000 mg | ORAL_TABLET | Freq: Every day | ORAL | Status: DC

## 2013-07-10 NOTE — Patient Instructions (Addendum)
Your physician has recommended you make the following change in your medication: REDUCE YOUR METOPROLOL TO HALF A TABLET (25 MG) ONCE DAILY with your current prescription.  The new prescription is for the correct dose of 25mg  daily.   Your physician recommends that you schedule a follow-up appointment in: 3 months + pacemaker check    You will need lab work done today [employer]

## 2013-07-11 ENCOUNTER — Encounter: Payer: Self-pay | Admitting: Cardiovascular Disease

## 2013-07-11 ENCOUNTER — Ambulatory Visit: Payer: Medicare Other | Admitting: Pharmacist Clinician (PhC)/ Clinical Pharmacy Specialist

## 2013-07-11 LAB — MDC_IDC_ENUM_SESS_TYPE_INCLINIC
Brady Statistic RV Percent Paced: 51 %
Implantable Pulse Generator Serial Number: 1984739
Lead Channel Impedance Value: 421 Ohm
Lead Channel Sensing Intrinsic Amplitude: 7 mV
Lead Channel Setting Sensing Sensitivity: 2 mV

## 2013-07-11 LAB — RHEUMATOID FACTOR: Rhuematoid fact SerPl-aCnc: <10 IU/mL (ref <=14)

## 2013-07-11 NOTE — Assessment & Plan Note (Signed)
He has a dual-chamber pacemaker that is programmed as a ventricular device. He has chronically elevated right ventricular pacing thresholds (thresholds are better with unipolar pacing). He is not pacemaker dependent. He will probably require generator change out in the next couple of years and should receive a new ventricular lead at the time. Early lead revision does not appear to be currently warranted.

## 2013-07-11 NOTE — Assessment & Plan Note (Signed)
He has atrial fibrillation that is frequently with slow ventricular response and paces roughly 50-60% of the time. He is not receiving any rate control medications. I am concerned about interrupting his anticoagulant, since he has multiple risk factors for cardioembolic stroke including advanced age and a history of previous stroke. We discussed the fact that for him to receive any type of spinal intervention he will probably have to stop his warfarin for a protracted period of time. I don't think his pain is currently incapacitated and I would avoid invasive procedures if possible.

## 2013-07-11 NOTE — Progress Notes (Signed)
Patient ID: Bradley Nixon, male   DOB: 02-06-1921, 79 y.o.   MRN: [id number]      Reason for office visit Permanent atrial fibrillation, CAD, Pacemaker  Bradley Nixon has done well since his last appointment. He is also much less worried, now that his wife Bradley Nixon is also doing better. He continues to be much more active than the average 79 year old and looks considerably younger. He has not had any new falls. He has not had any serious injuries or bleeding problems.   He remains on warfarin anticoagulation for permanent atrial fibrillation. He has a dual-chamber permanent pacemaker that was placed for sinus node dysfunction but is now programmed has a single-chamber ventricular device for permanent atrial fibrillation. About a year ago we stopped the amiodarone therapy since he had very high frequency ventricular pacing. By now this medication should be gone, he continues to pace roughly 50-60% of the time. Only one paced beat is seen on today's electrocardiogram  I have always thought that he had never had a stroke/TIA or other neurological or embolic events. However he tells me about an episode that occurred roughly 15 years ago when he clearly had complete expressive aphasia and right-sided hemiparesis for about 12 hours. His neurological symptoms resolve spontaneously.  He had normal coronary arteries by angiography in 2009.   His pacemaker has problems with chronically high pacing thresholds on the ventricular lead but we have decided to continue with her current system until it is time for generator change out anticipated to occur within the next couple of years.  What bothers him the most is bilateral pain that sounds neuropathic. It is most prominent in his ankles knees and hips he also feels it in his back and sometimes even in his shoulders. He was recently evaluated by a spine specialist who suggested that he needs "a needle in his spine". Not sure if he was recommended a CT myelogram or an  analgetic/steroid injection.    Allergies  Allergen Reactions  . Zocor [Simvastatin]     Weakness     Current Outpatient Prescriptions  Medication Sig Dispense Refill  . aspirin EC 81 MG tablet Take 81 mg by mouth daily.      Marland Kitchen atorvastatin (LIPITOR) 10 MG tablet Take 1 tablet (10 mg total) by mouth every evening.  90 tablet  2  . docusate sodium 100 MG CAPS Take 100 mg by mouth 2 (two) times daily.  20 capsule  0  . finasteride (PROSCAR) 5 MG tablet Take 5 mg by mouth every evening.        . fish oil-omega-3 fatty acids 1000 MG capsule Take 1 g by mouth every evening.        . Lansoprazole (PREVACID PO) Take by mouth daily.      Marland Kitchen levothyroxine (SYNTHROID, LEVOTHROID) 50 MCG tablet Take 50 mcg by mouth daily.      . Multiple Vitamin (MULITIVITAMIN WITH MINERALS) TABS Take 1 tablet by mouth every evening.        . Tamsulosin HCl (FLOMAX) 0.4 MG CAPS Take 0.4 mg by mouth every evening.        . traMADol (ULTRAM) 50 MG tablet Take 50 mg by mouth every 8 (eight) hours as needed.      . warfarin (COUMADIN) 5 MG tablet Take 1-1.5 tablets by mouth daily or as directed by coumadin clinic  135 tablet  1  . metoprolol succinate (TOPROL XL) 25 MG 24 hr tablet Take 1 tablet (25 mg total)  by mouth daily.  90 tablet  3   No current facility-administered medications for this visit.    Past Medical History  Diagnosis Date  . Coronary artery disease   . Myocardial infarction   . Anginal pain   . Hypothyroidism   . Shortness of breath   . Pacemaker 05/02/2007    [employer]  . Pneumonia   . Cancer   . Arthritis   . Complication of anesthesia hard to arouse after anesthesia  . Permanent atrial fibrillation     Past Surgical History  Procedure Laterality Date  . Cardioversion  07/28/2011    Procedure: CARDIOVERSION;  Surgeon: Marykay Lex;  Location: [facility] OR;  Service: Cardiovascular;  Laterality: N/A;  . Joint replacement    . Permanent pacemaker insertion  05/02/2007    [employer]  . US  echocardiography  09/17/2010    LA moderately dilated,mild TR, EF >55%  . Nm myocar perf wall motion  02/09/2010    no ischemia    Family History  Problem Relation Age of Onset  . Heart failure Mother   . Heart attack Father     History   Social History  . Marital Status: Married    Spouse Name: N/A    Number of Children: N/A  . Years of Education: N/A   Occupational History  . Not on file.   Social History Main Topics  . Smoking status: Never Smoker   . Smokeless tobacco: Not on file  . Alcohol Use: No  . Drug Use: No  . Sexual Activity: Not Currently   Other Topics Concern  . Not on file   Social History Narrative  . No narrative on file    Review of systems: The patient specifically denies any chest pain at rest or with exertion, dyspnea at rest or with exertion, orthopnea, paroxysmal nocturnal dyspnea, syncope, palpitations, focal neurological deficits, intermittent claudication, lower extremity edema, unexplained weight gain, cough, hemoptysis or wheezing.  The patient also denies abdominal pain, nausea, vomiting, dysphagia, diarrhea, constipation, polyuria, polydipsia, dysuria, hematuria, frequency, urgency, abnormal bleeding or bruising, fever, chills, unexpected weight changes, mood swings, change in skin or hair texture, change in voice quality, auditory or visual problems, allergic reactions or rashes. Has widespread bilateral pain in multiple joints but apparently is attributable to a spinal problem.   PHYSICAL EXAM BP 122/90  Pulse 74  Resp 16  Ht 5' 7.5" (1.715 m)  Wt 180 lb 6.4 oz (81.829 kg)  BMI 27.82 kg/m2 General: Alert, oriented x3, no distress  Head: no evidence of trauma, PERRL, EOMI, no exophtalmos or lid lag, no myxedema, no xanthelasma; normal ears, nose and oropharynx  Neck: normal jugular venous pulsations and no hepatojugular reflux; brisk carotid pulses without delay and no carotid bruits  Chest: clear to auscultation, no signs of  consolidation by percussion or palpation, normal fremitus, symmetrical and full respiratory excursions; right subclavian pacemaker site appears healthy  Cardiovascular: normal position and quality of the apical impulse, irregular rhythm, normal first and widely split second heart sounds, no murmurs, rubs or gallops  Abdomen: no tenderness or distention, no masses by palpation, no abnormal pulsatility or arterial bruits, normal bowel sounds, no hepatosplenomegaly  Extremities: no clubbing, cyanosis or edema; 2+ radial, ulnar and brachial pulses bilaterally; 2+ right femoral, posterior tibial and dorsalis pedis pulses; 2+ left femoral, posterior tibial and dorsalis pedis pulses; no subclavian or femoral bruits  Neurological: grossly nonfocal   EKG: Atrial fibrillation, right bundle branch block  Lipid  Panel     Component Value Date/Time   CHOL  Value: 135        ATP III CLASSIFICATION:  <200     mg/dL   Desirable  161-096  mg/dL   Borderline High  >=045    mg/dL   High 10/31/8117 1478   TRIG 102 11/18/2007 0930   HDL 38* 11/18/2007 0930   CHOLHDL 3.6 11/18/2007 0930   VLDL 20 11/18/2007 0930   LDLCALC  Value: 77        Total Cholesterol/HDL:CHD Risk Coronary Heart Disease Risk Table                     Men   Women  1/2 Average Risk   3.4   3.3 11/18/2007 0930    BMET    Component Value Date/Time   NA 140 06/24/2012 0500   K 4.2 06/24/2012 0500   CL 105 06/24/2012 0500   CO2 26 06/24/2012 0500   GLUCOSE 109* 06/24/2012 0500   BUN 14 06/24/2012 0500   CREATININE 0.80 06/24/2012 0500   CALCIUM 8.5 06/24/2012 0500   GFRNONAA 77* 06/24/2012 0500   GFRAA 89* 06/24/2012 0500     ASSESSMENT AND PLAN Atrial fibrillation He has atrial fibrillation that is frequently with slow ventricular response and paces roughly 50-60% of the time. He is not receiving any rate control medications. I am concerned about interrupting his anticoagulant, since he has multiple risk factors for cardioembolic stroke including  advanced age and a history of previous stroke. We discussed the fact that for him to receive any type of spinal intervention he will probably have to stop his warfarin for a protracted period of time. I don't think his pain is currently incapacitated and I would avoid invasive procedures if possible.  Pacemaker He has a dual-chamber pacemaker that is programmed as a ventricular device. He has chronically elevated right ventricular pacing thresholds (thresholds are better with unipolar pacing). He is not pacemaker dependent. He will probably require generator change out in the next couple of years and should receive a new ventricular lead at the time. Early lead revision does not appear to be currently warranted.  He has widespread and prominent arthralgias and stiffness I ordered a few labs to screen for rheumatoid arthritis or polymyalgia rheumatica. By physical exam, it is more likely that he has plain degenerative osteoarthritis. Orders Placed This Encounter  Procedures  . Sed Rate (ESR)  . C-reactive protein  . Rheumatoid Factor  . EKG 12-Lead   Meds ordered this encounter  Medications  . aspirin EC 81 MG tablet    Sig: Take 81 mg by mouth daily.  . traMADol (ULTRAM) 50 MG tablet    Sig: Take 50 mg by mouth every 8 (eight) hours as needed.  . metoprolol succinate (TOPROL XL) 25 MG 24 hr tablet    Sig: Take 1 tablet (25 mg total) by mouth daily.    Dispense:  90 tablet    Refill:  3    Stepan Verrette  Thurmon Fair, MD, [facility] [phone] office [phone] pager

## 2013-07-12 ENCOUNTER — Encounter: Payer: Self-pay | Admitting: Cardiovascular Disease

## 2013-08-15 ENCOUNTER — Ambulatory Visit (INDEPENDENT_AMBULATORY_CARE_PROVIDER_SITE_OTHER): Payer: Medicare Other | Admitting: Pharmacist Clinician (PhC)/ Clinical Pharmacy Specialist

## 2013-08-15 VITALS — BP 124/76 | HR 80

## 2013-08-15 DIAGNOSIS — Z7901 Long term (current) use of anticoagulants: Secondary | ICD-10-CM

## 2013-08-15 DIAGNOSIS — I4891 Unspecified atrial fibrillation: Secondary | ICD-10-CM

## 2013-08-15 LAB — POCT INR: INR: 2.6

## 2013-08-15 MED ORDER — LEVOTHYROXINE SODIUM 50 MCG PO TABS: 50.0000 ug | ORAL_TABLET | Freq: Every day | ORAL | Status: DC

## 2013-09-12 ENCOUNTER — Ambulatory Visit: Payer: Medicare Other | Admitting: Pharmacist Clinician (PhC)/ Clinical Pharmacy Specialist

## 2013-09-17 ENCOUNTER — Ambulatory Visit: Payer: Medicare Other | Admitting: Pharmacist Clinician (PhC)/ Clinical Pharmacy Specialist

## 2013-09-23 ENCOUNTER — Ambulatory Visit: Payer: Medicare Other | Admitting: Pharmacist Clinician (PhC)/ Clinical Pharmacy Specialist

## 2013-09-27 ENCOUNTER — Ambulatory Visit (INDEPENDENT_AMBULATORY_CARE_PROVIDER_SITE_OTHER): Payer: Medicare Other | Admitting: Pharmacist Clinician (PhC)/ Clinical Pharmacy Specialist

## 2013-09-27 VITALS — BP 126/68 | HR 80

## 2013-09-27 DIAGNOSIS — I4891 Unspecified atrial fibrillation: Secondary | ICD-10-CM

## 2013-09-27 DIAGNOSIS — Z7901 Long term (current) use of anticoagulants: Secondary | ICD-10-CM

## 2013-09-27 LAB — POCT INR: INR: 2.3

## 2013-10-10 ENCOUNTER — Encounter: Payer: Self-pay | Admitting: Cardiovascular Disease

## 2013-10-10 ENCOUNTER — Ambulatory Visit (INDEPENDENT_AMBULATORY_CARE_PROVIDER_SITE_OTHER): Payer: Medicare Other | Admitting: Cardiovascular Disease

## 2013-10-10 VITALS — BP 112/62 | HR 84 | Resp 16 | Ht 68.0 in | Wt 174.0 lb

## 2013-10-10 DIAGNOSIS — R0602 Shortness of breath: Secondary | ICD-10-CM

## 2013-10-10 DIAGNOSIS — R0989 Other specified symptoms and signs involving the circulatory and respiratory systems: Secondary | ICD-10-CM

## 2013-10-10 DIAGNOSIS — I4891 Unspecified atrial fibrillation: Secondary | ICD-10-CM

## 2013-10-10 DIAGNOSIS — Z950 Presence of cardiac pacemaker: Secondary | ICD-10-CM

## 2013-10-10 DIAGNOSIS — R0600 Dyspnea, unspecified: Secondary | ICD-10-CM

## 2013-10-10 DIAGNOSIS — R0609 Other forms of dyspnea: Secondary | ICD-10-CM

## 2013-10-10 LAB — MDC_IDC_ENUM_SESS_TYPE_INCLINIC
Brady Statistic RV Percent Paced: 28 %
Lead Channel Sensing Intrinsic Amplitude: 4.2 mV
Lead Channel Setting Pacing Amplitude: 5 V
Lead Channel Setting Pacing Pulse Width: 1 ms
Lead Channel Setting Sensing Sensitivity: 2 mV
MDC IDC MSMT BATTERY IMPEDANCE: 2800 Ohm
MDC IDC MSMT BATTERY VOLTAGE: 2.75 V
MDC IDC MSMT LEADCHNL RV IMPEDANCE VALUE: 439 Ohm
MDC IDC MSMT LEADCHNL RV PACING THRESHOLD AMPLITUDE: 3.5 V
MDC IDC MSMT LEADCHNL RV PACING THRESHOLD PULSEWIDTH: 1 ms
MDC IDC PG SERIAL: 1984739
MDC IDC SESS DTM: 20150319143111

## 2013-10-10 LAB — PACEMAKER DEVICE OBSERVATION

## 2013-10-10 NOTE — Patient Instructions (Addendum)
Your physician has requested that you have an echocardiogram. Echocardiography is a painless test that uses sound waves to create images of your heart. It provides your doctor with information about the size and shape of your heart and how well your heart's chambers and valves are working. This procedure takes approximately one hour. There are no restrictions for this procedure.  Your physician recommends that you schedule a follow-up appointment in: 3 months with Dr.Croitoru + pacemaker check

## 2013-10-18 ENCOUNTER — Encounter: Payer: Self-pay | Admitting: Cardiovascular Disease

## 2013-10-18 DIAGNOSIS — R0600 Dyspnea, unspecified: Secondary | ICD-10-CM | POA: Insufficient documentation

## 2013-10-18 NOTE — Progress Notes (Signed)
Patient ID: Bradley Nixon, male   DOB: 03-23-1921, 80 y.o.   MRN: [id number]      Reason for office visit Atrial fibrillation with slow ventricular response, pacemaker followup  Bradley Nixon has noticed more fatigue and shortness of breath with walking for last one month.  Device interrogation shows no change in the chronic problem of elevated ventricular pacing threshold. Note is made that his average heart rate has increased to the 90s. No episodes of high ventricular rates were recorded.  He remains on warfarin anticoagulation for permanent atrial fibrillation. He has a dual-chamber permanent pacemaker that was placed for sinus node dysfunction but is now programmed has a single-chamber ventricular device for permanent atrial fibrillation. About a year ago we stopped the amiodarone therapy since he had very high frequency ventricular pacing. After the amiodarone fully wore off, ventricular pacing frequency is much lower now at only 20%..  I have always thought that he had never had a stroke/TIA or other neurological or embolic events. However he tells me about an episode that occurred roughly 15 years ago when he clearly had complete expressive aphasia and right-sided hemiparesis for about 12 hours. His neurological symptoms resolve spontaneously.  He had normal coronary arteries by angiography in 2009.  His pacemaker has problems with chronically high pacing thresholds on the ventricular lead but we have decided to continue with her current system until it is time for generator change out anticipated to occur within the next couple of years.  Allergies  Allergen Reactions  . Zocor [Simvastatin]     Weakness     Current Outpatient Prescriptions  Medication Sig Dispense Refill  . aspirin EC 81 MG tablet Take 81 mg by mouth daily.      Marland Kitchen atorvastatin (LIPITOR) 10 MG tablet Take 1 tablet (10 mg total) by mouth every evening.  90 tablet  2  . DimenhyDRINATE (DRAMAMINE PO) Take 1 tablet by mouth as  needed.      . docusate sodium 100 MG CAPS Take 100 mg by mouth 2 (two) times daily.  20 capsule  0  . finasteride (PROSCAR) 5 MG tablet Take 5 mg by mouth every evening.        . fish oil-omega-3 fatty acids 1000 MG capsule Take 1 g by mouth every evening.        . Lansoprazole (PREVACID PO) Take by mouth daily.      Marland Kitchen levothyroxine (SYNTHROID, LEVOTHROID) 50 MCG tablet Take 1 tablet (50 mcg total) by mouth daily.  90 tablet  3  . metoprolol succinate (TOPROL XL) 25 MG 24 hr tablet Take 1 tablet (25 mg total) by mouth daily.  90 tablet  3  . Multiple Vitamin (MULITIVITAMIN WITH MINERALS) TABS Take 1 tablet by mouth every evening.        . Tamsulosin HCl (FLOMAX) 0.4 MG CAPS Take 0.4 mg by mouth every evening.        . warfarin (COUMADIN) 5 MG tablet Take 1-1.5 tablets by mouth daily or as directed by coumadin clinic  135 tablet  1  . traMADol (ULTRAM) 50 MG tablet Take 50 mg by mouth every 8 (eight) hours as needed.       No current facility-administered medications for this visit.    Past Medical History  Diagnosis Date  . Coronary artery disease   . Myocardial infarction   . Anginal pain   . Hypothyroidism   . Shortness of breath   . Pacemaker 05/02/2007    [employer]  .  Pneumonia   . Cancer   . Arthritis   . Complication of anesthesia hard to arouse after anesthesia  . Permanent atrial fibrillation     Past Surgical History  Procedure Laterality Date  . Cardioversion  07/28/2011    Procedure: CARDIOVERSION;  Surgeon: Leonie Man;  Location: [facility] OR;  Service: Cardiovascular;  Laterality: N/A;  . Joint replacement    . Permanent pacemaker insertion  05/02/2007    [employer]  . US echocardiography  09/17/2010    LA moderately dilated,mild TR, EF >55%  . Nm myocar perf wall motion  02/09/2010    no ischemia    Family History  Problem Relation Age of Onset  . Heart failure Mother   . Heart attack Father     History   Social History  . Marital Status: Married    Spouse  Name: N/A    Number of Children: N/A  . Years of Education: N/A   Occupational History  . Not on file.   Social History Main Topics  . Smoking status: Never Smoker   . Smokeless tobacco: Not on file  . Alcohol Use: No  . Drug Use: No  . Sexual Activity: Not Currently   Other Topics Concern  . Not on file   Social History Narrative  . No narrative on file    Review of systems: The patient specifically denies any chest pain at rest or with exertion, dyspnea at rest or with exertion, orthopnea, paroxysmal nocturnal dyspnea, syncope, palpitations, focal neurological deficits, intermittent claudication, lower extremity edema, unexplained weight gain, cough, hemoptysis or wheezing.  The patient also denies abdominal pain, nausea, vomiting, dysphagia, diarrhea, constipation, polyuria, polydipsia, dysuria, hematuria, frequency, urgency, abnormal bleeding or bruising, fever, chills, unexpected weight changes, mood swings, change in skin or hair texture, change in voice quality, auditory or visual problems, allergic reactions or rashes.  Has widespread bilateral pain in multiple joints but apparently is attributable to a spinal problem.   PHYSICAL EXAM BP 112/62  Pulse 84  Resp 16  Ht 5\' 8"  (1.727 m)  Wt 78.926 kg (174 lb)  BMI 26.46 kg/m2 General: Alert, oriented x3, no distress  Head: no evidence of trauma, PERRL, EOMI, no exophtalmos or lid lag, no myxedema, no xanthelasma; normal ears, nose and oropharynx  Neck: normal jugular venous pulsations and no hepatojugular reflux; brisk carotid pulses without delay and no carotid bruits  Chest: clear to auscultation, no signs of consolidation by percussion or palpation, normal fremitus, symmetrical and full respiratory excursions; right subclavian pacemaker site appears healthy  Cardiovascular: normal position and quality of the apical impulse, irregular rhythm, normal first and widely split second heart sounds, no murmurs, rubs or gallops    Abdomen: no tenderness or distention, no masses by palpation, no abnormal pulsatility or arterial bruits, normal bowel sounds, no hepatosplenomegaly  Extremities: no clubbing, cyanosis or edema; 2+ radial, ulnar and brachial pulses bilaterally; 2+ right femoral, posterior tibial and dorsalis pedis pulses; 2+ left femoral, posterior tibial and dorsalis pedis pulses; no subclavian or femoral bruits  Neurological: grossly nonfocal  EKG: Atrial fibrillation, right bundle branch block   Lipid Panel     Component Value Date/Time   CHOL  Value: 135        ATP III CLASSIFICATION:  <200     mg/dL   Desirable  200-239  mg/dL   Borderline High  >=240    mg/dL   High 11/18/2007 0930   TRIG 102 11/18/2007 0930   HDL  38* 11/18/2007 0930   CHOLHDL 3.6 11/18/2007 0930   VLDL 20 11/18/2007 0930   LDLCALC  Value: 77        Total Cholesterol/HDL:CHD Risk Coronary Heart Disease Risk Table                     Men   Women  1/2 Average Risk   3.4   3.3 11/18/2007 0930    BMET    Component Value Date/Time   NA 140 06/24/2012 0500   K 4.2 06/24/2012 0500   CL 105 06/24/2012 0500   CO2 26 06/24/2012 0500   GLUCOSE 109* 06/24/2012 0500   BUN 14 06/24/2012 0500   CREATININE 0.80 06/24/2012 0500   CALCIUM 8.5 06/24/2012 0500   GFRNONAA 77* 06/24/2012 0500   GFRAA 89* 06/24/2012 0500     ASSESSMENT AND PLAN Dyspnea There does appear to be some reduction in his functional capacity, although he is still much more active than the average 80 year old man. His average ventricular rate is faster which may be explained by left ventricular dysfunction or the interruption of his amiodarone. Have recommended that he have an echocardiogram. In 2012 his left ventricular ejection fraction was greater than 55% and had mild left ventricular hypertrophy, otherwise unremarkable.  Atrial fibrillation Adequate rate control. Appropriate anticoagulation. Note an episode of prolonged transient ischemic attack with unilateral hemiparesis and  aphasia that occurred 15 years ago.  Pacemaker Dual-chamber [employer] device programmed VVIR for permanent atrial fibrillation with roughly 1-2 years left on the current generator. When it is time for generator change out it will probably be wise to replace his ventricular lead. He is not pacemaker dependent. No permanent reprogramming changes performed today.   Orders Placed This Encounter  Procedures  . Implantable device check  . 2D Echocardiogram without contrast   Meds ordered this encounter  Medications  . DimenhyDRINATE (DRAMAMINE PO)    Sig: Take 1 tablet by mouth as needed.    Holli Humbles, MD, [place] [phone] office [phone] pager

## 2013-10-18 NOTE — Assessment & Plan Note (Signed)
Dual-chamber [employer] device programmed VVIR for permanent atrial fibrillation with roughly 1-2 years left on the current generator. When it is time for generator change out it will probably be wise to replace his ventricular lead. He is not pacemaker dependent. No permanent reprogramming changes performed today.

## 2013-10-18 NOTE — Assessment & Plan Note (Signed)
There does appear to be some reduction in his functional capacity, although he is still much more active than the average 80 year old man. His average ventricular rate is faster which may be explained by left ventricular dysfunction or the interruption of his amiodarone. Have recommended that he have an echocardiogram. In 2012 his left ventricular ejection fraction was greater than 55% and had mild left ventricular hypertrophy, otherwise unremarkable.

## 2013-10-18 NOTE — Assessment & Plan Note (Signed)
Adequate rate control. Appropriate anticoagulation. Note an episode of prolonged transient ischemic attack with unilateral hemiparesis and aphasia that occurred 15 years ago.

## 2013-10-28 ENCOUNTER — Ambulatory Visit (INDEPENDENT_AMBULATORY_CARE_PROVIDER_SITE_OTHER): Payer: Medicare Other | Admitting: Pharmacist Clinician (PhC)/ Clinical Pharmacy Specialist

## 2013-10-28 VITALS — BP 124/68 | HR 72

## 2013-10-28 DIAGNOSIS — Z7901 Long term (current) use of anticoagulants: Secondary | ICD-10-CM

## 2013-10-28 DIAGNOSIS — I4891 Unspecified atrial fibrillation: Secondary | ICD-10-CM

## 2013-10-28 LAB — POCT INR: INR: 3.3

## 2013-11-05 ENCOUNTER — Other Ambulatory Visit: Payer: Self-pay | Admitting: Dermatology

## 2013-11-25 ENCOUNTER — Ambulatory Visit (INDEPENDENT_AMBULATORY_CARE_PROVIDER_SITE_OTHER): Payer: Medicare Other | Admitting: Pharmacist Clinician (PhC)/ Clinical Pharmacy Specialist

## 2013-11-25 VITALS — BP 150/80 | HR 84

## 2013-11-25 DIAGNOSIS — I4891 Unspecified atrial fibrillation: Secondary | ICD-10-CM

## 2013-11-25 DIAGNOSIS — Z7901 Long term (current) use of anticoagulants: Secondary | ICD-10-CM

## 2013-11-25 LAB — POCT INR: INR: 2.2

## 2013-12-23 ENCOUNTER — Other Ambulatory Visit: Payer: Self-pay | Admitting: Pharmacist Clinician (PhC)/ Clinical Pharmacy Specialist

## 2013-12-23 ENCOUNTER — Ambulatory Visit: Payer: Medicare Other | Admitting: Pharmacist Clinician (PhC)/ Clinical Pharmacy Specialist

## 2013-12-23 NOTE — Telephone Encounter (Signed)
Rx was sent to pharmacy electronically. 

## 2013-12-30 ENCOUNTER — Ambulatory Visit (INDEPENDENT_AMBULATORY_CARE_PROVIDER_SITE_OTHER): Payer: Medicare Other | Admitting: Pharmacist Clinician (PhC)/ Clinical Pharmacy Specialist

## 2013-12-30 DIAGNOSIS — Z7901 Long term (current) use of anticoagulants: Secondary | ICD-10-CM

## 2013-12-30 DIAGNOSIS — I4891 Unspecified atrial fibrillation: Secondary | ICD-10-CM

## 2013-12-30 LAB — POCT INR: INR: 2.4

## 2014-01-13 ENCOUNTER — Ambulatory Visit (HOSPITAL_COMMUNITY)
Admission: RE | Admit: 2014-01-13 | Discharge: 2014-01-13 | Disposition: A | Discharge status: Home / Self Care | Payer: Medicare Other | Source: Ambulatory Visit | Attending: Internal Medicine | Admitting: Internal Medicine

## 2014-01-13 DIAGNOSIS — R0989 Other specified symptoms and signs involving the circulatory and respiratory systems: Principal | ICD-10-CM | POA: Insufficient documentation

## 2014-01-13 DIAGNOSIS — R0609 Other forms of dyspnea: Secondary | ICD-10-CM | POA: Insufficient documentation

## 2014-01-13 DIAGNOSIS — R0602 Shortness of breath: Secondary | ICD-10-CM

## 2014-01-13 DIAGNOSIS — I359 Nonrheumatic aortic valve disorder, unspecified: Secondary | ICD-10-CM

## 2014-01-13 NOTE — Progress Notes (Signed)
2D Echo Performed 01/13/2014    Marygrace Drought, RCS

## 2014-01-30 ENCOUNTER — Telehealth: Payer: Self-pay | Admitting: *Deleted

## 2014-01-30 NOTE — Telephone Encounter (Signed)
Message copied by Tressa Busman on Thu Jan 30, 2014 12:31 PM ------      Message from: Sanda Klein      Created: Wed Jan 29, 2014  2:42 PM       Good results on echo/ Normal EF, mild valvular leaks are not meaningful ------

## 2014-01-30 NOTE — Telephone Encounter (Signed)
Echo results called to patient. Voiced understanding and will keep appt next week for device check.  C/O vertigo which he is seeing his PCP for.

## 2014-02-11 ENCOUNTER — Ambulatory Visit (INDEPENDENT_AMBULATORY_CARE_PROVIDER_SITE_OTHER): Payer: Medicare Other | Admitting: Cardiovascular Disease

## 2014-02-11 ENCOUNTER — Encounter: Payer: Self-pay | Admitting: Cardiovascular Disease

## 2014-02-11 ENCOUNTER — Ambulatory Visit (INDEPENDENT_AMBULATORY_CARE_PROVIDER_SITE_OTHER): Payer: Medicare Other | Admitting: Pharmacist Clinician (PhC)/ Clinical Pharmacy Specialist

## 2014-02-11 VITALS — BP 122/66 | HR 82 | Resp 20 | Ht 68.0 in | Wt 169.3 lb

## 2014-02-11 DIAGNOSIS — Z950 Presence of cardiac pacemaker: Secondary | ICD-10-CM

## 2014-02-11 DIAGNOSIS — Z7901 Long term (current) use of anticoagulants: Secondary | ICD-10-CM

## 2014-02-11 DIAGNOSIS — I4891 Unspecified atrial fibrillation: Secondary | ICD-10-CM

## 2014-02-11 LAB — POCT INR: INR: 2

## 2014-02-11 MED ORDER — METOPROLOL SUCCINATE ER 25 MG PO TB24: 12.5000 mg | ORAL_TABLET | Freq: Every day | ORAL | Status: DC

## 2014-02-11 NOTE — Patient Instructions (Addendum)
Decrease Metoprolol to 1/2 tablet a day.  Dr. Sallyanne Kuster recommends that you schedule a follow-up appointment in: 3 months with [employer]  pacemaker check.

## 2014-02-12 ENCOUNTER — Encounter: Payer: Self-pay | Admitting: Cardiovascular Disease

## 2014-02-12 LAB — MDC_IDC_ENUM_SESS_TYPE_INCLINIC
Battery Impedance: 2900 Ohm
Battery Voltage: 2.71 V
Implantable Pulse Generator Model: 5826
Lead Channel Impedance Value: 496 Ohm
Lead Channel Pacing Threshold Amplitude: 2.75 V
Lead Channel Pacing Threshold Pulse Width: 1 ms
Lead Channel Sensing Intrinsic Amplitude: 4.6 mV
Lead Channel Setting Pacing Amplitude: 5 V
Lead Channel Setting Pacing Pulse Width: 1 ms
Lead Channel Setting Sensing Sensitivity: 2 mV
MDC IDC PG SERIAL: 1984739
MDC IDC SESS DTM: 20150721142127

## 2014-02-12 NOTE — Progress Notes (Signed)
Patient ID: Bradley Nixon, male   DOB: Dec 29, 1920, 80 y.o.   MRN: [id number]     Reason for office visit Pacemaker, atrial fibrillation, history of CVA  Bradley Nixon has no complaints. He had what sounds like benign positional vertigo 2 weeks ago. His rhythm remains permanent atrial fibrillation, with good ventricular rate control and only 23% ventricular pacing. He has not experienced any falls.  Pacemaker check continues to show high, but steady V pacing threshold (2.75V@1 .37ms)and mediocre sensing (4-5 mV). With current settings, generator longevity is estimated at 1 year.  He takes on warfarin anticoagulation for permanent atrial fibrillation. He has a dual-chamber permanent pacemaker that was placed for sinus node dysfunction but is now programmed has a single-chamber ventricular device for permanent atrial fibrillation. About a year ago we stopped the amiodarone therapy since he had very high frequency ventricular pacing. After the amiodarone fully wore off, ventricular pacing frequency is much lower now at only 20%. His pacemaker has problems with chronically high pacing thresholds on the ventricular lead but we have decided to continue with her current system until it is time for generator change out anticipated to occur within the next couple of years. Roughly 15 years ago when he had complete expressive aphasia and right-sided hemiparesis for about 12 hours. His neurological symptoms resolved spontaneously.  He had normal coronary arteries by angiography in 2009.  Echo in June 2015 showed EF 55-60%, dilated LA.   Allergies  Allergen Reactions  . Zocor [Simvastatin]     Weakness     Current Outpatient Prescriptions  Medication Sig Dispense Refill  . aspirin EC 81 MG tablet Take 81 mg by mouth daily.      Marland Kitchen atorvastatin (LIPITOR) 10 MG tablet TAKE 1 TABLET EVERY EVENING  90 tablet  2  . DimenhyDRINATE (DRAMAMINE PO) Take 1 tablet by mouth as needed.      . docusate sodium 100 MG CAPS Take  100 mg by mouth 2 (two) times daily.  20 capsule  0  . finasteride (PROSCAR) 5 MG tablet Take 5 mg by mouth every evening.        . fish oil-omega-3 fatty acids 1000 MG capsule Take 1 g by mouth every evening.        . Lansoprazole (PREVACID PO) Take by mouth daily.      Marland Kitchen levothyroxine (SYNTHROID, LEVOTHROID) 50 MCG tablet Take 1 tablet (50 mcg total) by mouth daily.  90 tablet  3  . metoprolol succinate (TOPROL XL) 25 MG 24 hr tablet Take 0.5 tablets (12.5 mg total) by mouth daily.  90 tablet  3  . Multiple Vitamin (MULITIVITAMIN WITH MINERALS) TABS Take 1 tablet by mouth every evening.        . Tamsulosin HCl (FLOMAX) 0.4 MG CAPS Take 0.4 mg by mouth every evening.        . traMADol (ULTRAM) 50 MG tablet Take 50 mg by mouth every 8 (eight) hours as needed.      . warfarin (COUMADIN) 5 MG tablet Take 1-1.5 tablets by mouth daily or as directed by coumadin clinic  135 tablet  1   No current facility-administered medications for this visit.    Past Medical History  Diagnosis Date  . Coronary artery disease   . Myocardial infarction   . Anginal pain   . Hypothyroidism   . Shortness of breath   . Pacemaker 05/02/2007    [employer].Jude  . Pneumonia   . Cancer   . Arthritis   .  Complication of anesthesia hard to arouse after anesthesia  . Permanent atrial fibrillation     Past Surgical History  Procedure Laterality Date  . Cardioversion  07/28/2011    Procedure: CARDIOVERSION;  Surgeon: Leonie Man;  Location: [facility] OR;  Service: Cardiovascular;  Laterality: N/A;  . Joint replacement    . Permanent pacemaker insertion  05/02/2007    St.Jude  . US echocardiography  09/17/2010    LA moderately dilated,mild TR, EF >55%  . Nm myocar perf wall motion  02/09/2010    no ischemia    Family History  Problem Relation Age of Onset  . Heart failure Mother   . Heart attack Father     History   Social History  . Marital Status: Married    Spouse Name: N/A    Number of Children: N/A  . Years  of Education: N/A   Occupational History  . Not on file.   Social History Main Topics  . Smoking status: Never Smoker   . Smokeless tobacco: Not on file  . Alcohol Use: No  . Drug Use: No  . Sexual Activity: Not Currently   Other Topics Concern  . Not on file   Social History Narrative  . No narrative on file    Review of systems: The patient specifically denies any chest pain at rest or with exertion, dyspnea at rest or with exertion, orthopnea, paroxysmal nocturnal dyspnea, syncope, palpitations, focal neurological deficits, intermittent claudication, lower extremity edema, unexplained weight gain, cough, hemoptysis or wheezing.  The patient also denies abdominal pain, nausea, vomiting, dysphagia, diarrhea, constipation, polyuria, polydipsia, dysuria, hematuria, frequency, urgency, abnormal bleeding or bruising, fever, chills, unexpected weight changes, mood swings, change in skin or hair texture, change in voice quality, auditory or visual problems, allergic reactions or rashes.  Has widespread bilateral pain in multiple joints but apparently is attributable to a spinal problem.  PHYSICAL EXAM BP 122/66  Pulse 82  Resp 20  Ht 5\' 8"  (1.727 m)  Wt 169 lb 4.8 oz (76.794 kg)  BMI 25.75 kg/m2 General: Alert, oriented x3, no distress  Head: no evidence of trauma, PERRL, EOMI, no exophtalmos or lid lag, no myxedema, no xanthelasma; normal ears, nose and oropharynx  Neck: normal jugular venous pulsations and no hepatojugular reflux; brisk carotid pulses without delay and no carotid bruits  Chest: clear to auscultation, no signs of consolidation by percussion or palpation, normal fremitus, symmetrical and full respiratory excursions; right subclavian pacemaker site appears healthy  Cardiovascular: normal position and quality of the apical impulse, irregular rhythm, normal first and widely split second heart sounds, no murmurs, rubs or gallops  Abdomen: no tenderness or distention, no  masses by palpation, no abnormal pulsatility or arterial bruits, normal bowel sounds, no hepatosplenomegaly  Extremities: no clubbing, cyanosis or edema; 2+ radial, ulnar and brachial pulses bilaterally; 2+ right femoral, posterior tibial and dorsalis pedis pulses; 2+ left femoral, posterior tibial and dorsalis pedis pulses; no subclavian or femoral bruits  Neurological: grossly nonfocal   EKG: AF, RBBB, indeterminate axis  Lipid Panel     Component Value Date/Time   CHOL  Value: 135        ATP III CLASSIFICATION:  <200     mg/dL   Desirable  200-239  mg/dL   Borderline High  >=240    mg/dL   High 11/18/2007 0930   TRIG 102 11/18/2007 0930   HDL 38* 11/18/2007 0930   CHOLHDL 3.6 11/18/2007 0930   VLDL 20 11/18/2007  0930   LDLCALC  Value: 77        Total Cholesterol/HDL:CHD Risk Coronary Heart Disease Risk Table                     Men   Women  1/2 Average Risk   3.4   3.3 11/18/2007 0930    BMET    Component Value Date/Time   NA 140 06/24/2012 0500   K 4.2 06/24/2012 0500   CL 105 06/24/2012 0500   CO2 26 06/24/2012 0500   GLUCOSE 109* 06/24/2012 0500   BUN 14 06/24/2012 0500   CREATININE 0.80 06/24/2012 0500   CALCIUM 8.5 06/24/2012 0500   GFRNONAA 77* 06/24/2012 0500   GFRAA 89* 06/24/2012 0500     ASSESSMENT AND PLAN  Atrial fibrillation  Adequate rate control. Appropriate anticoagulation. Note an episode of prolonged transient ischemic attack with unilateral hemiparesis and aphasia that occurred 15 years ago.  Pacemaker  Dual-chamber [employer] Jude device programmed VVIR for permanent atrial fibrillation with roughly 1 year left on the current generator. If he needs generator change out it will be necessary to replace his ventricular lead. He is not pacemaker dependent. I wonder whether he really needs the pacemaker anymore, since his atrial fibrillation is now permanent. The following reprogramming changes performed today: reduced base rate to 60/nighttime 50,. Also reduced metoprolol dose in  1/2. If his ventricular rate control remains adequate, may stop metoprolol completely - he may have further reduction in pacing need.   Patient Instructions  Decrease Metoprolol to 1/2 tablet a day.  Dr. Sallyanne Kuster recommends that you schedule a follow-up appointment in: 3 months with [employer] Jude  pacemaker check.       Orders Placed This Encounter  Procedures  . Implantable device check  . EKG 12-Lead   Meds ordered this encounter  Medications  . metoprolol succinate (TOPROL XL) 25 MG 24 hr tablet    Sig: Take 0.5 tablets (12.5 mg total) by mouth daily.    Dispense:  90 tablet    Refill:  Leota Terrance Usery, MD, [facility] [phone] office [phone] pager

## 2014-02-18 ENCOUNTER — Encounter: Payer: Self-pay | Admitting: Cardiovascular Disease

## 2014-03-10 ENCOUNTER — Other Ambulatory Visit: Payer: Self-pay | Admitting: Cardiovascular Disease

## 2014-03-14 ENCOUNTER — Ambulatory Visit (INDEPENDENT_AMBULATORY_CARE_PROVIDER_SITE_OTHER): Payer: Medicare Other | Admitting: Pharmacist Clinician (PhC)/ Clinical Pharmacy Specialist

## 2014-03-14 DIAGNOSIS — Z7901 Long term (current) use of anticoagulants: Secondary | ICD-10-CM

## 2014-03-14 DIAGNOSIS — I4891 Unspecified atrial fibrillation: Secondary | ICD-10-CM

## 2014-03-14 LAB — POCT INR: INR: 1.5

## 2014-04-04 ENCOUNTER — Ambulatory Visit (INDEPENDENT_AMBULATORY_CARE_PROVIDER_SITE_OTHER): Payer: Medicare Other | Admitting: Pharmacist Clinician (PhC)/ Clinical Pharmacy Specialist

## 2014-04-04 DIAGNOSIS — Z7901 Long term (current) use of anticoagulants: Secondary | ICD-10-CM

## 2014-04-04 DIAGNOSIS — I4891 Unspecified atrial fibrillation: Secondary | ICD-10-CM

## 2014-04-04 LAB — POCT INR: INR: 1.7

## 2014-04-25 ENCOUNTER — Ambulatory Visit: Payer: Medicare Other | Admitting: Pharmacist Clinician (PhC)/ Clinical Pharmacy Specialist

## 2014-04-30 ENCOUNTER — Ambulatory Visit (INDEPENDENT_AMBULATORY_CARE_PROVIDER_SITE_OTHER): Payer: Medicare Other | Admitting: Pharmacist Clinician (PhC)/ Clinical Pharmacy Specialist

## 2014-04-30 DIAGNOSIS — Z7901 Long term (current) use of anticoagulants: Secondary | ICD-10-CM

## 2014-04-30 DIAGNOSIS — I4891 Unspecified atrial fibrillation: Secondary | ICD-10-CM

## 2014-04-30 LAB — POCT INR: INR: 2.6

## 2014-05-22 ENCOUNTER — Telehealth: Payer: Self-pay | Admitting: Pharmacist Clinician (PhC)/ Clinical Pharmacy Specialist

## 2014-05-23 NOTE — Telephone Encounter (Signed)
Closed encounter °

## 2014-05-28 ENCOUNTER — Ambulatory Visit (INDEPENDENT_AMBULATORY_CARE_PROVIDER_SITE_OTHER): Payer: Medicare Other | Admitting: Pharmacist Clinician (PhC)/ Clinical Pharmacy Specialist

## 2014-05-28 DIAGNOSIS — Z7901 Long term (current) use of anticoagulants: Secondary | ICD-10-CM

## 2014-05-28 DIAGNOSIS — I4891 Unspecified atrial fibrillation: Secondary | ICD-10-CM

## 2014-05-28 LAB — POCT INR: INR: 1.8

## 2014-06-04 ENCOUNTER — Encounter: Payer: Self-pay | Admitting: *Deleted

## 2014-06-17 ENCOUNTER — Ambulatory Visit (INDEPENDENT_AMBULATORY_CARE_PROVIDER_SITE_OTHER): Payer: Medicare Other | Admitting: *Deleted

## 2014-06-17 DIAGNOSIS — Z7901 Long term (current) use of anticoagulants: Secondary | ICD-10-CM

## 2014-06-17 DIAGNOSIS — I4891 Unspecified atrial fibrillation: Secondary | ICD-10-CM

## 2014-06-17 LAB — POCT INR: INR: 2.2

## 2014-07-22 ENCOUNTER — Other Ambulatory Visit: Payer: Self-pay | Admitting: Cardiovascular Disease

## 2014-07-22 ENCOUNTER — Encounter: Payer: Self-pay | Admitting: *Deleted

## 2014-07-28 ENCOUNTER — Ambulatory Visit (INDEPENDENT_AMBULATORY_CARE_PROVIDER_SITE_OTHER): Payer: Medicare Other | Admitting: Pharmacist Clinician (PhC)/ Clinical Pharmacy Specialist

## 2014-07-28 DIAGNOSIS — I4891 Unspecified atrial fibrillation: Secondary | ICD-10-CM

## 2014-07-28 DIAGNOSIS — Z7901 Long term (current) use of anticoagulants: Secondary | ICD-10-CM

## 2014-07-28 LAB — POCT INR: INR: 2.1

## 2014-08-22 ENCOUNTER — Telehealth: Payer: Self-pay | Admitting: Cardiovascular Disease

## 2014-08-22 ENCOUNTER — Encounter: Payer: Self-pay | Admitting: Cardiovascular Disease

## 2014-08-22 ENCOUNTER — Ambulatory Visit (INDEPENDENT_AMBULATORY_CARE_PROVIDER_SITE_OTHER): Payer: Medicare Other | Admitting: Pharmacist Clinician (PhC)/ Clinical Pharmacy Specialist

## 2014-08-22 ENCOUNTER — Ambulatory Visit (INDEPENDENT_AMBULATORY_CARE_PROVIDER_SITE_OTHER): Payer: Medicare Other | Admitting: Cardiovascular Disease

## 2014-08-22 VITALS — BP 136/72 | HR 78 | Resp 16 | Ht 68.0 in | Wt 168.7 lb

## 2014-08-22 DIAGNOSIS — R079 Chest pain, unspecified: Secondary | ICD-10-CM

## 2014-08-22 DIAGNOSIS — I4891 Unspecified atrial fibrillation: Secondary | ICD-10-CM

## 2014-08-22 DIAGNOSIS — E785 Hyperlipidemia, unspecified: Secondary | ICD-10-CM

## 2014-08-22 DIAGNOSIS — Z7901 Long term (current) use of anticoagulants: Secondary | ICD-10-CM

## 2014-08-22 DIAGNOSIS — Z950 Presence of cardiac pacemaker: Secondary | ICD-10-CM

## 2014-08-22 LAB — POCT INR: INR: 2.1

## 2014-08-22 NOTE — Telephone Encounter (Signed)
Pt was calling in stating that Pamala Hurry called and left him a message wanting him to call her back at the office as soon as possible. Please call back  thanks

## 2014-08-22 NOTE — Patient Instructions (Signed)
DECREASE Metoprolol to 1/2 tablet once a day for one week then STOP taking.  STOP on Friday 08/29/14.  Your physician has requested that you have a lexiscan myoview. For further information please visit [email or web address]. Please follow instruction sheet, as given.  Dr. Sallyanne Kuster recommends that you schedule a follow-up appointment in: 3 months.

## 2014-08-22 NOTE — Telephone Encounter (Signed)
Pt. Wants you to call him back about the message you called him about earlier

## 2014-08-25 ENCOUNTER — Encounter: Payer: Self-pay | Admitting: Cardiovascular Disease

## 2014-08-25 NOTE — Telephone Encounter (Signed)
Can this be closed ?

## 2014-08-25 NOTE — Telephone Encounter (Signed)
States scheduling called about the nuclear stress test appt.

## 2014-08-25 NOTE — Progress Notes (Signed)
Patient ID: Bradley Nixon, male   DOB: [birth year], 81 y.o.   MRN: [id number]      Reason for office visit Permanent atrial fibrillation, pacemaker, anticoagulation therapy, hyperlipidemia  Bradley Nixon is here today, as always accompanied by his wife, Bradley Nixon, also our patient. He generally feels well and denies chest pain, dyspnea, edema or syncope. He has occasional bouts of vertigo that seemed to respond to Dramamine.  He takes warfarin anticoagulation for permanent atrial fibrillation. He has a dual-chamber permanent pacemaker [employer] Jude Zephyr XL  that was placed for sinus node dysfunction, but is now programmed has a single-chamber ventricular device for permanent atrial fibrillation. About a year ago we stopped the amiodarone therapy since he had very high frequency ventricular pacing. After the amiodarone fully wore off, ventricular pacing frequency is much lower now at only 20%. His pacemaker has problems with chronically high pacing thresholds on the ventricular lead but we have decided to continue with her current system until it is time for generator change out anticipated to occur within the next couple of years. Roughly 15 years ago when he had complete expressive aphasia and right-sided hemiparesis for about 12 hours. His neurological symptoms resolved spontaneously.  He had normal coronary arteries by angiography in 2009.  Echo in June 2015 showed EF 55-60%, dilated LA.  I had recommended that he reduce his dose of metoprolol but he is still taking a full 25 mg daily. He has 31% ventricular pacing with a lower rate limit set at 60 bpm. The ventricular lead parameters continued to deteriorate. Today ventricular capture threshold is 3.25 V at 1 ms pulse width, the R waves are 4.4-5.2 mV and impedance is 504 ohm. Roughly 1-2 0.25 years her left until ERI. Heart rate histogram distribution is favorable. No ventricular arrhythmias seen.  Allergies  Allergen Reactions  . Zocor [Simvastatin]    Weakness     Current Outpatient Prescriptions  Medication Sig Dispense Refill  . aspirin EC 81 MG tablet Take 81 mg by mouth daily.    Marland Kitchen atorvastatin (LIPITOR) 10 MG tablet Take 1 tablet (10 mg total) by mouth every evening. 90 tablet 1  . cyanocobalamin (,VITAMIN B-12,) 1000 MCG/ML injection Inject 1 mL into the skin every 30 (thirty) days.    . DimenhyDRINATE (DRAMAMINE PO) Take 1 tablet by mouth as needed.    . docusate sodium 100 MG CAPS Take 100 mg by mouth 2 (two) times daily. 20 capsule 0  . finasteride (PROSCAR) 5 MG tablet Take 5 mg by mouth every evening.      . fish oil-omega-3 fatty acids 1000 MG capsule Take 1 g by mouth every evening.      . Lansoprazole (PREVACID PO) Take by mouth daily.    Marland Kitchen levothyroxine (SYNTHROID, LEVOTHROID) 50 MCG tablet TAKE 1 TABLET EVERY DAY 90 tablet 3  . metoprolol succinate (TOPROL XL) 25 MG 24 hr tablet Take 0.5 tablets (12.5 mg total) by mouth daily. 90 tablet 3  . Multiple Vitamin (MULITIVITAMIN WITH MINERALS) TABS Take 1 tablet by mouth every evening.      . Tamsulosin HCl (FLOMAX) 0.4 MG CAPS Take 0.4 mg by mouth every evening.      . traMADol (ULTRAM) 50 MG tablet Take 50 mg by mouth every 8 (eight) hours as needed.    . warfarin (COUMADIN) 5 MG tablet TAKE 1 TO 1 AND 1/2 TABLETS  DAILY OR AS DIRECTED BY COUMADIN CLINIC 135 tablet 1   No current facility-administered medications for  this visit.    Past Medical History  Diagnosis Date  . Coronary artery disease   . Myocardial infarction   . Anginal pain   . Hypothyroidism   . Shortness of breath   . Pacemaker 05/02/2007    [employer].Jude  . Pneumonia   . Cancer   . Arthritis   . Complication of anesthesia hard to arouse after anesthesia  . Permanent atrial fibrillation     Past Surgical History  Procedure Laterality Date  . Cardioversion  07/28/2011    Procedure: CARDIOVERSION;  Surgeon: Leonie Man;  Location: [facility] OR;  Service: Cardiovascular;  Laterality: N/A;  . Joint  replacement    . Permanent pacemaker insertion  05/02/2007    [employer].Jude  . US echocardiography  09/17/2010    LA moderately dilated,mild TR, EF >55%  . Nm myocar perf wall motion  02/09/2010    no ischemia    Family History  Problem Relation Age of Onset  . Heart failure Mother   . Heart attack Father     History   Social History  . Marital Status: Married    Spouse Name: N/A    Number of Children: N/A  . Years of Education: N/A   Occupational History  . Not on file.   Social History Main Topics  . Smoking status: Never Smoker   . Smokeless tobacco: Not on file  . Alcohol Use: No  . Drug Use: No  . Sexual Activity: Not Currently   Other Topics Concern  . Not on file   Social History Narrative    Review of systems: The patient specifically denies any chest pain at rest or with exertion, dyspnea at rest or with exertion, orthopnea, paroxysmal nocturnal dyspnea, syncope, palpitations, focal neurological deficits, intermittent claudication, lower extremity edema, unexplained weight gain, cough, hemoptysis or wheezing.  The patient also denies abdominal pain, nausea, vomiting, dysphagia, diarrhea, constipation, polyuria, polydipsia, dysuria, hematuria, frequency, urgency, abnormal bleeding or bruising, fever, chills, unexpected weight changes, mood swings, change in skin or hair texture, change in voice quality, auditory or visual problems, allergic reactions or rashes, new musculoskeletal complaints other than usual "aches and pains".   PHYSICAL EXAM BP 136/72 mmHg  Pulse 78  Resp 16  Ht 5\' 8"  (1.727 m)  Wt 168 lb 11.2 oz (76.522 kg)  BMI 25.66 kg/m2 General: Alert, oriented x3, no distress  Head: no evidence of trauma, PERRL, EOMI, no exophtalmos or lid lag, no myxedema, no xanthelasma; normal ears, nose and oropharynx  Neck: normal jugular venous pulsations and no hepatojugular reflux; brisk carotid pulses without delay and no carotid bruits  Chest: clear to  auscultation, no signs of consolidation by percussion or palpation, normal fremitus, symmetrical and full respiratory excursions; right subclavian pacemaker site appears healthy  Cardiovascular: normal position and quality of the apical impulse, irregular rhythm, normal first and widely split second heart sounds, no murmurs, rubs or gallops  Abdomen: no tenderness or distention, no masses by palpation, no abnormal pulsatility or arterial bruits, normal bowel sounds, no hepatosplenomegaly  Extremities: no clubbing, cyanosis or edema; 2+ radial, ulnar and brachial pulses bilaterally; 2+ right femoral, posterior tibial and dorsalis pedis pulses; 2+ left femoral, posterior tibial and dorsalis pedis pulses; no subclavian or femoral bruits  Neurological: grossly nonfocal    Lipid Panel     Component Value Date/Time   CHOL  11/18/2007 0930    135        ATP III CLASSIFICATION:  <200  mg/dL   Desirable  200-239  mg/dL   Borderline High  >=240    mg/dL   High   TRIG 102 11/18/2007 0930   HDL 38* 11/18/2007 0930   CHOLHDL 3.6 11/18/2007 0930   VLDL 20 11/18/2007 0930   LDLCALC  11/18/2007 0930    77        Total Cholesterol/HDL:CHD Risk Coronary Heart Disease Risk Table                     Men   Women  1/2 Average Risk   3.4   3.3    BMET    Component Value Date/Time   NA 140 06/24/2012 0500   K 4.2 06/24/2012 0500   CL 105 06/24/2012 0500   CO2 26 06/24/2012 0500   GLUCOSE 109* 06/24/2012 0500   BUN 14 06/24/2012 0500   CREATININE 0.80 06/24/2012 0500   CALCIUM 8.5 06/24/2012 0500   GFRNONAA 77* 06/24/2012 0500   GFRAA 89* 06/24/2012 0500     ASSESSMENT AND PLAN  Carsyn has atrial fibrillation with controlled ventricular response and infrequent ventricular pacing. I wonder whether he truly still needs pacing therapy. We'll reduce the lower rate limit to 50 bpm and will wean him off the beta blocker altogether. I suspect that most of his ventricular pacing occurs at night or  is related to the long pauses inherent to irregular atrial fibrillation. Will have to make a decision whether or not he still needs a pacemaker before the current generator ERI which is anticipated and roughly one year. If pacemaker therapy still needed he will require an entirely new system with a new ventricular lead since the currently has very poor and slowly deteriorating parameters. Continue anticoagulation without interruption. He has a remote history of a lengthy TIA, possibly related to ignored atrial fibrillation.  Orders Placed This Encounter  Procedures  . Myocardial Perfusion Imaging  . EKG 12-Lead   No orders of the defined types were placed in this encounter.    Holli Humbles, MD, [place] [phone] office [phone] pager

## 2014-08-27 LAB — MDC_IDC_ENUM_SESS_TYPE_INCLINIC
Battery Voltage: 2.74 V
Brady Statistic RV Percent Paced: 31 %
Lead Channel Impedance Value: 504 Ohm
Lead Channel Pacing Threshold Pulse Width: 1 ms
Lead Channel Sensing Intrinsic Amplitude: 4.4 mV
Lead Channel Setting Pacing Amplitude: 5 V
MDC IDC MSMT LEADCHNL RV PACING THRESHOLD AMPLITUDE: 3.25 V
MDC IDC PG SERIAL: 1984739
MDC IDC SET LEADCHNL RV PACING PULSEWIDTH: 1 ms
MDC IDC SET LEADCHNL RV SENSING SENSITIVITY: 2 mV

## 2014-09-15 ENCOUNTER — Telehealth: Payer: Self-pay | Admitting: Cardiovascular Disease

## 2014-09-15 NOTE — Telephone Encounter (Signed)
Bradley Nixon is calling because he is having a test on Friday and has a couple of questions . Please call   Thanks

## 2014-09-15 NOTE — Telephone Encounter (Signed)
Called patient and he is requesting an exact dollar amount that he will be responsible for.  He is stating that he will cancel the test if I cannot give him this exact information by 10:00 tomorrow.  I reached out to our billing manager, DJ and she advised me that she would research and follow up with him.

## 2014-09-15 NOTE — Telephone Encounter (Signed)
Pt called, actually has billing related question. Uncertain how his upcoming stress test is billed and needed clarification. Please f/u.

## 2014-09-17 ENCOUNTER — Telehealth (HOSPITAL_COMMUNITY): Payer: Self-pay

## 2014-09-17 NOTE — Telephone Encounter (Signed)
Encounter complete. 

## 2014-09-17 NOTE — Telephone Encounter (Signed)
Forwarded to [facility]

## 2014-09-18 ENCOUNTER — Telehealth (HOSPITAL_COMMUNITY): Payer: Self-pay

## 2014-09-18 NOTE — Telephone Encounter (Signed)
Encounter complete. 

## 2014-09-19 ENCOUNTER — Ambulatory Visit (HOSPITAL_COMMUNITY): Payer: Medicare Other

## 2014-09-19 ENCOUNTER — Encounter (HOSPITAL_COMMUNITY): Payer: Medicare Other

## 2014-09-19 ENCOUNTER — Ambulatory Visit (INDEPENDENT_AMBULATORY_CARE_PROVIDER_SITE_OTHER): Payer: Medicare Other | Admitting: Pharmacist Clinician (PhC)/ Clinical Pharmacy Specialist

## 2014-09-19 ENCOUNTER — Ambulatory Visit (HOSPITAL_COMMUNITY)
Admission: RE | Admit: 2014-09-19 | Discharge: 2014-09-19 | Disposition: A | Discharge status: Home / Self Care | Payer: Medicare Other | Source: Ambulatory Visit | Attending: Cardiology | Admitting: Cardiology

## 2014-09-19 DIAGNOSIS — I2510 Atherosclerotic heart disease of native coronary artery without angina pectoris: Secondary | ICD-10-CM | POA: Diagnosis not present

## 2014-09-19 DIAGNOSIS — Z8249 Family history of ischemic heart disease and other diseases of the circulatory system: Secondary | ICD-10-CM | POA: Diagnosis not present

## 2014-09-19 DIAGNOSIS — R079 Chest pain, unspecified: Secondary | ICD-10-CM | POA: Diagnosis not present

## 2014-09-19 DIAGNOSIS — R5383 Other fatigue: Secondary | ICD-10-CM | POA: Insufficient documentation

## 2014-09-19 DIAGNOSIS — I252 Old myocardial infarction: Secondary | ICD-10-CM | POA: Diagnosis not present

## 2014-09-19 DIAGNOSIS — I4891 Unspecified atrial fibrillation: Secondary | ICD-10-CM

## 2014-09-19 DIAGNOSIS — R42 Dizziness and giddiness: Secondary | ICD-10-CM | POA: Diagnosis not present

## 2014-09-19 DIAGNOSIS — Z7901 Long term (current) use of anticoagulants: Secondary | ICD-10-CM

## 2014-09-19 DIAGNOSIS — R0602 Shortness of breath: Secondary | ICD-10-CM | POA: Insufficient documentation

## 2014-09-19 LAB — POCT INR: INR: 2.2

## 2014-09-19 MED ORDER — TECHNETIUM TC 99M SESTAMIBI GENERIC - CARDIOLITE
30.6000 | Freq: Once | INTRAVENOUS | Status: AC | PRN
  Administered 2014-09-19: 31 via INTRAVENOUS

## 2014-09-19 MED ORDER — AMINOPHYLLINE 25 MG/ML IV SOLN
150.0000 mg | Freq: Once | INTRAVENOUS | Status: AC
  Administered 2014-09-19: 150 mg via INTRAVENOUS

## 2014-09-19 MED ORDER — REGADENOSON 0.4 MG/5ML IV SOLN
0.4000 mg | Freq: Once | INTRAVENOUS | Status: AC
  Administered 2014-09-19: 0.4 mg via INTRAVENOUS

## 2014-09-19 MED ORDER — TECHNETIUM TC 99M SESTAMIBI GENERIC - CARDIOLITE
10.9000 | Freq: Once | INTRAVENOUS | Status: AC | PRN
  Administered 2014-09-19: 10.9 via INTRAVENOUS

## 2014-09-19 NOTE — Procedures (Addendum)
[facility] [address] [place] [address] [phone]  Cardiology Nuclear Med Study  Bradley Nixon is a 81 y.o. male     MRN : [id number]     DOB: [birth year]  Procedure Date: 09/19/2014  Nuclear Med Background Indication for Stress Test:  Evaluation for Ischemia and Follow up CAD History:  CAD;AFIB;MI;Last NUC MPI on 02/09/2010-normal;EF=77%;PTVP;No prior respiratory history reported Cardiac Risk Factors: Family History - CAD, Lipids and TIA  Symptoms:  Dizziness, Fatigue and SOB   Nuclear Pre-Procedure Caffeine/Decaff Intake:  9:00pm NPO After: 5:00am   IV Site: R Forearm  IV 0.9% NS with Angio Cath:  22g  Chest Size (in):  44"  IV Started by: Bradley Course, RN  Height: 5\' 8"  (1.727 m)  Cup Size: n/a  BMI:  Body mass index is 25.55 kg/(m^2). Weight:  168 lb (76.204 kg)   Tech Comments:  n/a    Nuclear Med Study 1 or 2 day study: 1 day  Stress Test Type:  [place] Provider:  Sanda Klein, MD   Resting Radionuclide: Technetium 84m Sestamibi  Resting Radionuclide Dose: 10.9 mCi   Stress Radionuclide:  Technetium 18m Sestamibi  Stress Radionuclide Dose: 30.6 mCi           Stress Protocol Rest HR:82 Stress HR: 130  Rest BP:132/77 Stress BP: 169/92  Exercise Time (min): n/a METS: n/a          Dose of Adenosine (mg):  n/a Dose of Lexiscan: 0.4 mg  Dose of Atropine (mg): n/a Dose of Dobutamine: n/a mcg/kg/min (at max HR)  Stress Test Technologist: Mellody Memos, CCT Nuclear Technologist: Imagene Riches, CNMT   Rest Procedure:  Myocardial perfusion imaging was performed at rest 45 minutes following the intravenous administration of Technetium 61m Sestamibi. Stress Procedure:  The patient received IV Lexiscan 0.4 mg over 15-seconds.  Technetium 55m Sestamibi injected IV at 30-seconds.  Patient experienced shortness of breath, stomach pains, nausea, vomiting, chills, pressure behind his eyes and  head pain. He was administered 150 mg of Aminophylline IV. There were no significant changes with Lexiscan.  Quantitative spect images were obtained after a 45 minute delay.  Transient Ischemic Dilatation (Normal <1.22):  1.13  QGS EDV:  n/a ml QGS ESV:  n/a ml LV Ejection Fraction: Study not gated    Rest ECG: Atrial fibrillation, RBBB  Stress ECG: No significant ST segment change suggestive of ischemia.  QPS Raw Data Images:  Acquisition technically good; normal left ventricular size. Stress Images:  There is decreased uptake in the inferior wall. Rest Images:  There is decreased uptake in the inferior wall. Subtraction (SDS):  No evidence of ischemia.  Impression Exercise Capacity:  Lexiscan with no exercise. BP Response:  Normal blood pressure response. Clinical Symptoms:  There is dyspnea. ECG Impression:  No significant ST segment change suggestive of ischemia. Comparison with Prior Nuclear Study: No significant change from previous study  Overall Impression:  Low risk stress nuclear study with a small, mild, fixed inferior basal defect consistent with thinning; no ischemia.  LV Wall Motion:  Study not gated due to atrial fibrillation.   Kirk Ruths, MD  09/19/2014 12:49 PM

## 2014-09-23 ENCOUNTER — Telehealth: Payer: Self-pay | Admitting: Cardiovascular Disease

## 2014-09-23 NOTE — Telephone Encounter (Signed)
Bradley Nixon iscalling you back about his Stress Test .. Please Call

## 2014-09-23 NOTE — Telephone Encounter (Signed)
Returned call to patient myoview results given. 

## 2014-10-17 ENCOUNTER — Ambulatory Visit: Payer: Medicare Other | Admitting: Pharmacist Clinician (PhC)/ Clinical Pharmacy Specialist

## 2014-10-27 ENCOUNTER — Ambulatory Visit: Payer: Medicare Other | Admitting: Pharmacist Clinician (PhC)/ Clinical Pharmacy Specialist

## 2014-10-30 ENCOUNTER — Telehealth: Payer: Self-pay | Admitting: Pharmacist Clinician (PhC)/ Clinical Pharmacy Specialist

## 2014-10-30 NOTE — Telephone Encounter (Signed)
Spoke with patient and wife.  Rescheduled from next Monday am, they cannot drive with morning traffic.  Switched appts to Thursday at 2:10pm

## 2014-10-30 NOTE — Telephone Encounter (Signed)
-----   Message from Romana Juniper sent at 10/27/2014 11:51 AM EDT ----- Geraldo Docker,   Mr. Vangilder would like for you to call him , he has some questions. He is unable to come in today because of his Vertigo .    Thanks   [facility]

## 2014-11-03 ENCOUNTER — Ambulatory Visit: Payer: Medicare Other | Admitting: Pharmacist Clinician (PhC)/ Clinical Pharmacy Specialist

## 2014-11-06 ENCOUNTER — Other Ambulatory Visit: Payer: Self-pay | Admitting: Pharmacist Clinician (PhC)/ Clinical Pharmacy Specialist

## 2014-11-06 ENCOUNTER — Ambulatory Visit (INDEPENDENT_AMBULATORY_CARE_PROVIDER_SITE_OTHER): Payer: Medicare Other | Admitting: Pharmacist Clinician (PhC)/ Clinical Pharmacy Specialist

## 2014-11-06 DIAGNOSIS — I4891 Unspecified atrial fibrillation: Secondary | ICD-10-CM

## 2014-11-06 DIAGNOSIS — Z7901 Long term (current) use of anticoagulants: Secondary | ICD-10-CM

## 2014-11-06 LAB — POCT INR: INR: 2.1

## 2014-11-06 MED ORDER — WARFARIN SODIUM 5 MG PO TABS: ORAL_TABLET | ORAL | Status: DC

## 2014-11-06 MED ORDER — ATORVASTATIN CALCIUM 10 MG PO TABS: 10.0000 mg | ORAL_TABLET | Freq: Every evening | ORAL | Status: DC

## 2014-11-11 NOTE — Op Note (Signed)
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   PROCEDURE: Advancement rotation flap of right posterior neck/scalp (3 x 4 cm defect)   SURGEON: Janalee Dane, M.D.   DESCRIPTION OF PROCEDURE: The patient was placed in a prone position on the operating room table. After IV sedation had been instilled, the area was locally anesthetized, prepped and draped in the usual fashion with Betadine. A backcut was made along the hairline and multiple Burroughs triangles were taken superiorly. Inferiorly based Burroughs triangle of tissue was taken out to allow for advancement rotation of the medial flap laterally and the lateral flap medially in an "O-TO-T" type flap. The flaps were secured with deep subcutaneous 3-0 Vicryl, 4-0 Vicryl, and the skin was closed with interrupted 4-0 nylon and 5-0 nylon sutures. In the hair-bearing scalp surgical clips were used. The wound was cleaned and then dressed with bacitracin. Ice pack was placed against the wound. The patient was taken from the Operating Room to the Recovery Room in stable condition. There were no complications. Estimated blood loss 25 mL.  ____________________________ J. Nadeen Landau, MD jmc:slb D: 05/10/2012 15:02:25 ET T: 05/10/2012 15:50:51 ET JOB#: [id number]  cc: Janalee Dane, MD, <Dictator> Nicholos Johns MD ELECTRONICALLY SIGNED 05/24/2012 10:42

## 2014-12-04 ENCOUNTER — Ambulatory Visit: Payer: Medicare Other | Admitting: Pharmacist Clinician (PhC)/ Clinical Pharmacy Specialist

## 2014-12-10 ENCOUNTER — Ambulatory Visit: Payer: Medicare Other | Admitting: Pharmacist Clinician (PhC)/ Clinical Pharmacy Specialist

## 2015-01-14 ENCOUNTER — Ambulatory Visit (INDEPENDENT_AMBULATORY_CARE_PROVIDER_SITE_OTHER): Payer: Medicare Other | Admitting: Pharmacist

## 2015-01-14 DIAGNOSIS — Z7901 Long term (current) use of anticoagulants: Secondary | ICD-10-CM | POA: Diagnosis not present

## 2015-01-14 DIAGNOSIS — I4891 Unspecified atrial fibrillation: Secondary | ICD-10-CM | POA: Diagnosis not present

## 2015-01-14 LAB — POCT INR: INR: 2.2

## 2015-01-20 ENCOUNTER — Encounter: Payer: Medicare Other | Admitting: Cardiovascular Disease

## 2015-01-23 ENCOUNTER — Ambulatory Visit: Payer: Medicare Other | Admitting: Pharmacist Clinician (PhC)/ Clinical Pharmacy Specialist

## 2015-02-25 ENCOUNTER — Ambulatory Visit (INDEPENDENT_AMBULATORY_CARE_PROVIDER_SITE_OTHER): Payer: Medicare Other | Admitting: Pharmacist Clinician (PhC)/ Clinical Pharmacy Specialist

## 2015-02-25 DIAGNOSIS — Z7901 Long term (current) use of anticoagulants: Secondary | ICD-10-CM | POA: Diagnosis not present

## 2015-02-25 DIAGNOSIS — I4891 Unspecified atrial fibrillation: Secondary | ICD-10-CM

## 2015-02-25 LAB — POCT INR: INR: 2.1

## 2015-03-25 ENCOUNTER — Ambulatory Visit (INDEPENDENT_AMBULATORY_CARE_PROVIDER_SITE_OTHER): Payer: Medicare Other | Admitting: Pharmacist Clinician (PhC)/ Clinical Pharmacy Specialist

## 2015-03-25 DIAGNOSIS — I4891 Unspecified atrial fibrillation: Secondary | ICD-10-CM

## 2015-03-25 DIAGNOSIS — Z7901 Long term (current) use of anticoagulants: Secondary | ICD-10-CM | POA: Diagnosis not present

## 2015-03-25 LAB — POCT INR: INR: 1.6

## 2015-03-25 MED ORDER — LEVOTHYROXINE SODIUM 50 MCG PO TABS: 50.0000 ug | ORAL_TABLET | Freq: Every day | ORAL | Status: DC

## 2015-04-02 ENCOUNTER — Other Ambulatory Visit: Payer: Self-pay | Admitting: Cardiovascular Disease

## 2015-04-15 ENCOUNTER — Ambulatory Visit (INDEPENDENT_AMBULATORY_CARE_PROVIDER_SITE_OTHER): Payer: Medicare Other | Admitting: Pharmacist Clinician (PhC)/ Clinical Pharmacy Specialist

## 2015-04-15 ENCOUNTER — Ambulatory Visit (INDEPENDENT_AMBULATORY_CARE_PROVIDER_SITE_OTHER): Payer: Medicare Other | Admitting: Cardiovascular Disease

## 2015-04-15 ENCOUNTER — Encounter: Payer: Medicare Other | Admitting: Cardiovascular Disease

## 2015-04-15 ENCOUNTER — Encounter: Payer: Self-pay | Admitting: Cardiovascular Disease

## 2015-04-15 VITALS — BP 118/68 | HR 79 | Ht 68.0 in | Wt 161.0 lb

## 2015-04-15 DIAGNOSIS — Z7901 Long term (current) use of anticoagulants: Secondary | ICD-10-CM

## 2015-04-15 DIAGNOSIS — Z950 Presence of cardiac pacemaker: Secondary | ICD-10-CM

## 2015-04-15 DIAGNOSIS — I4891 Unspecified atrial fibrillation: Secondary | ICD-10-CM | POA: Diagnosis not present

## 2015-04-15 DIAGNOSIS — E785 Hyperlipidemia, unspecified: Secondary | ICD-10-CM

## 2015-04-15 LAB — POCT INR: INR: 2.2

## 2015-04-15 NOTE — Progress Notes (Signed)
Patient ID: Bradley Nixon, male   DOB: 29-Sep-1920, 81 y.o.   MRN: [id number]     Cardiology Office Note   Date:  04/15/2015   ID:  Bradley Nixon, DOB 02/20/1921, MRN [id number]  PCP:  Myriam Jacobson, MD  Cardiologist:   Sanda Klein, MD   Chief Complaint  Patient presents with  . PHYS PACER CHECK  . Dizziness    when he lay back  . Shortness of Breath      History of Present Illness: Bradley Nixon is a 81 y.o. male who presents for  Permanent atrial fibrillation, pacemaker check.   we have gradually reduced Mendell's doses of negative chronotropic agents and he does not really need pacing any longer. His device was initially implanted for sinus node dysfunction he has never had significant AV block. He now has permanent atrial fibrillation. Ventricular pacing now occurs less than 4% of the time. Estimated pacemaker generator longevity is 0.5-3 years (battery voltage 2.69 V). No episodes of rapid ventricular response have been recorded. Today he has atrial fibrillation with a ventricular rate of 79 bpm and no ventricular pacing.   He has not had recent falls or any bleeding complications while on warfarin anticoagulation.  He takes warfarin anticoagulation for permanent atrial fibrillation. He has a dual-chamber permanent pacemaker [employer] Zephyr XL that was placed for sinus node dysfunction, but is now programmed has a single-chamber ventricular device for permanent atrial fibrillation.  His pacemaker has problems with chronically high pacing thresholds on the ventricular lead. Roughly 15 years ago when he had complete expressive aphasia and right-sided hemiparesis for about 12 hours. His neurological symptoms resolved spontaneously.  He had normal coronary arteries by angiography in 2009.  Echo in June 2015 showed EF 55-60%, dilated LA.    Past Medical History  Diagnosis Date  . Coronary artery disease   . Myocardial infarction   . Anginal pain   . Hypothyroidism   .  Shortness of breath   . Pacemaker 05/02/2007    [employer]  . Pneumonia   . Cancer   . Arthritis   . Complication of anesthesia hard to arouse after anesthesia  . Permanent atrial fibrillation     Past Surgical History  Procedure Laterality Date  . Cardioversion  07/28/2011    Procedure: CARDIOVERSION;  Surgeon: Leonie Man;  Location: [facility] OR;  Service: Cardiovascular;  Laterality: N/A;  . Joint replacement    . Permanent pacemaker insertion  05/02/2007    [employer]  . US echocardiography  09/17/2010    LA moderately dilated,mild TR, EF >55%  . Nm myocar perf wall motion  02/09/2010    no ischemia     Current Outpatient Prescriptions  Medication Sig Dispense Refill  . aspirin EC 81 MG tablet Take 81 mg by mouth daily.    Marland Kitchen atorvastatin (LIPITOR) 10 MG tablet TAKE 1 TABLET EVERY EVENING 90 tablet 0  . cyanocobalamin (,VITAMIN B-12,) 1000 MCG/ML injection Inject 1 mL into the skin every 30 (thirty) days.    . DimenhyDRINATE (DRAMAMINE PO) Take 1 tablet by mouth as needed.    . docusate sodium 100 MG CAPS Take 100 mg by mouth 2 (two) times daily. 20 capsule 0  . finasteride (PROSCAR) 5 MG tablet Take 5 mg by mouth every evening.      . fish oil-omega-3 fatty acids 1000 MG capsule Take 1 g by mouth every evening.      . Lansoprazole (PREVACID PO) Take by mouth  daily.    . levothyroxine (SYNTHROID, LEVOTHROID) 50 MCG tablet Take 1 tablet (50 mcg total) by mouth daily. 30 tablet 0  . Multiple Vitamin (MULITIVITAMIN WITH MINERALS) TABS Take 1 tablet by mouth every evening.      . Tamsulosin HCl (FLOMAX) 0.4 MG CAPS Take 0.4 mg by mouth every evening.      . traMADol (ULTRAM) 50 MG tablet Take 50 mg by mouth every 8 (eight) hours as needed.    . warfarin (COUMADIN) 5 MG tablet TAKE 1 TO 1 AND 1/2 TABLETS  DAILY OR AS DIRECTED BY COUMADIN CLINIC 135 tablet 1   No current facility-administered medications for this visit.    Allergies:   Zocor    Social History:  The patient  reports that  he has never smoked. He does not have any smokeless tobacco history on file. He reports that he does not drink alcohol or use illicit drugs.   Family History:  The patient's family history includes Heart attack in his father; Heart failure in his mother.    ROS:  Please see the history of present illness.    Otherwise, review of systems positive for none.   All other systems are reviewed and negative.    PHYSICAL EXAM: VS:  BP 96/62 mmHg  Pulse 79  Ht '5\' 8"'$  (1.727 m)  Wt 161 lb (73.029 kg)  BMI 24.49 kg/m2 , BMI Body mass index is 24.49 kg/(m^2).  General: Alert, oriented x3, no distress Head: no evidence of trauma, PERRL, EOMI, no exophtalmos or lid lag, no myxedema, no xanthelasma; normal ears, nose and oropharynx Neck: normal jugular venous pulsations and no hepatojugular reflux; brisk carotid pulses without delay and no carotid bruits Chest: clear to auscultation, no signs of consolidation by percussion or palpation, normal fremitus, symmetrical and full respiratory excursions,  Healthy right subclavian pacemaker site Cardiovascular: normal position and quality of the apical impulse, irregular rhythm, normal first and second heart sounds, no murmurs, rubs or gallops Abdomen: no tenderness or distention, no masses by palpation, no abnormal pulsatility or arterial bruits, normal bowel sounds, no hepatosplenomegaly Extremities: no clubbing, cyanosis or edema; 2+ radial, ulnar and brachial pulses bilaterally; 2+ right femoral, posterior tibial and dorsalis pedis pulses; 2+ left femoral, posterior tibial and dorsalis pedis pulses; no subclavian or femoral bruits Neurological: grossly nonfocal Psych: euthymic mood, full affect   EKG:  EKG is ordered today. The ekg ordered today demonstrates  Atrial fibrillation, right bundle branch block   Recent Labs: No results found for requested labs within last 365 days.    Lipid Panel    Component Value Date/Time   CHOL  11/18/2007 0930     135        ATP III CLASSIFICATION:  <200     mg/dL   Desirable  200-239  mg/dL   Borderline High  >=240    mg/dL   High   TRIG 102 11/18/2007 0930   HDL 38* 11/18/2007 0930   CHOLHDL 3.6 11/18/2007 0930   VLDL 20 11/18/2007 0930   LDLCALC  11/18/2007 0930    77        Total Cholesterol/HDL:CHD Risk Coronary Heart Disease Risk Table                     Men   Women  1/2 Average Risk   3.4   3.3      Wt Readings from Last 3 Encounters:  04/15/15 161 lb (73.029 kg)  09/19/14  168 lb (76.204 kg)  08/22/14 168 lb 11.2 oz (76.522 kg)    .   ASSESSMENT AND PLAN:  1.  Atrial fibrillation with controlled ventricular response, currently on a very low dose of beta blocker. Will stop this altogether and expect this will lead to even less ventricular pacing. He does not have any recorded rapid ventricular response on his pacemaker. He is on appropriate warfarin anticoagulation. CHADSVasc score 4.  2.  Dual-chamber permanent pacemaker programmed VVIR for permanent atrial fibrillation with high ventricular pacing threshold. My assessment is that he no longer needs this device.  The device was initially implanted for sinus node dysfunction and he now has permanent atrial fibrillation. He does have right bundle branch block but has never had evidence of high degree atrioventricular block. The need for ventricular pacing will be even less after we stop his beta blocker altogether. Absent any new developments, when the current pacemaker generator reaches ERI, will recommend leaving the device as is and not replacing it.    Current medicines are reviewed at length with the patient today.  The patient does not have concerns regarding medicines.  The following changes have been made:   Stop metoprolol  Labs/ tests ordered today include:   Orders Placed This Encounter  Procedures  . EKG 12-Lead     Patient Instructions  Your physician has recommended you make the following change in your  medication: STOP METOPROLOL   Dr. Sallyanne Kuster recommends that you schedule a follow-up appointment in: [place] ([place]).        Mikael Spray, MD  04/15/2015 9:51 PM    Sanda Klein, MD, [facility] [phone] office [phone] pager

## 2015-04-15 NOTE — Patient Instructions (Addendum)
Your physician has recommended you make the following change in your medication: STOP METOPROLOL   Dr. Sallyanne Kuster recommends that you schedule a follow-up appointment in: [place] ([place]).

## 2015-05-05 LAB — CUP PACEART INCLINIC DEVICE CHECK
Battery Voltage: 2.69 V
Brady Statistic RV Percent Paced: 3.9 %
Date Time Interrogation Session: 20161011104812
Lead Channel Pacing Threshold Amplitude: 4.5 V
Lead Channel Pacing Threshold Pulse Width: 1 ms
Lead Channel Sensing Intrinsic Amplitude: 3.9 mV
Lead Channel Setting Pacing Amplitude: 5.5 V
Lead Channel Setting Pacing Pulse Width: 1 ms
Lead Channel Setting Sensing Sensitivity: 2 mV
MDC IDC MSMT LEADCHNL RV IMPEDANCE VALUE: 482 Ohm
MDC IDC PG SERIAL: 1984739

## 2015-05-06 ENCOUNTER — Ambulatory Visit: Payer: Medicare Other | Admitting: Pharmacist Clinician (PhC)/ Clinical Pharmacy Specialist

## 2015-05-11 ENCOUNTER — Ambulatory Visit: Payer: Medicare Other | Admitting: Pharmacist Clinician (PhC)/ Clinical Pharmacy Specialist

## 2015-05-19 ENCOUNTER — Encounter: Payer: Self-pay | Admitting: Cardiovascular Disease

## 2015-05-20 ENCOUNTER — Ambulatory Visit: Payer: Medicare Other | Admitting: Pharmacist Clinician (PhC)/ Clinical Pharmacy Specialist

## 2015-05-21 ENCOUNTER — Ambulatory Visit: Payer: Medicare Other | Admitting: Pharmacist Clinician (PhC)/ Clinical Pharmacy Specialist

## 2015-06-16 ENCOUNTER — Ambulatory Visit: Payer: Medicare Other | Admitting: Pharmacist Clinician (PhC)/ Clinical Pharmacy Specialist

## 2015-07-02 ENCOUNTER — Ambulatory Visit (INDEPENDENT_AMBULATORY_CARE_PROVIDER_SITE_OTHER): Payer: Medicare Other | Admitting: *Deleted

## 2015-07-02 DIAGNOSIS — I4891 Unspecified atrial fibrillation: Secondary | ICD-10-CM

## 2015-07-02 DIAGNOSIS — Z7901 Long term (current) use of anticoagulants: Secondary | ICD-10-CM

## 2015-07-02 LAB — POCT INR: INR: 2

## 2015-07-02 NOTE — Progress Notes (Signed)
Patient added to nurse schedule today. He was overdue for a coumadin clinic visit by ~2 months, had missed last 2 visits. States was instructed to come in for draw.  I checked INR which was in normal range, advised pt to continue current dosing & I would send home - if changes recommended would call. Pt's home telephone number verified at appt. Chart forwarded to [facility] for review.

## 2015-07-02 NOTE — Patient Instructions (Signed)
Pt instructed to continue current dosing - will have Erasmo Downer call if changes recommended/ scheduled f/u recommendation.

## 2015-07-27 ENCOUNTER — Other Ambulatory Visit: Payer: Self-pay | Admitting: Cardiovascular Disease

## 2015-07-28 NOTE — Telephone Encounter (Signed)
Rx request sent to pharmacy.  

## 2015-08-06 ENCOUNTER — Ambulatory Visit (INDEPENDENT_AMBULATORY_CARE_PROVIDER_SITE_OTHER): Payer: Medicare Other | Admitting: Pharmacist Clinician (PhC)/ Clinical Pharmacy Specialist

## 2015-08-06 DIAGNOSIS — Z7901 Long term (current) use of anticoagulants: Secondary | ICD-10-CM

## 2015-08-06 DIAGNOSIS — I4891 Unspecified atrial fibrillation: Secondary | ICD-10-CM | POA: Diagnosis not present

## 2015-08-18 ENCOUNTER — Telehealth: Payer: Self-pay | Admitting: *Deleted

## 2015-08-18 ENCOUNTER — Other Ambulatory Visit: Payer: Self-pay | Admitting: Otolaryngology

## 2015-08-18 ENCOUNTER — Ambulatory Visit
Admission: RE | Admit: 2015-08-18 | Discharge: 2015-08-18 | Disposition: A | Discharge status: Home / Self Care | Payer: Medicare Other | Source: Ambulatory Visit | Attending: Otolaryngology | Admitting: Otolaryngology

## 2015-08-18 DIAGNOSIS — R042 Hemoptysis: Secondary | ICD-10-CM

## 2015-08-18 DIAGNOSIS — R918 Other nonspecific abnormal finding of lung field: Secondary | ICD-10-CM

## 2015-08-18 NOTE — Telephone Encounter (Signed)
Patient instructed to stop the ASA 81 mg due to report from Dr. Ernesto Rutherford re: nose bleeds and coughing up blood per Dr. Loletha Grayer.

## 2015-08-21 ENCOUNTER — Ambulatory Visit
Admission: RE | Admit: 2015-08-21 | Discharge: 2015-08-21 | Disposition: A | Discharge status: Home / Self Care | Payer: Medicare Other | Source: Ambulatory Visit | Attending: Otolaryngology | Admitting: Otolaryngology

## 2015-08-21 ENCOUNTER — Other Ambulatory Visit: Payer: Self-pay | Admitting: Otolaryngology

## 2015-08-21 DIAGNOSIS — R918 Other nonspecific abnormal finding of lung field: Secondary | ICD-10-CM

## 2015-08-24 ENCOUNTER — Encounter: Payer: Medicare Other | Admitting: Pharmacist Clinician (PhC)/ Clinical Pharmacy Specialist

## 2015-08-24 ENCOUNTER — Telehealth: Payer: Self-pay | Admitting: *Deleted

## 2015-08-24 DIAGNOSIS — C3411 Malignant neoplasm of upper lobe, right bronchus or lung: Secondary | ICD-10-CM | POA: Insufficient documentation

## 2015-08-24 DIAGNOSIS — R918 Other nonspecific abnormal finding of lung field: Secondary | ICD-10-CM

## 2015-08-24 NOTE — Telephone Encounter (Signed)
Oncology Nurse Navigator Documentation  Oncology Nurse Navigator Flowsheets 08/24/2015  Navigator Location [facility]  Navigator Encounter Type Introductory phone call/I received referral on Bradley Nixon today.  I called and scheduled him to be seen on 09/03/15 arrive at 12:30 with labs at 12:45 and Dr. Julien Nordmann at 1:00.  Patient and wife verbalized understanding of appt time and place.   Barriers/Navigation Needs Coordination of Care  Interventions Coordination of Care  Coordination of Care Appts  Acuity Level 1  Time Spent with Patient 15

## 2015-08-28 ENCOUNTER — Ambulatory Visit (INDEPENDENT_AMBULATORY_CARE_PROVIDER_SITE_OTHER): Payer: Medicare Other | Admitting: Pharmacist Clinician (PhC)/ Clinical Pharmacy Specialist

## 2015-08-28 DIAGNOSIS — I4891 Unspecified atrial fibrillation: Secondary | ICD-10-CM | POA: Diagnosis not present

## 2015-08-28 DIAGNOSIS — Z7901 Long term (current) use of anticoagulants: Secondary | ICD-10-CM

## 2015-08-28 LAB — POCT INR: INR: 1.8

## 2015-09-02 ENCOUNTER — Telehealth: Payer: Self-pay | Admitting: *Deleted

## 2015-09-02 NOTE — Telephone Encounter (Signed)
Called pt and confirmed 09/03/15 clinic appt w/ him.

## 2015-09-03 ENCOUNTER — Ambulatory Visit (HOSPITAL_BASED_OUTPATIENT_CLINIC_OR_DEPARTMENT_OTHER): Payer: Medicare Other | Admitting: Internal Medicine

## 2015-09-03 ENCOUNTER — Other Ambulatory Visit (HOSPITAL_BASED_OUTPATIENT_CLINIC_OR_DEPARTMENT_OTHER): Payer: Medicare Other

## 2015-09-03 ENCOUNTER — Encounter: Payer: Self-pay | Admitting: *Deleted

## 2015-09-03 ENCOUNTER — Telehealth: Payer: Self-pay | Admitting: Internal Medicine

## 2015-09-03 ENCOUNTER — Ambulatory Visit: Payer: Medicare Other | Attending: Internal Medicine | Admitting: Physical Therapy

## 2015-09-03 VITALS — BP 113/63 | HR 98 | Temp 98.2°F | Resp 18 | Ht 68.0 in | Wt 153.9 lb

## 2015-09-03 DIAGNOSIS — I4891 Unspecified atrial fibrillation: Secondary | ICD-10-CM

## 2015-09-03 DIAGNOSIS — R918 Other nonspecific abnormal finding of lung field: Secondary | ICD-10-CM

## 2015-09-03 DIAGNOSIS — Z87891 Personal history of nicotine dependence: Secondary | ICD-10-CM

## 2015-09-03 DIAGNOSIS — R0609 Other forms of dyspnea: Secondary | ICD-10-CM | POA: Diagnosis not present

## 2015-09-03 DIAGNOSIS — R2681 Unsteadiness on feet: Secondary | ICD-10-CM | POA: Diagnosis present

## 2015-09-03 DIAGNOSIS — M545 Low back pain, unspecified: Secondary | ICD-10-CM

## 2015-09-03 DIAGNOSIS — Z9181 History of falling: Secondary | ICD-10-CM | POA: Diagnosis present

## 2015-09-03 DIAGNOSIS — M2560 Stiffness of unspecified joint, not elsewhere classified: Secondary | ICD-10-CM | POA: Diagnosis present

## 2015-09-03 DIAGNOSIS — Z8249 Family history of ischemic heart disease and other diseases of the circulatory system: Secondary | ICD-10-CM

## 2015-09-03 LAB — CBC WITH DIFFERENTIAL/PLATELET
BASO%: 0.3 % (ref 0.0–2.0)
Basophils Absolute: 0 10*3/uL (ref 0.0–0.1)
EOS ABS: 0.1 10*3/uL (ref 0.0–0.5)
EOS%: 0.8 % (ref 0.0–7.0)
HCT: 38 % — ABNORMAL LOW (ref 38.4–49.9)
HGB: 12.5 g/dL — ABNORMAL LOW (ref 13.0–17.1)
LYMPH%: 25.4 % (ref 14.0–49.0)
MCH: 30.4 pg (ref 27.2–33.4)
MCHC: 32.8 g/dL (ref 32.0–36.0)
MCV: 92.6 fL (ref 79.3–98.0)
MONO#: 0.8 10*3/uL (ref 0.1–0.9)
MONO%: 12.3 % (ref 0.0–14.0)
NEUT#: 4.1 10*3/uL (ref 1.5–6.5)
NEUT%: 61.2 % (ref 39.0–75.0)
Platelets: 198 10*3/uL (ref 140–400)
RBC: 4.1 10*6/uL — ABNORMAL LOW (ref 4.20–5.82)
RDW: 14.7 % — AB (ref 11.0–14.6)
WBC: 6.7 10*3/uL (ref 4.0–10.3)
lymph#: 1.7 10*3/uL (ref 0.9–3.3)

## 2015-09-03 LAB — COMPREHENSIVE METABOLIC PANEL
ALK PHOS: 68 U/L (ref 40–150)
ALT: 26 U/L (ref 0–55)
AST: 21 U/L (ref 5–34)
Albumin: 3.4 g/dL — ABNORMAL LOW (ref 3.5–5.0)
Anion Gap: 9 mEq/L (ref 3–11)
BILIRUBIN TOTAL: 0.58 mg/dL (ref 0.20–1.20)
BUN: 19.4 mg/dL (ref 7.0–26.0)
CALCIUM: 8.8 mg/dL (ref 8.4–10.4)
CO2: 23 mEq/L (ref 22–29)
CREATININE: 0.8 mg/dL (ref 0.7–1.3)
Chloride: 107 mEq/L (ref 98–109)
EGFR: 78 mL/min/{1.73_m2} — ABNORMAL LOW (ref >90)
GLUCOSE: 106 mg/dL (ref 70–140)
POTASSIUM: 4.4 meq/L (ref 3.5–5.1)
SODIUM: 139 meq/L (ref 136–145)
Total Protein: 7.1 g/dL (ref 6.4–8.3)

## 2015-09-03 NOTE — Progress Notes (Signed)
[place] Telephone:([phone]) [phone]   [phone] Multidisciplinary thoracic oncology clinic  CONSULT NOTE  REFERRING PHYSICIAN: Dr. Minna Merritts  REASON FOR CONSULTATION:  82 years old white male with questionable lung cancer.  HPI Bradley Nixon is a 82 y.o. male with remote history of smoking for a few years but quit 77 years ago. The patient has a past medical history significant for coronary artery disease status post myocardial infarction, atrial fibrillation, dyslipidemia, TIA and hypothyroidism. He has been very active and well And exercises at regular basis until 3 years ago when he had a fall and pelvic fracture. In December 2016 the patient had been cleaning his house with Lysol. A few weeks later he started having running nose as well as cough with blood-tinged sputum. He was seen by Dr. Ernesto Rutherford and chest x-ray on 08/15/2015 showed large right upper lobe lung mass worrisome for neoplasm. This was followed by CT scan of the chest without contrast on 08/21/2015 and it showed a lobulated mass arising in the posterior segment of the right upper lobe measuring 6.5 x 5.4 x 5.2 cm. There are several small nodular opacities in the lungs measuring between 2 and 4 mm. Etiology of the small nodular opacities is uncertain. No demonstrable adenopathy. Dr. Ernesto Rutherford kindly referred the patient to me today for evaluation and recommendation regarding his condition. When seen today the patient is feeling fine with no specific complaints except for mild shortness of breath secondary to nasal congestion addition to cough with occasional blood tinged sputum. He lost around 50 pounds in the 12 months. He denied having any significant headaches but has some visual changes. The patient denied having any significant nausea, vomiting, diarrhea or constipation. Family history significant for a mother and father died from heart disease. The patient is married and has 2 children. He was  accompanied today by his wife Iris and his son Migel. He used to work as a Dance movement psychotherapist. He has a remote history of smoking for a few years but quit 64 years ago. He has no history of alcohol or drug abuse.  HPI  Past Medical History  Diagnosis Date  . Coronary artery disease   . Myocardial infarction ([place])   . Anginal pain   . Hypothyroidism   . Shortness of breath   . Pacemaker 05/02/2007    [employer]  . Pneumonia   . Cancer   . Arthritis   . Complication of anesthesia hard to arouse after anesthesia  . Permanent atrial fibrillation     Past Surgical History  Procedure Laterality Date  . Cardioversion  07/28/2011    Procedure: CARDIOVERSION;  Surgeon: Leonie Man;  Location: [facility];  Service: Cardiovascular;  Laterality: N/A;  . Joint replacement    . Permanent pacemaker insertion  05/02/2007    [facility]  . US echocardiography  09/17/2010    LA moderately dilated,mild TR, EF >55%  . Nm myocar perf wall motion  02/09/2010    no ischemia    Family History  Problem Relation Age of Onset  . Heart failure Mother   . Heart attack Father     Social History Social History  Substance Use Topics  . Smoking status: Never Smoker   . Smokeless tobacco: Not on file  . Alcohol Use: No    Allergies  Allergen Reactions  . Iodinated Diagnostic Agents Shortness Of Breath    SOB NAUSEA AND VOMITING  . Zocor [Simvastatin]     Weakness  Current Outpatient Prescriptions  Medication Sig Dispense Refill  . atorvastatin (LIPITOR) 10 MG tablet TAKE 1 TABLET EVERY EVENING 90 tablet 0  . cyanocobalamin (,VITAMIN B-12,) 1000 MCG/ML injection Inject 1 mL into the skin every 30 (thirty) days.    . finasteride (PROSCAR) 5 MG tablet Take 5 mg by mouth every evening.      . fish oil-omega-3 fatty acids 1000 MG capsule Take 1 g by mouth every evening.      Marland Kitchen levothyroxine (SYNTHROID, LEVOTHROID) 50 MCG tablet Take 1 tablet (50 mcg total) by mouth daily. 30 tablet 0  . Multiple Vitamin  (MULITIVITAMIN WITH MINERALS) TABS Take 1 tablet by mouth every evening.      . Tamsulosin HCl (FLOMAX) 0.4 MG CAPS Take 0.4 mg by mouth every evening.      . traMADol (ULTRAM) 50 MG tablet Take 50 mg by mouth every 8 (eight) hours as needed.    . warfarin (COUMADIN) 5 MG tablet TAKE 1 TO 1 AND 1/2 TABLETS  DAILY OR AS DIRECTED BY COUMADIN CLINIC 135 tablet 1  . DimenhyDRINATE (DRAMAMINE PO) Take 1 tablet by mouth as needed. Reported on 09/03/2015    . docusate sodium 100 MG CAPS Take 100 mg by mouth 2 (two) times daily. (Patient not taking: Reported on 09/03/2015) 20 capsule 0  . Lansoprazole (PREVACID PO) Take by mouth daily. Reported on 09/03/2015    . meclizine (ANTIVERT) 25 MG tablet Reported on 09/03/2015  99   No current facility-administered medications for this visit.    Review of Systems  Constitutional: positive for fatigue and weight loss Eyes: negative Ears, nose, mouth, throat, and face: positive for nasal congestion Respiratory: positive for cough, dyspnea on exertion and hemoptysis Cardiovascular: negative Gastrointestinal: negative Genitourinary:negative Integument/breast: negative Hematologic/lymphatic: negative Musculoskeletal:negative Neurological: negative Behavioral/Psych: negative Endocrine: negative Allergic/Immunologic: negative  Physical Exam  TGG:YIRSW, healthy, no distress, well nourished and well developed SKIN: skin color, texture, turgor are normal, no rashes or significant lesions HEAD: Normocephalic, No masses, lesions, tenderness or abnormalities EYES: normal, PERRLA, Conjunctiva are pink and non-injected EARS: External ears normal OROPHARYNX:no exudate, no erythema and lips, buccal mucosa, and tongue normal  NECK: supple, no adenopathy, no JVD LYMPH:  no palpable lymphadenopathy, no hepatosplenomegaly LUNGS: clear to auscultation , and palpation HEART: regular rate & rhythm, no murmurs and no gallops ABDOMEN:abdomen soft, non-tender, normal bowel  sounds and no masses or organomegaly BACK: Back symmetric, no curvature., No CVA tenderness EXTREMITIES:no joint deformities, effusion, or inflammation, no edema, no skin discoloration  NEURO: alert & oriented x 3 with fluent speech, no focal motor/sensory deficits  PERFORMANCE STATUS: ECOG 1  LABORATORY DATA: Lab Results  Component Value Date   WBC 6.7 09/03/2015   HGB 12.5* 09/03/2015   HCT 38.0* 09/03/2015   MCV 92.6 09/03/2015   PLT 198 09/03/2015      Chemistry      Component Value Date/Time   NA 139 09/03/2015 1151   NA 140 06/24/2012 0500   NA 144 04/23/2012 1324   K 4.4 09/03/2015 1151   K 4.2 06/24/2012 0500   K 4.4 04/23/2012 1324   CL 105 06/24/2012 0500   CL 108* 04/23/2012 1324   CO2 23 09/03/2015 1151   CO2 26 06/24/2012 0500   CO2 31 04/23/2012 1324   BUN 19.4 09/03/2015 1151   BUN 14 06/24/2012 0500   BUN 18 04/23/2012 1324   CREATININE 0.8 09/03/2015 1151   CREATININE 0.80 06/24/2012 0500   CREATININE 1.03  04/23/2012 1324      Component Value Date/Time   CALCIUM 8.8 09/03/2015 1151   CALCIUM 8.5 06/24/2012 0500   CALCIUM 8.4* 04/23/2012 1324   ALKPHOS 68 09/03/2015 1151   ALKPHOS 47 11/18/2007 0840   AST 21 09/03/2015 1151   AST 21 11/18/2007 0840   ALT 26 09/03/2015 1151   ALT 19 11/18/2007 0840   BILITOT 0.58 09/03/2015 1151   BILITOT 0.4 11/18/2007 0840       RADIOGRAPHIC STUDIES: Dg Chest 2 View  08/18/2015  CLINICAL DATA:  Hemoptysis for 1 month. EXAM: CHEST  2 VIEW COMPARISON:  06/23/2012 FINDINGS: The cardiac silhouette, mediastinal and hilar contours are within normal limits and stable. The pacer wires are stable. There is mild tortuosity and calcification of the thoracic aorta. The lungs are clear of acute process. No infiltrates or effusions. There is a the 4.7 x 7.0 x 6.0 cm right upper lobe lung mass worrisome for neoplasm. Recommend chest CT with contrast for further evaluation. The bony structures are intact. Moderate degenerative  changes involving the thoracic spine. IMPRESSION: Large right upper lobe lung mass worrisome for neoplasm. Recommend chest CT with contrast for further evaluation. These results will be called to the ordering clinician or representative by the Radiologist Assistant, and communication documented in the PACS or zVision Dashboard. Electronically Signed   By: Marijo Sanes M.D.   On: 08/18/2015 11:50   Ct Chest Wo Contrast  08/21/2015  CLINICAL DATA:  Right upper lobe mass EXAM: CT CHEST WITHOUT CONTRAST TECHNIQUE: Multidetector CT imaging of the chest was performed following the standard protocol without IV contrast. COMPARISON:  Chest radiograph August 18, 2015. FINDINGS: Mediastinum/Lymph Nodes: Thyroid appears normal. There is no demonstrable thoracic adenopathy by size criteria. There are scattered tiny lymph nodes in the mediastinum which do not meet size criteria for pathologic significance. There is atherosclerotic change in the aorta but no demonstrable aneurysm. The visualized great vessels appear unremarkable on this noncontrast enhanced study. There is a small pericardial effusion. Pacemaker leads are attached to the right atrium and right ventricle. There are multiple foci of coronary artery calcification. Lungs/Pleura: There is a lobulated mass arising in the posterior segment of the right upper lobe measuring 6.5 x 5.4 x 5.2 cm. There is mild surrounding pneumonitis in this immediate area. On axial slice 39 series 5, there is a 2 mm nodular opacity in the medial segment right middle lobe. On axial slice 47 series 5, there is a 2 mm nodular opacity abutting the pleura in the lateral segment right middle lobe. On axial slice 55, there is a 4 mm nodular opacity abutting the pleura in the posterior segment of the right lower lobe. On axial slice 36 series 5, there is a 2 mm nodular opacity in the superior segment right middle lobe. On axial slice 52 series 5, there is a 3 mm nodular opacity in the lateral  segment of the right lower lobe. There is interstitial fibrotic change in the inferior most aspect of the right middle lobe as well as in the lateral and posterior segments of the left lower lobe inferiorly. There is mild lower lobe bronchiectatic change bilaterally. There is no appreciable pleural thickening. Upper abdomen: In the visualized upper abdomen, there is an apparent cyst in the dome of the liver on the right anteriorly measuring 1.4 x 1.0 cm. There are nearby subcentimeter nodular areas which may represent cysts or hamartomas. No other liver lesions are identified in the visualized portions of  the liver. There are is mild adrenal hypertrophy bilaterally without well-defined adrenal lesions on either side. There is a 1 mm calculus in the mid left kidney. There is a cyst arising from the anterior mid left kidney measuring 1.7 x 1.5 cm. There is atherosclerotic calcification in the visualized upper abdominal aorta. Musculoskeletal: There is degenerative change in the thoracic spine. There is degenerative change in each shoulder. There are no blastic or lytic bone lesions. IMPRESSION: Dominant mass arising from the posterior segment right upper lobe measuring 6.5 x 5.4 x 5.2 cm. Neoplasm suspected. There are several small nodular opacities in the lungs measuring between 2 and 4 mm. Etiology of the small nodular opacities is uncertain. Fibrotic change in the lung bases. Mild lower lobe bronchiectatic change bilaterally. No demonstrable adenopathy. Cyst in the right lobe of the liver near the dome. Subcentimeter small nodular opacities in the liver are of uncertain etiology. Adrenal hypertrophy bilaterally without well-defined adrenal mass. Small pericardial effusion. Scattered foci of coronary artery calcification. These results will be called to the ordering clinician or representative by the Radiologist Assistant, and communication documented in the PACS or zVision Dashboard. Electronically Signed   By:  Lowella Grip III M.D.   On: 08/21/2015 13:55    ASSESSMENT: This is a very pleasant 82 years old white male was questionably stage IIA (T2b, N0, M0) non-small cell lung cancer presented with large right upper lobe lung mass, pending tissue diagnosis and the staging workup   PLAN: I had a lengthy discussion with the patient and his family today about his current disease stage, prognosis and treatment options. I recommended for the patient to complete the staging workup by ordering a PET scan in addition to MRI of the brain. After the PET scan, we will decide on the best option to have a tissue diagnosis with her so bronchoscopy with endobronchial ultrasound and biopsies for CT-guided biopsy of the right upper lobe lung mass. The patient would come back for follow-up visit in 2 weeks for reevaluation and more detailed discussion of his treatment options based on the final staging workup. He was seen during the multidisciplinary thoracic oncology clinic today by medical oncology, thoracic navigator, social worker and physical therapist. The patient was advised to call immediately if he has any concerning symptoms in the interval.  The patient voices understanding of current disease status and treatment options and is in agreement with the current care plan.  All questions were answered. The patient knows to call the clinic with any problems, questions or concerns. We can certainly see the patient much sooner if necessary.  Thank you so much for allowing me to participate in the care of Bradley Nixon. I will continue to follow up the patient with you and assist in his care.  I spent 40 minutes counseling the patient face to face. The total time spent in the appointment was 60 minutes.  Disclaimer: This note was dictated with voice recognition software. Similar sounding words can inadvertently be transcribed and may not be corrected upon review.   Nylan Nakatani K. September 03, 2015, 1:56  PM

## 2015-09-03 NOTE — Telephone Encounter (Signed)
per pof to sch pt appt-gave pt copy of avs-per [facility] for PET/MRI-scheduled for pt

## 2015-09-03 NOTE — Progress Notes (Signed)
Oncology Nurse Navigator Documentation  Oncology Nurse Navigator Flowsheets 09/03/2015  Navigator Location [facility]/spoke with patient and wife today.  I gave and explained information on cancer center and next steps.  Patient will be getting PET scan then will biopsy  I notified percert team of needed scans and biopsy to help expedite   Navigator Encounter Type Clinic/[facility]  Treatment Phase Abnormal Scans  Barriers/Navigation Needs Education  Education Other  Interventions Education Method  Education Method Verbal;Written  Acuity Level 2  Time Spent with Patient 30

## 2015-09-03 NOTE — Therapy (Signed)
[place], [place], [address] Phone: [phone]   Fax:  [phone]  Physical Therapy Evaluation  Patient Details  Name: Bradley Nixon MRN: [id number] Date of Birth: 01-Sep-1920 Referring Provider: Dr. Curt Bears  Encounter Date: 09/03/2015      PT End of Session - 09/03/15 1444    Visit Number 1   Number of Visits 1   PT Start Time 6834   PT Stop Time 1425   PT Time Calculation (min) 30 min   Activity Tolerance Patient tolerated treatment well   Behavior During Therapy [facility] for tasks assessed/performed      Past Medical History  Diagnosis Date  . Coronary artery disease   . Myocardial infarction ([place])   . Anginal pain   . Hypothyroidism   . Shortness of breath   . Pacemaker 05/02/2007    [employer]  . Pneumonia   . Cancer   . Arthritis   . Complication of anesthesia hard to arouse after anesthesia  . Permanent atrial fibrillation     Past Surgical History  Procedure Laterality Date  . Cardioversion  07/28/2011    Procedure: CARDIOVERSION;  Surgeon: Leonie Man;  Location: [facility] OR;  Service: Cardiovascular;  Laterality: N/A;  . Joint replacement    . Permanent pacemaker insertion  05/02/2007    [facility]  . US echocardiography  09/17/2010    LA moderately dilated,mild TR, EF >55%  . Nm myocar perf wall motion  02/09/2010    no ischemia    There were no vitals filed for this visit.  Visit Diagnosis:  Gait instability - Plan: PT plan of care cert/re-cert  Right-sided low back pain without sciatica - Plan: PT plan of care cert/re-cert  Stiffness of multiple joints - Plan: PT plan of care cert/re-cert  Personal history of fall - Plan: PT plan of care cert/re-cert      Subjective Assessment - 09/03/15 1429    Subjective Talks frequently about his back pain from old injury.   Patient is accompained by: Family member  wife and son   Pertinent History Pt. presented with hemoptysis x 1  month, and workup showed right lung mass.  Mass is 6.5 cm. in diameter and he also has right upper lobe hilar fullness.  Pt. is to have biopsy, PET, and MRI to determine diagnosis.  Pt. was never smoker.  h/o MI, pacemaker.  h/o fracture of inferior and superior pubic ramus plus sacrum some 3  years ago and mobility was very limited x 3-4 months and is still affected.  h/o rotator cuff injury both sides from falls.     Patient Stated Goals get info from all lung clinic providers   Currently in Pain? Yes   Pain Score 6    Pain Location Back  also both knees   Pain Orientation Right;Lower   Pain Descriptors / Indicators Sharp   Pain Type Chronic pain   Pain Onset More than a month ago   Aggravating Factors  trying to garden   Pain Relieving Factors pain meds            [facility] PT Assessment - 09/03/15 0001    Assessment   Medical Diagnosis right upper lobe lung mass; no tissue diagnosis yet   Referring Provider Dr. Curt Bears   Onset Date/Surgical Date 06/25/15  approx.   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  Yes   Is the patient reluctant to leave their home because of a fear of falling?  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 negotiate steps   Prior Function   Level of Independence Independent with household mobility with device;Independent with basic ADLs   Leisure  mows lawn on tractor, does woodworking, rakes leaves, trims bushes   Cognition   Overall Cognitive Status Within Functional Limits for tasks assessed  perhaps slight confusion in telling his story   Observation/Other Assessments   Observations 82year-old male who looks younger, sitting in a wheelchair and accompanied by his wife of 44 years who is also in a wheelchair today   Functional Tests   Functional tests Sit to Stand   Sit to Stand   Comments 7 times in 30 seconds   Posture/Postural Control   Posture/Postural Control Postural limitations   Postural Limitations Flexed trunk  slight   ROM / Strength   AROM / PROM / Strength AROM   AROM   Overall AROM Comments trunk AROM in standing:  flexion not tested; extension and sidebend bilat. limited 25%; pt. says he is unable to rotate due to back injury   Ambulation/Gait   Ambulation/Gait Yes   Ambulation/Gait Assistance 6: Modified independent (Device/Increase time)   Ambulation Distance (Feet) 10 Feet  today, in exam room   Assistive device Standard walker   Gait Pattern --  without device, needs something to hold to, shuffles, slow   Balance   Balance Assessed Yes   Dynamic Standing Balance   Dynamic Standing - Comments reaches 6 inches forward in standing                           PT Education - 09/03/15 1443    Education provided Yes   Education Details posture, breathing, energy conservation, walking or other exercise such as arm bike, staying active through treatment, cough splinting, PT info   Person(s) Educated Patient;Spouse;Child(ren)   Methods Explanation;Handout   Comprehension Verbalized understanding               Lung Clinic Goals - 09/03/15 1449    Patient will be able to verbalize understanding of the benefit of exercise to decrease fatigue.   Status Achieved   Patient will be able to verbalize the importance of posture.   Status Achieved   Patient will be able to demonstrate  diaphragmatic breathing for improved lung function.   Status Achieved   Patient will be able to verbalize understanding of the role of physical therapy to prevent functional decline and who to contact if physical therapy is needed.   Status Achieved             Plan - 09/03/15 1444    Clinical Impression Statement Elderly man with limited mobility and back pain from old injuries, with h/o multiple falls including  pelvic fracture, now with lung mass.     Pt will benefit from skilled therapeutic intervention in order to improve on the following deficits Abnormal gait;Postural dysfunction;Pain;Decreased balance;Decreased range of motion   Rehab Potential Fair   PT Frequency One time visit   PT Treatment/Interventions Patient/family education   PT Next Visit Plan None at this time; patient may benefit from therapy later for low back pain, impaired mobility, decreased ROM.   PT Home Exercise Plan see education section   Consulted and Agree with Plan of Care Patient          G-Codes - 09-04-2015 1449    Functional Limitation Mobility: Walking and moving around   Mobility: Walking and Moving Around Current Status [phone]) At least 60 percent but less than 80 percent impaired, limited or restricted   Mobility: Walking and Moving Around Goal Status [phone]) At least 60 percent but less than 80 percent impaired, limited or restricted   Mobility: Walking and Moving Around Discharge Status [phone]) At least 60 percent but less than 80 percent impaired, limited or restricted       Problem List Patient Active Problem List   Diagnosis Date Noted  . Lung mass 08/24/2015  . Dyspnea 10/18/2013  . Hyperlipidemia 04/17/2013  . Atrial fibrillation ([place]) 10/10/2012  . Long term current use of anticoagulant therapy 10/10/2012  . Fracture of inferior pubic ramus ([place]) 06/24/2012  . Fracture of superior pubic ramus ([place]) 06/24/2012  . Sacral fracture ([place]) 06/24/2012  . Fall 06/24/2012  .  Hypothyroidism   . Pacemaker   . Myocardial infarction [facility])     [place] 2015-09-04, 2:51 PM  [place], [place], [address] Phone: [phone]   Fax:  [phone]  Name: Bradley Nixon MRN: [id number] Date of Birth: 04/01/1921   Serafina Royals, PT 2015/09/04 2:51 PM

## 2015-09-05 ENCOUNTER — Encounter: Payer: Self-pay | Admitting: Internal Medicine

## 2015-09-11 ENCOUNTER — Encounter (HOSPITAL_COMMUNITY)
Admission: RE | Admit: 2015-09-11 | Discharge: 2015-09-11 | Disposition: A | Discharge status: Home / Self Care | Payer: Medicare Other | Source: Ambulatory Visit | Attending: Internal Medicine | Admitting: Internal Medicine

## 2015-09-11 DIAGNOSIS — R918 Other nonspecific abnormal finding of lung field: Secondary | ICD-10-CM | POA: Insufficient documentation

## 2015-09-11 LAB — GLUCOSE, CAPILLARY: Glucose-Capillary: 103 mg/dL — ABNORMAL HIGH (ref 65–99)

## 2015-09-11 MED ORDER — FLUDEOXYGLUCOSE F - 18 (FDG) INJECTION
7.5800 | Freq: Once | INTRAVENOUS | Status: AC | PRN
  Administered 2015-09-11: 7.58 via INTRAVENOUS

## 2015-09-14 ENCOUNTER — Telehealth: Payer: Self-pay | Admitting: Medical Oncology

## 2015-09-14 ENCOUNTER — Telehealth: Payer: Self-pay | Admitting: *Deleted

## 2015-09-14 ENCOUNTER — Telehealth: Payer: Self-pay | Admitting: Internal Medicine

## 2015-09-14 ENCOUNTER — Other Ambulatory Visit: Payer: Self-pay | Admitting: Medical Oncology

## 2015-09-14 ENCOUNTER — Other Ambulatory Visit: Payer: Self-pay | Admitting: Internal Medicine

## 2015-09-14 DIAGNOSIS — R918 Other nonspecific abnormal finding of lung field: Secondary | ICD-10-CM

## 2015-09-14 NOTE — Telephone Encounter (Signed)
Spoke with pt and his wife to inform him of the MRI change from 2/21 to 2/23 at [facility]. Told pt we will call them back with an appt date/time for ov with Dr. Earlie Server to review scan results. Left message with nurse to advise

## 2015-09-14 NOTE — Telephone Encounter (Signed)
Oncology Nurse Navigator Documentation  Oncology Nurse Navigator Flowsheets 09/14/2015  Navigator Encounter Type Telephone/I spoke with Dr. Julien Nordmann about recent PET scan.  He would like patient to be set up to be evaluated by thoracic surgeon.  I called and spoke with patient's wife.  I gave her appt for [place] on 09/17/15 arrive at 3:45.  She verbalized understanding of appt time and place.   Telephone Outgoing Call  Treatment Phase Abnormal Scans  Barriers/Navigation Needs Coordination of Care  Interventions Coordination of Care  Coordination of Care Appts  Acuity Level 1  Time Spent with Patient 15

## 2015-09-14 NOTE — Telephone Encounter (Signed)
'  Pl ease , i need an open mri , I cannot be closed up . Please help me". I told pt that MRI will be reordered to be open and someone will call him with appt.

## 2015-09-14 NOTE — Telephone Encounter (Signed)
Pt notified that he cannot get an open MRI because of his pacemaker and I  told him Julien Nordmann will order CT for brain and to expect a call from scheduling

## 2015-09-14 NOTE — Telephone Encounter (Signed)
Pt unable to get open MRI because he has a pacemaker. Note to [place].

## 2015-09-15 ENCOUNTER — Ambulatory Visit (HOSPITAL_COMMUNITY): Admission: RE | Admit: 2015-09-15 | Payer: Medicare Other | Source: Ambulatory Visit

## 2015-09-15 ENCOUNTER — Telehealth: Payer: Self-pay | Admitting: Medical Oncology

## 2015-09-15 ENCOUNTER — Other Ambulatory Visit: Payer: Self-pay | Admitting: Medical Oncology

## 2015-09-15 NOTE — Telephone Encounter (Signed)
Pt.notified

## 2015-09-15 NOTE — Telephone Encounter (Signed)
-----   Message from Curt Bears, MD sent at 09/14/2015  8:48 PM EST ----- Regarding: RE: need ct order without Will hold on ordering brain imaging until i see him again, ----- Message -----    From: Ardeen Garland, RN    Sent: 09/14/2015   3:02 PM      To: Curt Bears, MD Subject: need ct order without                          Cannot get open MRI with pacemaker. Please order ct brain -pt allergic to iodineated contrast agents

## 2015-09-16 ENCOUNTER — Encounter: Payer: Medicare Other | Admitting: Pharmacist Clinician (PhC)/ Clinical Pharmacy Specialist

## 2015-09-16 ENCOUNTER — Telehealth: Payer: Self-pay | Admitting: *Deleted

## 2015-09-16 NOTE — Telephone Encounter (Signed)
Oncology Nurse Navigator Documentation  Oncology Nurse Navigator Flowsheets 09/16/2015  Navigator Encounter Type Telephone/I noticed patient was scheduled to see Dr. Julien Nordmann tomorrow at 10:00.  I updated Dr. Julien Nordmann patient was to be seen with Dr. Servando Snare tomorrow..  Dr. Julien Nordmann asked that I cancel the appt with him at 10:00 am.  I called and spoke with patient.  He verbalized understanding that Dr. Worthy Flank appt was cancelled and he still comes to see Dr. Servando Snare arrive at 3:45.  Telephone Outgoing Call  Treatment Phase Abnormal Scans  Barriers/Navigation Needs Coordination of Care  Interventions Coordination of Care  Coordination of Care Appts  Education Method -  Acuity Level 1  Time Spent with Patient 15

## 2015-09-16 NOTE — Telephone Encounter (Signed)
Called and confirmed 09/17/15 clinic appt w/ pt.

## 2015-09-17 ENCOUNTER — Telehealth: Payer: Self-pay | Admitting: *Deleted

## 2015-09-17 ENCOUNTER — Ambulatory Visit: Payer: Medicare Other | Admitting: Internal Medicine

## 2015-09-17 ENCOUNTER — Encounter: Payer: Self-pay | Admitting: *Deleted

## 2015-09-17 ENCOUNTER — Other Ambulatory Visit: Payer: Medicare Other

## 2015-09-17 ENCOUNTER — Encounter: Payer: Medicare Other | Admitting: Cardiothoracic Surgery

## 2015-09-17 DIAGNOSIS — R918 Other nonspecific abnormal finding of lung field: Secondary | ICD-10-CM

## 2015-09-17 NOTE — Telephone Encounter (Signed)
Oncology Nurse Navigator Documentation  Oncology Nurse Navigator Flowsheets 09/17/2015  Navigator Location -  Navigator Encounter Type Telephone/Bradley Nixon called cancer center and was confused about up coming appt.  I called him back to update.  He is unable to get an MRI Brain due to pacemaker.  Dr. Julien Nordmann aware and request CT Head.  I will order and place POF  Telephone Outgoing Call  Treatment Phase Abnormal Scans  Barriers/Navigation Needs Coordination of Care  Education -  Interventions Coordination of Care  Coordination of Care Radiology  Education Method -  Acuity Level 1  Time Spent with Patient 15

## 2015-09-18 ENCOUNTER — Encounter: Payer: Self-pay | Admitting: *Deleted

## 2015-09-18 DIAGNOSIS — R918 Other nonspecific abnormal finding of lung field: Secondary | ICD-10-CM

## 2015-09-22 ENCOUNTER — Ambulatory Visit (HOSPITAL_COMMUNITY)
Admission: RE | Admit: 2015-09-22 | Discharge: 2015-09-22 | Disposition: A | Discharge status: Home / Self Care | Payer: Medicare Other | Source: Ambulatory Visit | Attending: Internal Medicine | Admitting: Internal Medicine

## 2015-09-22 DIAGNOSIS — G319 Degenerative disease of nervous system, unspecified: Secondary | ICD-10-CM | POA: Insufficient documentation

## 2015-09-22 DIAGNOSIS — R9082 White matter disease, unspecified: Secondary | ICD-10-CM | POA: Insufficient documentation

## 2015-09-22 DIAGNOSIS — C3490 Malignant neoplasm of unspecified part of unspecified bronchus or lung: Secondary | ICD-10-CM | POA: Insufficient documentation

## 2015-09-22 DIAGNOSIS — R918 Other nonspecific abnormal finding of lung field: Secondary | ICD-10-CM

## 2015-09-22 DIAGNOSIS — Z8673 Personal history of transient ischemic attack (TIA), and cerebral infarction without residual deficits: Secondary | ICD-10-CM | POA: Insufficient documentation

## 2015-09-25 ENCOUNTER — Telehealth: Payer: Self-pay | Admitting: *Deleted

## 2015-09-25 ENCOUNTER — Encounter: Payer: Self-pay | Admitting: *Deleted

## 2015-09-25 DIAGNOSIS — R918 Other nonspecific abnormal finding of lung field: Secondary | ICD-10-CM

## 2015-09-25 NOTE — Progress Notes (Signed)
[facility] Psychosocial Distress Screening Clinical Social Work  Clinical Social Work was referred by distress screening protocol.  The patient scored a 5 on the Psychosocial Distress Thermometer which indicates moderate distress. Clinical Social Worker contacted patient by phone to assess for distress and other psychosocial needs.  Bradley Nixon reported he has completed many tests and is scheduled to meet with Dr. Servando Snare next week.  CSW confirmed the time and location for patient.  Bradley Nixon shared he has been anxious about dealing with his lung cancer- he reported his wife has high blood pressure and whenever they discuss his medical status, her blood pressure elevates.  CSW provided brief emotional support and suggested CSW serve as a resource to process feelings surrounding cancer diagnosis. The patient has a daughter that lives in [place] and a son that lives in [place].  He reported strong support from family, neighbor, and church.  Patient indicated transportation is occasionally a problem because he does not drive.  CSW provided patient with information on Investment banker, operational through [employer] [place].   Patient was very appreciative of call and plans to contact CSW as needed.   ONCBCN DISTRESS SCREENING 09/03/2015  Distress experienced in past week (1-10) 5  Practical problem type Transportation  Emotional problem type Nervousness/Anxiety  Physical Problem type Pain;Getting around  Referral to clinical social work Yes    Bradley Nixon, MSW, LCSW, OSW-C Clinical Social Worker [facility] [phone]

## 2015-09-25 NOTE — Telephone Encounter (Signed)
Oncology Nurse Navigator Documentation  Oncology Nurse Navigator Flowsheets 09/25/2015  Navigator Encounter Type Telephone/I noticed that PFT's have not been scheduled.  I placed POF on 09/17/15 and ordered PFT's per Dr. Julien Nordmann.  I called pulmonary care to schedule.  I called and left Mr. Huskins a vm message regarding appt for PFT's time and location and pre-procedure instructions.   Telephone Outgoing Call  Treatment Phase Abnormal Scans  Barriers/Navigation Needs Coordination of Care  Interventions Coordination of Care  Coordination of Care Other  Education Method -  Acuity Level 2  Time Spent with Patient 30

## 2015-09-28 ENCOUNTER — Ambulatory Visit (HOSPITAL_COMMUNITY)
Admission: RE | Admit: 2015-09-28 | Discharge: 2015-09-28 | Disposition: A | Discharge status: Home / Self Care | Payer: Medicare Other | Source: Ambulatory Visit | Attending: Internal Medicine | Admitting: Internal Medicine

## 2015-09-28 ENCOUNTER — Encounter: Payer: Self-pay | Admitting: *Deleted

## 2015-09-28 DIAGNOSIS — R918 Other nonspecific abnormal finding of lung field: Secondary | ICD-10-CM

## 2015-09-28 LAB — PULMONARY FUNCTION TEST
DL/VA % pred: 84 %
DL/VA: 3.69 ml/min/mmHg/L
DLCO UNC % PRED: 90 %
DLCO UNC: 25.58 ml/min/mmHg
FEF 25-75 PRE: 2.55 L/s
FEF 25-75 Post: 1.66 L/sec
FEF2575-%Change-Post: -34 %
FEF2575-%PRED-POST: 155 %
FEF2575-%PRED-PRE: 238 %
FEV1-%Change-Post: -25 %
FEV1-%PRED-POST: 112 %
FEV1-%Pred-Pre: 151 %
FEV1-POST: 2.19 L
FEV1-Pre: 2.95 L
FEV1FVC-%CHANGE-POST: -33 %
FEV1FVC-%PRED-PRE: 106 %
FEV6-%CHANGE-POST: 9 %
FEV6-%PRED-POST: 164 %
FEV6-%Pred-Pre: 149 %
FEV6-PRE: 4.01 L
FEV6-Post: 4.4 L
FEV6FVC-%CHANGE-POST: -1 %
FEV6FVC-%PRED-PRE: 109 %
FEV6FVC-%Pred-Post: 108 %
FVC-%Change-Post: 11 %
FVC-%PRED-POST: 150 %
FVC-%Pred-Pre: 135 %
FVC-Post: 4.46 L
FVC-Pre: 4.01 L
POST FEV1/FVC RATIO: 49 %
PRE FEV1/FVC RATIO: 74 %
Post FEV6/FVC ratio: 99 %
Pre FEV6/FVC Ratio: 100 %
RV % PRED: 107 %
RV: 2.95 L
TLC % pred: 108 %
TLC: 7.03 L

## 2015-09-28 MED ORDER — ALBUTEROL SULFATE (2.5 MG/3ML) 0.083% IN NEBU
2.5000 mg | INHALATION_SOLUTION | Freq: Once | RESPIRATORY_TRACT | Status: AC
  Administered 2015-09-28: 2.5 mg via RESPIRATORY_TRACT

## 2015-09-28 NOTE — Progress Notes (Signed)
Oncology Nurse Navigator Documentation  Oncology Nurse Navigator Flowsheets 09/28/2015  Navigator Encounter Type Telephone/Mr. Weigold called.  He was confused about appt for PFT's. I clarified for him.  He was thankful for the help.   Telephone Incoming Call  Treatment Phase Abnormal Scans  Barriers/Navigation Needs Coordination of Care  Coordination of Care Appts;Radiology  Acuity Level 1  Time Spent with Patient 15

## 2015-09-29 ENCOUNTER — Other Ambulatory Visit: Payer: Self-pay | Admitting: *Deleted

## 2015-09-29 ENCOUNTER — Institutional Professional Consult (permissible substitution) (INDEPENDENT_AMBULATORY_CARE_PROVIDER_SITE_OTHER): Payer: Medicare Other | Admitting: Cardiothoracic Surgery

## 2015-09-29 ENCOUNTER — Encounter: Payer: Self-pay | Admitting: Cardiothoracic Surgery

## 2015-09-29 VITALS — BP 126/75 | HR 96 | Resp 20 | Ht 68.0 in | Wt 150.0 lb

## 2015-09-29 DIAGNOSIS — R918 Other nonspecific abnormal finding of lung field: Secondary | ICD-10-CM

## 2015-09-29 DIAGNOSIS — R911 Solitary pulmonary nodule: Secondary | ICD-10-CM

## 2015-09-29 NOTE — Progress Notes (Signed)
[address]       [place] [address]             [phone]                    Cesareo W Nevins Alder Medical Record #[id number] Date of Birth: March 26, 1921  Referring: Curt Bears, MD Primary Care: Myriam Jacobson, MD  Chief Complaint:    Chief Complaint  Patient presents with  . Lung Mass    Surgical eval, PFT's 09/28/15, PET Scan 09/11/15, Head CT 09/22/15    History of Present Illness:    Bradley Nixon 82 y.o. male is seen in the office  today for right upper lobe lung mass.  . The patient has a past medical history significant for coronary artery disease status post myocardial infarction, atrial fibrillation, dyslipidemia, TIA and hypothyroidism. He has been  active and  Exercised at regular basis until 3 years ago when he had a fall and pelvic fracture. In December 2016 the patient had been cleaning his house with Lysol. A few weeks later he started having running nose as well as cough with blood-tinged sputum. He was seen by Dr. Ernesto Rutherford and chest x-ray on 08/15/2015 showed large right upper lobe lung mass worrisome for neoplasm. This was followed by CT scan of the chest without contrast on 08/21/2015 and it showed a lobulated mass arising in the posterior segment of the right upper lobe measuring 6.5 x 5.4 x 5.2 cm. There are several small nodular opacities in the lungs measuring between 2 and 4 mm. Etiology of the small nodular opacities is uncertain. No demonstrable adenopathy.   He lost around 50 pounds in the 12 months mostly since he fell and broke pelvis . He denied having any significant headaches but has some visual changes. The patient denied having any significant nausea, vomiting, diarrhea or constipation. Family history significant for a mother and father died from heart disease. The patient is married and has 2 children. He was accompanied today by his wife Iris and his son Shontez and daughter in Sports coach , wife is in wheel chair and patient  walking with walker with help. He used to work as a Dance movement psychotherapist. He has a remote history of smoking for a few years but quit 64 years ago. He has no history of alcohol or drug abuse.      Current Activity/ Functional Status:  Patient is not independent with mobility/ambulation, transfers, ADL's, IADL's.  Needs help getting around, but does help his wife who is more incapacitate with recent fall unable to go to grocery store    Zubrod Score: At the time of surgery this patient's most appropriate activity status/level should be described as: '[]'$     0    Normal activity, no symptoms '[]'$     1    Restricted in physical strenuous activity but ambulatory, able to do out light work '[x]'$     2    Ambulatory and capable of self care, unable to do work activities, up and about               >50 % of waking hours                              '[]'$     3    Only limited self care, in bed greater than 50% of waking hours '[]'$   4    Completely disabled, no self care, confined to bed or chair '[]'$     5    Moribund   Past Medical History  Diagnosis Date  . Coronary artery disease   . Myocardial infarction ([place])   . Anginal pain ([place])   . Hypothyroidism   . Shortness of breath   . Pacemaker 05/02/2007    [facility]  . Pneumonia   . Cancer ([place])   . Arthritis   . Complication of anesthesia hard to arouse after anesthesia  . Permanent atrial fibrillation ([place])    MI in 1964  Past Surgical History  Procedure Laterality Date  . Cardioversion  07/28/2011    Procedure: CARDIOVERSION;  Surgeon: Leonie Man;  Location: [facility] OR;  Service: Cardiovascular;  Laterality: N/A;  . Joint replacement    . Permanent pacemaker insertion  05/02/2007    [facility]  . US echocardiography  09/17/2010    LA moderately dilated,mild TR, EF >55%  . Nm myocar perf wall motion  02/09/2010    no ischemia    Family History  Problem Relation Age of Onset  . Heart failure Mother   . Heart attack Father     Social History    Social History  . Marital Status: Married    Spouse Name: N/A  . Number of Children: N/A  . Years of Education: N/A   Occupational History  . Not on file.   Social History Main Topics  . Smoking status: Never Smoker   . Smokeless tobacco: Not on file  . Alcohol Use: No  . Drug Use: No  . Sexual Activity: Not Currently   Other Topics Concern  . Not on file   Social History Narrative    History  Smoking status  . Never Smoker   Smokeless tobacco  . Not on file    History  Alcohol Use No     Allergies  Allergen Reactions  . Iodinated Diagnostic Agents Shortness Of Breath    SOB NAUSEA AND VOMITING  . Zocor [Simvastatin]     Weakness     Current Outpatient Prescriptions  Medication Sig Dispense Refill  . atorvastatin (LIPITOR) 10 MG tablet TAKE 1 TABLET EVERY EVENING 90 tablet 0  . cyanocobalamin (,VITAMIN B-12,) 1000 MCG/ML injection Inject 1 mL into the skin every 30 (thirty) days.    . DimenhyDRINATE (DRAMAMINE PO) Take 1 tablet by mouth as needed. Reported on 09/03/2015    . docusate sodium 100 MG CAPS Take 100 mg by mouth 2 (two) times daily. (Patient not taking: Reported on 09/03/2015) 20 capsule 0  . finasteride (PROSCAR) 5 MG tablet Take 5 mg by mouth every evening.      . fish oil-omega-3 fatty acids 1000 MG capsule Take 1 g by mouth every evening.      . Lansoprazole (PREVACID PO) Take by mouth daily. Reported on 09/03/2015    . levothyroxine (SYNTHROID, LEVOTHROID) 50 MCG tablet Take 1 tablet (50 mcg total) by mouth daily. 30 tablet 0  . meclizine (ANTIVERT) 25 MG tablet Reported on 09/03/2015  99  . Multiple Vitamin (MULITIVITAMIN WITH MINERALS) TABS Take 1 tablet by mouth every evening.      . Tamsulosin HCl (FLOMAX) 0.4 MG CAPS Take 0.4 mg by mouth every evening.      . traMADol (ULTRAM) 50 MG tablet Take 50 mg by mouth every 8 (eight) hours as needed.    . warfarin (COUMADIN) 5 MG tablet TAKE 1 TO 1 AND  1/2 TABLETS  DAILY OR AS DIRECTED BY COUMADIN  CLINIC 135 tablet 1   No current facility-administered medications for this visit.      Review of Systems:     Cardiac Review of Systems: Y or N  Chest Pain [   n ]  Resting SOB [ n  ] Exertional SOB  Blue.Reese ]  Orthopnea [ n ]   Pedal Edema [ n  ]    Palpitations [ y ] Syncope  [n  ]   Presyncope [n   ]  General Review of Systems: [Y] = yes [  ]=no Constitional: recent weight change [ 25 lbs since fell 3 years ago and broke pelvis  ];  Wt loss over the last 3 months [   ] anorexia [  ]; fatigue [  ]; nausea [  ]; night sweats [  ]; fever [  ]; or chills [  ];          Dental: poor dentition[  ]; Last Dentist visit:   Eye : blurred vision [  ]; diplopia [   ]; vision changes [  ];  Amaurosis fugax[  ]; Resp: cough [  ];  wheezing[  ];  hemoptysis[  ]; shortness of breath[  ]; paroxysmal nocturnal dyspnea[  ]; dyspnea on exertion[ y ]; or orthopnea[  ];  GI:  gallstones[  ], vomiting[  ];  dysphagia[  ]; melena[  ];  hematochezia [  ]; heartburn[  ];   Hx of  Colonoscopy[  ]; GU: kidney stones [  ]; hematuria[  ];   dysuria [  ];  nocturia[  ];  history of     obstruction [  ]; urinary frequency [  ]             Skin: rash, swelling[  ];, hair loss[  ];  peripheral edema[  ];  or itching[  ]; Musculosketetal: myalgias[  ];  joint swelling[  ];  joint erythema[  ];  joint pain[  ];  back pain[  ];  Heme/Lymph: bruising[  ];  bleeding[  ];  anemia[  ];  Neuro: TIA[  ];  headaches[  ];  stroke[ y trouble with speach ];  vertigo[  ];  seizures[  ];   paresthesias[  ];  difficulty walking[ y uses walker  ];  Psych:depression[ n ]; anxiety[  ];  Endocrine: diabetes[ n ];  thyroid dysfunction[  ];  Immunizations: Flu up to date [  ]; Pneumococcal up to date [  ];  Other:  Physical Exam: Ht '5\' 8"'$  (1.727 m)  SpO2   PHYSICAL EXAMINATION: General appearance: alert, cooperative, appears stated age and no distress Head: Normocephalic, without obvious abnormality, atraumatic Neck: no adenopathy, no  carotid bruit, no JVD, supple, symmetrical, trachea midline and thyroid not enlarged, symmetric, no tenderness/mass/nodules Lymph nodes: Cervical, supraclavicular, and axillary nodes normal. Resp: diminished breath sounds bibasilar Back: symmetric, no curvature. ROM normal. No CVA tenderness. Cardio: irregularly irregular rhythm GI: soft, non-tender; bowel sounds normal; no masses,  no organomegaly Extremities: extremities normal, atraumatic, no cyanosis or edema Neurologic: Grossly normal but slow with ambulation using walker, would be unable to do 6 min walk test   Diagnostic Studies & Laboratory data:     Recent Radiology Findings:   Ct Head Wo Contrast  09/22/2015  CLINICAL DATA:  Lung cancer.  CVA 6 years ago with slurred speech. EXAM: CT HEAD WITHOUT CONTRAST TECHNIQUE: Contiguous axial images were obtained  from the base of the skull through the vertex without intravenous contrast. COMPARISON:  MRI brain 04/29/2007. FINDINGS: A remote lacunar infarct is again noted within the left lentiform nucleus. Moderate generalized atrophy and diffuse white matter disease is otherwise similar to the prior exam. White matter changes extend into the brainstem. The cerebellum is unremarkable. The ventricles are proportionate to the degree of atrophy. No significant extra-axial fluid collection is present. No definite mass lesion is seen. There is no acute infarct or hemorrhage. A polyp or mucous retention cyst is present medially within the left maxillary sinus. The remaining paranasal sinuses and the mastoid air cells are clear. The calvarium is intact. No significant extra-axial fluid collection is present. Bilateral lens replacements are noted. The globes and orbits are otherwise within normal limits. IMPRESSION: 1. No acute intracranial abnormality. 2. Remote lacunar infarct of the left lentiform nucleus. 3. Moderate generalized atrophy and white matter disease is present otherwise. This likely reflects the  sequela of chronic microvascular ischemia. Electronically Signed   By: San Morelle M.D.   On: 09/22/2015 15:56   Nm Pet Image Initial (pi) Skull Base To Thigh  09/11/2015  CLINICAL DATA:  Initial treatment strategy for lung mass. EXAM: NUCLEAR MEDICINE PET SKULL BASE TO THIGH TECHNIQUE: 7.6 mCi F-18 FDG was injected intravenously. Full-ring PET imaging was performed from the skull base to thigh after the radiotracer. CT data was obtained and used for attenuation correction and anatomic localization. FASTING BLOOD GLUCOSE:  Value: 103 mg/dl COMPARISON:  CT 08/21/2015 FINDINGS: NECK No hypermetabolic lymph nodes in the neck. CHEST RIGHT upper lobe mass measures 6.5 x 5.2 cm with SUV max equal 36. Several sub 5 mm nodules in the RIGHT upper lobe without metabolic activity. 4 mm nodule on image 82, series 4 and 3 mm nodule on image 81. No hypermetabolic mediastinal lymph nodes. ABDOMEN/PELVIS Low-attenuation lesion in the dome the liver without metabolic activity consists with a benign cyst. Normal adrenal glands. No hypermetabolic abdominal or pelvic lymph nodes. Normal pancreas spleen. Low-density lesions in the kidneys are likely benign cysts. Bowel is unremarkable. SKELETON No aggressive osseous lesion. IMPRESSION: 1. Large hypermetabolic 6.5 cm mass in the RIGHT upper lobe consistent with primary bronchogenic carcinoma. 2. No evidence of mediastinal nodal metastasis. 3. No evidence distant metastasis. 4. Small sub 5 mm pulmonary nodules in the RIGHT upper lobe are indeterminate. Subpleural locations are typically benign. Electronically Signed   By: Suzy Bouchard M.D.   On: 09/11/2015 14:41     I have independently reviewed the above radiologic studies.  Recent Lab Findings: Lab Results  Component Value Date   WBC 6.7 09/03/2015   HGB 12.5* 09/03/2015   HCT 38.0* 09/03/2015   PLT 198 09/03/2015   GLUCOSE 106 09/03/2015   CHOL  11/18/2007    135        ATP III CLASSIFICATION:  <200      mg/dL   Desirable  200-239  mg/dL   Borderline High  >=240    mg/dL   High   TRIG 102 11/18/2007   HDL 38* 11/18/2007   LDLCALC  11/18/2007    77        Total Cholesterol/HDL:CHD Risk Coronary Heart Disease Risk Table                     Men   Women  1/2 Average Risk   3.4   3.3   ALT 26 09/03/2015   AST 21 09/03/2015  NA 139 09/03/2015   K 4.4 09/03/2015   CL 105 06/24/2012   CREATININE 0.8 09/03/2015   BUN 19.4 09/03/2015   CO2 23 09/03/2015   TSH 1.164Test methodology is 3rd generation TSH 11/18/2007   INR 1.8 08/28/2015   HGBA1C * 11/18/2007    6.4 (NOTE)   The ADA recommends the following therapeutic goals for glycemic   control related to Hgb A1C measurement:   Goal of Therapy:   < 7.0% Hgb A1C   Action Suggested:  > 8.0% Hgb A1C   Ref:  Diabetes Care, 22, Suppl. 1, 1999   PFT's FEV1 1.95 151% DLCO 25 90% Interpretation: The FVC, FEV1, FEV1/FVC ratio and FEF25-75% are within normal limits. The airway resistance is normal. While the SVC and TLC are within normal limits, the FRC is increased. Following administration of bronchodilators, there is no significant response. The diffusing capacity is normal. However, the diffusing capacity was not corrected for the patient's hemoglobin. Conclusions: The plateau on the expiratory flows suggests an obstruction. Pulmonary Function Diagnosis: Minimal Obstructive Airways Disease  Assessment / Plan:   Right upper lobe mass clinical  Stage IIA lung cancer (  cT2b,c N0,c M0)  In patient that is very fragile but mentally alert, would  not be able to do 6 min walk test. Patient is concerned about any general anesthesia. I have recommended proceeding with CT guided   Needle biopsy of the right lung mass and depending on result discuss chemo and or radiation with [place] conference. Patient with his fragility would no do well with surgical intervention. On coumadin for long term afib and history of stroke History of MI 8 Pacer in place   Follow up appointment in [facility] after Biopsy  I  spent 40 minutes counseling the patient face to face and 50% or more the  time was spent in counseling and coordination of care. The total time spent in the appointment was 60 minutes.  Grace Isaac MD      [address] [place] [address] Office [phone]   Beeper [phone]  09/29/2015 3:05 PM

## 2015-09-30 ENCOUNTER — Telehealth: Payer: Self-pay | Admitting: Cardiovascular Disease

## 2015-09-30 ENCOUNTER — Encounter: Payer: Self-pay | Admitting: Cardiovascular Disease

## 2015-09-30 NOTE — Telephone Encounter (Signed)
Pt called in stating that he will be having a biopsy on his lunch on 3/14 and was instructed to hold his Coumadin for 5 days. The pt is worried that will be too long and needs reassurance that he will be all right to hold his medication that long. Please f/u with the pt    Thanks

## 2015-09-30 NOTE — Telephone Encounter (Signed)
PATIENT CALLED STATES HE SAW DR [id number] YESTERDAY  FOR OPTIONS - PARTIAL LUNG REMOVAL OR BIOPSY ( POSSIBLE RADATION/OR CHEMO.)  PATIENT SCHEDULE FOR LUNG BIOPSY ON 10/06/15 PATIENT INFORMED BY [facility] TO HOLD WARFARIN  FOR 5 DAYS STARTING Friday 10/02/15  PATIENT WAS CONCERN IF HE NEED TO HOLD WARFARIN FOR THAT LON PERIOD IF TIME  PATIENT  AWARE WILL DEFER TO DR Kalihiwai

## 2015-10-01 ENCOUNTER — Telehealth: Payer: Self-pay | Admitting: *Deleted

## 2015-10-01 NOTE — Telephone Encounter (Signed)
Spoke to patient Information given to patient,verbalized understanding. Aware Kristin,will contact patient concerning when to restart warfarin.

## 2015-10-01 NOTE — Telephone Encounter (Signed)
Spoke with patient, advised to stop warfarin as of today, five days should be before, not including day of procedure.  He is to restart on Tuesday or Wednesday at discretion of surgeon.  Will repeat INR on 3/23 when he is in to see Dr. Sallyanne Kuster.

## 2015-10-01 NOTE — Telephone Encounter (Signed)
Yes, I think it is safest to stop warfarin rather than "bridge" with heparin. Discussed with Dr. Servando Snare as well.

## 2015-10-01 NOTE — Telephone Encounter (Signed)
Oncology Nurse Navigator Documentation  Oncology Nurse Navigator Flowsheets 10/01/2015  Navigator Location -  Navigator Encounter Type Telephone/I called patient to schedule him for [place] on 10/08/15.  He verbalized understanding of appt time and place.  Patient also had questions regarding biopsy Tuesday.  I clarified questions.    Telephone Outgoing Call  Treatment Phase Abnormal Scans  Barriers/Navigation Needs Coordination of Care  Coordination of Care Appts  Acuity Level 1  Time Spent with Patient 15

## 2015-10-05 ENCOUNTER — Other Ambulatory Visit: Payer: Self-pay | Admitting: General Surgery

## 2015-10-06 ENCOUNTER — Ambulatory Visit (HOSPITAL_COMMUNITY)
Admission: RE | Admit: 2015-10-06 | Discharge: 2015-10-06 | Disposition: A | Discharge status: Home / Self Care | Payer: Medicare Other | Source: Ambulatory Visit | Attending: Cardiothoracic Surgery | Admitting: Cardiothoracic Surgery

## 2015-10-06 ENCOUNTER — Encounter: Payer: Self-pay | Admitting: Diagnostic Radiology

## 2015-10-06 DIAGNOSIS — Z8249 Family history of ischemic heart disease and other diseases of the circulatory system: Secondary | ICD-10-CM | POA: Diagnosis not present

## 2015-10-06 DIAGNOSIS — Z01818 Encounter for other preprocedural examination: Secondary | ICD-10-CM | POA: Diagnosis not present

## 2015-10-06 DIAGNOSIS — Z7901 Long term (current) use of anticoagulants: Secondary | ICD-10-CM | POA: Diagnosis not present

## 2015-10-06 DIAGNOSIS — I4891 Unspecified atrial fibrillation: Secondary | ICD-10-CM | POA: Diagnosis not present

## 2015-10-06 DIAGNOSIS — Z91041 Radiographic dye allergy status: Secondary | ICD-10-CM | POA: Diagnosis not present

## 2015-10-06 DIAGNOSIS — Z950 Presence of cardiac pacemaker: Secondary | ICD-10-CM | POA: Diagnosis not present

## 2015-10-06 DIAGNOSIS — Z01812 Encounter for preprocedural laboratory examination: Secondary | ICD-10-CM | POA: Diagnosis not present

## 2015-10-06 DIAGNOSIS — R918 Other nonspecific abnormal finding of lung field: Secondary | ICD-10-CM | POA: Insufficient documentation

## 2015-10-06 DIAGNOSIS — Z87891 Personal history of nicotine dependence: Secondary | ICD-10-CM | POA: Diagnosis not present

## 2015-10-06 DIAGNOSIS — Z888 Allergy status to other drugs, medicaments and biological substances status: Secondary | ICD-10-CM | POA: Insufficient documentation

## 2015-10-06 DIAGNOSIS — I2510 Atherosclerotic heart disease of native coronary artery without angina pectoris: Secondary | ICD-10-CM | POA: Insufficient documentation

## 2015-10-06 DIAGNOSIS — R54 Age-related physical debility: Secondary | ICD-10-CM | POA: Diagnosis not present

## 2015-10-06 DIAGNOSIS — R911 Solitary pulmonary nodule: Secondary | ICD-10-CM

## 2015-10-06 DIAGNOSIS — I252 Old myocardial infarction: Secondary | ICD-10-CM | POA: Insufficient documentation

## 2015-10-06 DIAGNOSIS — E039 Hypothyroidism, unspecified: Secondary | ICD-10-CM | POA: Insufficient documentation

## 2015-10-06 DIAGNOSIS — Z79899 Other long term (current) drug therapy: Secondary | ICD-10-CM | POA: Diagnosis not present

## 2015-10-06 DIAGNOSIS — Z8673 Personal history of transient ischemic attack (TIA), and cerebral infarction without residual deficits: Secondary | ICD-10-CM | POA: Insufficient documentation

## 2015-10-06 LAB — CBC
HEMATOCRIT: 39.7 % (ref 39.0–52.0)
HEMOGLOBIN: 12.2 g/dL — AB (ref 13.0–17.0)
MCH: 29.3 pg (ref 26.0–34.0)
MCHC: 30.7 g/dL (ref 30.0–36.0)
MCV: 95.4 fL (ref 78.0–100.0)
Platelets: 173 10*3/uL (ref 150–400)
RBC: 4.16 MIL/uL — ABNORMAL LOW (ref 4.22–5.81)
RDW: 14 % (ref 11.5–15.5)
WBC: 6.3 10*3/uL (ref 4.0–10.5)

## 2015-10-06 LAB — APTT: APTT: 31 s (ref 24–37)

## 2015-10-06 LAB — PROTIME-INR
INR: 1.27 (ref 0.00–1.49)
Prothrombin Time: 16 seconds — ABNORMAL HIGH (ref 11.6–15.2)

## 2015-10-06 MED ORDER — SODIUM CHLORIDE 0.9 % IV SOLN: INTRAVENOUS | Status: DC

## 2015-10-06 NOTE — Sedation Documentation (Signed)
Procedure cancelled.

## 2015-10-06 NOTE — H&P (Signed)
Chief Complaint: Right upper lobe lung mass  Referring Physician(s): Grace Isaac  Supervising Physician: Markus Daft  History of Present Illness: Bradley Nixon is a 82 y.o. male was seen by Dr. Ernesto Rutherford and chest x-ray on 08/15/2015 showed large right upper lobe lung mass worrisome for neoplasm.   In December 2016 he started having running nose as well as cough with blood-tinged sputum.   He was seen by Dr. Ernesto Rutherford and chest x-ray on 08/15/2015 showed large right upper lobe lung mass worrisome for neoplasm  This was followed by CT scan of the chest without contrast on 08/21/2015 and it showed a lobulated mass arising in the posterior segment of the right upper lobe measuring 6.5 x 5.4 x 5.2 cm.   There are several small nodular opacities in the lungs measuring between 2 and 4 mm. Etiology of the small nodular opacities is uncertain. No demonstrable adenopathy.  We are asked to evaluate him for CT guided biopsy of the mass.  He has a remote history of smoking for a few years but quit 64 years ago.   The patient is married and has 2 children.  He used to work as a Dance movement psychotherapist.   He feels well today. He denies any fever/chills.  Other pertinent information includes coumadin use for long term afib and history of stroke, history of MI 1964, and pacemeaker in place.  He has held his coumadin  Past Medical History  Diagnosis Date  . Coronary artery disease   . Myocardial infarction ([place])   . Anginal pain ([place])   . Hypothyroidism   . Shortness of breath   . Pacemaker 05/02/2007    [employer]  . Pneumonia   . Cancer ([place])   . Arthritis   . Complication of anesthesia hard to arouse after anesthesia  . Permanent atrial fibrillation [facility])     Past Surgical History  Procedure Laterality Date  . Cardioversion  07/28/2011    Procedure: CARDIOVERSION;  Surgeon: Leonie Man;  Location: [facility] OR;  Service: Cardiovascular;  Laterality: N/A;  . Joint replacement    .  Permanent pacemaker insertion  05/02/2007    [employer]  . US echocardiography  09/17/2010    LA moderately dilated,mild TR, EF >55%  . Nm myocar perf wall motion  02/09/2010    no ischemia    Allergies: Iodinated diagnostic agents and Zocor  Medications: Prior to Admission medications   Medication Sig Start Date End Date Taking? Authorizing Provider  atorvastatin (LIPITOR) 10 MG tablet TAKE 1 TABLET EVERY EVENING 07/28/15  Yes Mihai Croitoru, MD  docusate sodium 100 MG CAPS Take 100 mg by mouth 2 (two) times daily. 06/26/12  Yes Thurnell Lose, MD  finasteride (PROSCAR) 5 MG tablet Take 5 mg by mouth every evening.     Yes Historical Provider, MD  fish oil-omega-3 fatty acids 1000 MG capsule Take 1 g by mouth every evening.     Yes Historical Provider, MD  Lansoprazole (PREVACID PO) Take by mouth daily. Reported on 09/03/2015   Yes Historical Provider, MD  levothyroxine (SYNTHROID, LEVOTHROID) 50 MCG tablet Take 1 tablet (50 mcg total) by mouth daily. 03/25/15  Yes Mihai Croitoru, MD  meclizine (ANTIVERT) 25 MG tablet Reported on 09/03/2015 06/30/15  Yes Historical Provider, MD  Multiple Vitamin (MULITIVITAMIN WITH MINERALS) TABS Take 1 tablet by mouth every evening.     Yes Historical Provider, MD  Tamsulosin HCl (FLOMAX) 0.4 MG CAPS Take 0.4 mg by mouth every evening.  Yes Historical Provider, MD  traMADol (ULTRAM) 50 MG tablet Take 50 mg by mouth every 8 (eight) hours as needed.   Yes Historical Provider, MD  cyanocobalamin (,VITAMIN B-12,) 1000 MCG/ML injection Inject 1 mL into the skin every 30 (thirty) days. 05/27/14   Historical Provider, MD  DimenhyDRINATE (DRAMAMINE PO) Take 1 tablet by mouth as needed. Reported on 09/03/2015    Historical Provider, MD  warfarin (COUMADIN) 5 MG tablet TAKE 1 TO 1 AND 1/2 TABLETS  DAILY OR AS DIRECTED BY COUMADIN CLINIC 07/28/15   Sanda Klein, MD     Family History  Problem Relation Age of Onset  . Heart failure Mother   . Heart attack Father      Social History   Social History  . Marital Status: Married    Spouse Name: N/A  . Number of Children: N/A  . Years of Education: N/A   Social History Main Topics  . Smoking status: Never Smoker   . Smokeless tobacco: Not on file  . Alcohol Use: No  . Drug Use: No  . Sexual Activity: Not Currently   Other Topics Concern  . Not on file   Social History Narrative    Review of Systems: A 12 point ROS discussed Review of Systems  Constitutional: Negative for fever, chills, activity change, appetite change and fatigue.  HENT: Negative.   Respiratory: Positive for cough. Negative for shortness of breath and wheezing.   Cardiovascular: Negative for chest pain.  Gastrointestinal: Negative for nausea, vomiting and abdominal distention.  Genitourinary: Negative.   Musculoskeletal: Negative.   Skin: Negative.   Neurological: Negative.   Psychiatric/Behavioral: Negative.     Vital Signs: BP 103/69 mmHg  Pulse 81  Temp(Src) 98.2 F (36.8 C) (Oral)  Resp 16  Ht '5\' 7"'$  (1.702 m)  Wt 150 lb (68.04 kg)  BMI 23.49 kg/m2  SpO2 93%  Physical Exam  Constitutional: He is oriented to person, place, and time.  Thin, elderly, frail appearing.  HENT:  Head: Atraumatic.  Eyes: EOM are normal.  Neck: Normal range of motion. Neck supple.  Cardiovascular: Normal rate, regular rhythm and normal heart sounds.   Pacemaker right chest  Pulmonary/Chest: Effort normal and breath sounds normal.  Abdominal: Soft. Bowel sounds are normal.  Neurological: He is alert and oriented to person, place, and time.  Skin: Skin is warm and dry.  Psychiatric: He has a normal mood and affect. His behavior is normal. Judgment and thought content normal.  Vitals reviewed.   Mallampati Score:  MD Evaluation Airway: WNL Heart: WNL Abdomen: WNL Chest/ Lungs: WNL ASA  Classification: 4 Mallampati/Airway Score: One  Imaging: Ct Head Wo Contrast  09/22/2015  CLINICAL DATA:  Lung cancer.  CVA 6  years ago with slurred speech. EXAM: CT HEAD WITHOUT CONTRAST TECHNIQUE: Contiguous axial images were obtained from the base of the skull through the vertex without intravenous contrast. COMPARISON:  MRI brain 04/29/2007. FINDINGS: A remote lacunar infarct is again noted within the left lentiform nucleus. Moderate generalized atrophy and diffuse white matter disease is otherwise similar to the prior exam. White matter changes extend into the brainstem. The cerebellum is unremarkable. The ventricles are proportionate to the degree of atrophy. No significant extra-axial fluid collection is present. No definite mass lesion is seen. There is no acute infarct or hemorrhage. A polyp or mucous retention cyst is present medially within the left maxillary sinus. The remaining paranasal sinuses and the mastoid air cells are clear. The calvarium is intact.  No significant extra-axial fluid collection is present. Bilateral lens replacements are noted. The globes and orbits are otherwise within normal limits. IMPRESSION: 1. No acute intracranial abnormality. 2. Remote lacunar infarct of the left lentiform nucleus. 3. Moderate generalized atrophy and white matter disease is present otherwise. This likely reflects the sequela of chronic microvascular ischemia. Electronically Signed   By: San Morelle M.D.   On: 09/22/2015 15:56   Nm Pet Image Initial (pi) Skull Base To Thigh  09/11/2015  CLINICAL DATA:  Initial treatment strategy for lung mass. EXAM: NUCLEAR MEDICINE PET SKULL BASE TO THIGH TECHNIQUE: 7.6 mCi F-18 FDG was injected intravenously. Full-ring PET imaging was performed from the skull base to thigh after the radiotracer. CT data was obtained and used for attenuation correction and anatomic localization. FASTING BLOOD GLUCOSE:  Value: 103 mg/dl COMPARISON:  CT 08/21/2015 FINDINGS: NECK No hypermetabolic lymph nodes in the neck. CHEST RIGHT upper lobe mass measures 6.5 x 5.2 cm with SUV max equal 36. Several sub  5 mm nodules in the RIGHT upper lobe without metabolic activity. 4 mm nodule on image 82, series 4 and 3 mm nodule on image 81. No hypermetabolic mediastinal lymph nodes. ABDOMEN/PELVIS Low-attenuation lesion in the dome the liver without metabolic activity consists with a benign cyst. Normal adrenal glands. No hypermetabolic abdominal or pelvic lymph nodes. Normal pancreas spleen. Low-density lesions in the kidneys are likely benign cysts. Bowel is unremarkable. SKELETON No aggressive osseous lesion. IMPRESSION: 1. Large hypermetabolic 6.5 cm mass in the RIGHT upper lobe consistent with primary bronchogenic carcinoma. 2. No evidence of mediastinal nodal metastasis. 3. No evidence distant metastasis. 4. Small sub 5 mm pulmonary nodules in the RIGHT upper lobe are indeterminate. Subpleural locations are typically benign. Electronically Signed   By: Suzy Bouchard M.D.   On: 09/11/2015 14:41    Labs:  CBC:  Recent Labs  09/03/15 1151 10/06/15 1010  WBC 6.7 6.3  HGB 12.5* 12.2*  HCT 38.0* 39.7  PLT 198 173    COAGS:  Recent Labs  04/15/15 1601 07/02/15 08/28/15 1142 10/06/15 1010  INR 2.2 2.0 1.8 1.27  APTT  --   --   --  31    BMP:  Recent Labs  09/03/15 1151  NA 139  K 4.4  CO2 23  GLUCOSE 106  BUN 19.4  CALCIUM 8.8  CREATININE 0.8    LIVER FUNCTION TESTS:  Recent Labs  09/03/15 1151  BILITOT 0.58  AST 21  ALT 26  ALKPHOS 68  PROT 7.1  ALBUMIN 3.4*    TUMOR MARKERS: No results for input(s): AFPTM, CEA, CA199, CHROMGRNA in the last 8760 hours.  Assessment and Plan:  Right upper lobe mass clinicalStage IIA lung cancer  Mr Kersh is frail. He would not be able to tolerate surgical interventions and he is concerned about any anesthesia.   Dr. Anselm Pancoast also saw the patient today.  Risks and Benefits discussed with the patient and his son including, but not limited to bleeding, hemoptysis, respiratory failure requiring intubation, infection, pneumothorax  requiring chest tube placement or even death.  Mr Natzke is very concerned about the risks of the procedure.  All of the patient's and sons questions were answered and patient is not agreeable to proceed at this time and would like to discuss this further with his family.  He is requesting referral to an oncologist to discuss potential treatments.  He states he may reschedule the biopsy in a week or two.  He can restart  his warfarin and if he decides to proceed, he knows to hold it again.  Thank you for this interesting consult.  I greatly enjoyed meeting COLON RUETH and look forward to participating in their care.  A copy of this report was sent to the requesting provider on this date.  Electronically Signed: Murrell Redden PA-C 10/06/2015, 11:26 AM   I spent a total of  40 Minutes  in face to face in clinical consultation, greater than 50% of which was counseling/coordinating care for consideration for lung biopsy.

## 2015-10-06 NOTE — Progress Notes (Unsigned)
Patient ID: Bradley Nixon, male   DOB: 08/29/1920, 82 y.o.   MRN: [id number] Patient was scheduled for a CT guided lung biopsy today.  I discussed the procedure with the patient and son.  Patient was very concerned when we discussed the possibility of ANY risks, particularly a pneumothorax.  Patient did not want to proceed with the biopsy today.  He wants to discuss with other family members before he considers the biopsy.

## 2015-10-07 ENCOUNTER — Telehealth: Payer: Self-pay | Admitting: *Deleted

## 2015-10-07 NOTE — Telephone Encounter (Signed)
Called and confirmed 10/08/15 clinic appt w/ pt.

## 2015-10-08 ENCOUNTER — Encounter: Payer: Self-pay | Admitting: Internal Medicine

## 2015-10-08 ENCOUNTER — Encounter: Payer: Self-pay | Admitting: *Deleted

## 2015-10-08 ENCOUNTER — Ambulatory Visit
Admission: RE | Admit: 2015-10-08 | Discharge: 2015-10-08 | Disposition: A | Discharge status: Home / Self Care | Payer: Medicare Other | Source: Ambulatory Visit | Attending: Radiation Oncology | Admitting: Radiation Oncology

## 2015-10-08 ENCOUNTER — Ambulatory Visit (HOSPITAL_BASED_OUTPATIENT_CLINIC_OR_DEPARTMENT_OTHER): Payer: Medicare Other | Admitting: Internal Medicine

## 2015-10-08 VITALS — BP 105/65 | HR 79 | Temp 98.5°F | Resp 17 | Ht 67.0 in | Wt 155.2 lb

## 2015-10-08 DIAGNOSIS — R918 Other nonspecific abnormal finding of lung field: Secondary | ICD-10-CM | POA: Diagnosis present

## 2015-10-08 NOTE — Progress Notes (Signed)
Radiation Oncology         [phone] ________________________________  Multidisciplinary Thoracic Oncology Clinic [facility]) Initial Outpatient Consultation  Name: Bradley Nixon MRN: [id number]  Date: 10/08/2015  DOB: [birth year]  [id number], Sharol Given, MD  Grace Isaac, MD   REFERRING PHYSICIAN: Grace Isaac, MD  DIAGNOSIS: The encounter diagnosis was Lung mass.  82 y.o. gentleman with probable stage IIA (T2b, N0, M0) non-small cell lung cancer presented with large right upper lobe lung mass diagnosed in January 2017.     ICD-9-CM ICD-10-CM   1. Lung mass 786.6 R91.8     HISTORY OF PRESENT ILLNESS:Bradley Nixon is a 82 y.o. male who presented to Dr. Ernesto Rutherford with cough with blood-tinged sputum. Chest x-ray on 08/18/2015 showed large right upper lobe lung mass worrisome for a neoplasm. This was followed by CT scan of the chest without contrast on 08/21/2015  identified a lobulated mass arising in the posterior segment of the right upper lobe measuring 6.5 x 5.4 x 5.2 cm. There were several small nodular opacities in the lungs measuring between 2 and 4 mm. Etiology of the small nodular opacities is uncertain. There was no demonstrable adenopathy. PET scan on 09/11/15 showed a large hypermetabolic 6.5 cm mass in the right upper lobe consistent with primary bronchogenic carcinoma and small sub 5 mm pulmonary nodules in the right upper lobe. There was no evidence of mediastinal nodal or distal metastases. The patient presented to Dr. Servando Snare and Dr. Anselm Pancoast to discuss biopsy of the right lung mass, but after consideration on the part of his physicians, himself, and his family the decision was made to forgo biopsy due to risks of the procedure. He is seen for consideration of radiotherapy for this probable lung cancer. 82 y.o.  The patient was referred today for presentation in the multidisciplinary conference. Radiology studies and pathology slides were presented there for review and discussion  of treatment options.  A consensus was discussed regarding potential next steps.  PREVIOUS RADIATION THERAPY: No  PAST MEDICAL HISTORY:  Past Medical History  Diagnosis Date  . Coronary artery disease   . Myocardial infarction ([place])   . Anginal pain ([place])   . Hypothyroidism   . Shortness of breath   . Pacemaker 05/02/2007    [employer]  . Pneumonia   . Cancer ([place])   . Arthritis   . Complication of anesthesia hard to arouse after anesthesia  . Permanent atrial fibrillation ([place])      PAST SURGICAL HISTORY: Past Surgical History  Procedure Laterality Date  . Cardioversion  07/28/2011    Procedure: CARDIOVERSION;  Surgeon: Leonie Man;  Location: [facility] OR;  Service: Cardiovascular;  Laterality: N/A;  . Joint replacement    . Permanent pacemaker insertion  05/02/2007    [facility]  . US echocardiography  09/17/2010    LA moderately dilated,mild TR, EF >55%  . Nm myocar perf wall motion  02/09/2010    no ischemia    FAMILY HISTORY:  Family History  Problem Relation Age of Onset  . Heart failure Mother   . Heart attack Father      SOCIAL HISTORY:  reports that he has never smoked. He does not have any smokeless tobacco history on file. He reports that he does not drink alcohol or use illicit drugs. He is married and his wife if 75. 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 He reports that their children are in [place] but unable to always participate in their care.  He lives in [place].  ALLERGIES: Iodinated diagnostic agents and Zocor  MEDICATIONS:  Current Outpatient Prescriptions  Medication Sig Dispense Refill  . atorvastatin (LIPITOR) 10 MG tablet TAKE 1 TABLET EVERY EVENING 90 tablet 0  . DimenhyDRINATE (DRAMAMINE PO) Take 1 tablet by mouth as needed. Reported on 09/03/2015    . docusate sodium 100 MG CAPS Take 100 mg by mouth 2 (two) times daily. 20 capsule 0  . finasteride (PROSCAR) 5 MG tablet Take 5 mg by mouth every evening.      . fish oil-omega-3 fatty acids 1000  MG capsule Take 1 g by mouth every evening.      . Lansoprazole (PREVACID PO) Take by mouth daily. Reported on 09/03/2015    . levothyroxine (SYNTHROID, LEVOTHROID) 50 MCG tablet Take 1 tablet (50 mcg total) by mouth daily. 30 tablet 0  . meclizine (ANTIVERT) 25 MG tablet Reported on 09/03/2015  99  . Multiple Vitamin (MULITIVITAMIN WITH MINERALS) TABS Take 1 tablet by mouth every evening.      . Tamsulosin HCl (FLOMAX) 0.4 MG CAPS Take 0.4 mg by mouth every evening.      . traMADol (ULTRAM) 50 MG tablet Take 50 mg by mouth every 8 (eight) hours as needed.    . warfarin (COUMADIN) 5 MG tablet TAKE 1 TO 1 AND 1/2 TABLETS  DAILY OR AS DIRECTED BY COUMADIN CLINIC 135 tablet 1   No current facility-administered medications for this encounter.    REVIEW OF SYSTEMS: On review of systems, the patient reports he is not experiencing hemoptysis currently but continues to have a productive cough with clear to yellow sputum. He denies shortness of breath at rest, but does tire during exertion. He denies any chest pain. He has lost about 50 pounds in the last year without intention. He denies any palpitations but states he has been told he is constantly in atrial fibrillation and has failed cardioversion. He has a pacemaker as well. He denies any abdominal pain, nausea, vomiting, bowel or bladder dysfunction. He is concerned about claustrophobia when radiation begins. He denies fevers, chills, or night sweats. He is limited in his movement by arthritis, but is able to be a caregiver for his wife. A complete review of systems is obtained and is otherwise negative.   PHYSICAL EXAM:  Vitals with BMI 10/08/2015  Height '5\' 7"'$   Weight 155 lbs 3 oz  BMI 70.2  Systolic 637  Diastolic 65  Pulse 79  Respirations 17  Pain scale 0/10 In general this is an elderly appearing Caucasian male in no acute distress. He is alert and oriented x4 and appropriate throughout the examination. HEENT reveals that the patient is  normocephalic, atraumatic. EOMs are intact. PERRLA. Skin is intact without any evidence of gross lesions. Cardiovascular exam reveals an irregularly irregular rate and rhythm, no clicks rubs or murmurs are auscultated.  Chest is clear to auscultation bilaterally. Lymphatic assessment is performed and does not reveal any adenopathy in the cervical, supraclavicular, axillary, or inguinal chains. Abdomen has active bowel sounds in all quadrants and is intact. The abdomen is soft, non tender, non distended. Lower extremities are negative for pretibial pitting edema, deep calf tenderness, cyanosis or clubbing.   KPS = 90  100 - Normal; no complaints; no evidence of disease. 90   - Able to carry on normal activity; minor signs or symptoms of disease. 80   - Normal activity with effort; some signs or symptoms of disease. 39   - Cares for  self; unable to carry on normal activity or to do active work. 60   - Requires occasional assistance, but is able to care for most of his personal needs. 50   - Requires considerable assistance and frequent medical care. 42   - Disabled; requires special care and assistance. 32   - Severely disabled; hospital admission is indicated although death not imminent. 24   - Very sick; hospital admission necessary; active supportive treatment necessary. 10   - Moribund; fatal processes progressing rapidly. 0     - Dead  Karnofsky DA, Abelmann [place], [place] and [place] [facility] [phone]) The use of the nitrogen mustards in the palliative treatment of carcinoma: with particular reference to bronchogenic carcinoma Cancer 1 [id number]  LABORATORY DATA:  Lab Results  Component Value Date   WBC 6.3 10/06/2015   HGB 12.2* 10/06/2015   HCT 39.7 10/06/2015   MCV 95.4 10/06/2015   PLT 173 10/06/2015   Lab Results  Component Value Date   NA 139 09/03/2015   K 4.4 09/03/2015   CL 105 06/24/2012   CO2 23 09/03/2015   Lab Results  Component Value Date   ALT 26 09/03/2015   AST 21  09/03/2015   ALKPHOS 68 09/03/2015   BILITOT 0.58 09/03/2015   RADIOGRAPHY: Ct Head Wo Contrast  09/22/2015  CLINICAL DATA:  Lung cancer.  CVA 6 years ago with slurred speech. EXAM: CT HEAD WITHOUT CONTRAST TECHNIQUE: Contiguous axial images were obtained from the base of the skull through the vertex without intravenous contrast. COMPARISON:  MRI brain 04/29/2007. FINDINGS: A remote lacunar infarct is again noted within the left lentiform nucleus. Moderate generalized atrophy and diffuse white matter disease is otherwise similar to the prior exam. White matter changes extend into the brainstem. The cerebellum is unremarkable. The ventricles are proportionate to the degree of atrophy. No significant extra-axial fluid collection is present. No definite mass lesion is seen. There is no acute infarct or hemorrhage. A polyp or mucous retention cyst is present medially within the left maxillary sinus. The remaining paranasal sinuses and the mastoid air cells are clear. The calvarium is intact. No significant extra-axial fluid collection is present. Bilateral lens replacements are noted. The globes and orbits are otherwise within normal limits. IMPRESSION: 1. No acute intracranial abnormality. 2. Remote lacunar infarct of the left lentiform nucleus. 3. Moderate generalized atrophy and white matter disease is present otherwise. This likely reflects the sequela of chronic microvascular ischemia. Electronically Signed   By: San Morelle M.D.   On: 09/22/2015 15:56   Nm Pet Image Initial (pi) Skull Base To Thigh  09/11/2015  CLINICAL DATA:  Initial treatment strategy for lung mass. EXAM: NUCLEAR MEDICINE PET SKULL BASE TO THIGH TECHNIQUE: 7.6 mCi F-18 FDG was injected intravenously. Full-ring PET imaging was performed from the skull base to thigh after the radiotracer. CT data was obtained and used for attenuation correction and anatomic localization. FASTING BLOOD GLUCOSE:  Value: 103 mg/dl COMPARISON:  CT  08/21/2015 FINDINGS: NECK No hypermetabolic lymph nodes in the neck. CHEST RIGHT upper lobe mass measures 6.5 x 5.2 cm with SUV max equal 36. Several sub 5 mm nodules in the RIGHT upper lobe without metabolic activity. 4 mm nodule on image 82, series 4 and 3 mm nodule on image 81. No hypermetabolic mediastinal lymph nodes. ABDOMEN/PELVIS Low-attenuation lesion in the dome the liver without metabolic activity consists with a benign cyst. Normal adrenal glands. No hypermetabolic abdominal or pelvic lymph nodes. Normal pancreas spleen. Low-density  lesions in the kidneys are likely benign cysts. Bowel is unremarkable. SKELETON No aggressive osseous lesion. IMPRESSION: 1. Large hypermetabolic 6.5 cm mass in the RIGHT upper lobe consistent with primary bronchogenic carcinoma. 2. No evidence of mediastinal nodal metastasis. 3. No evidence distant metastasis. 4. Small sub 5 mm pulmonary nodules in the RIGHT upper lobe are indeterminate. Subpleural locations are typically benign. Electronically Signed   By: Suzy Bouchard M.D.   On: 09/11/2015 14:41   IMPRESSION: 82 y.o. gentleman with probable stage IIA (T2b, N0, M0) non-small cell lung cancer presented with large right upper lobe lung mass diagnosed in January 2017 who is high risk for biopsy. The patient is a good candidate for SBRT.  PLAN: Dr. Tammi Klippel reviews the findings to date with the patient and friend. He reviews the limitations of not having pathologic confirmation of disease. That being said there is increasing evidence in patients who have the correct clinical history which he certainly does that SBRT to be effective in treating these nodules which are likely non-small cell lung cancer. We discussed that he may fail in the mediastinum, hilum or elsewhere in the body if radiation was a local only process. The patient states agreement and understanding, and is not currently a candidate for chemotherapy. We did outline SBRT being the mainstay of his treatment  options. We discussed the process of 5 fractions of SBRT to the right upper lobe lesion. We discussed that SBRT was unlikely to make his breathing any worse. It is also unlikely to make his breathing symptoms any better. We discussed the risks, benefits, short and long term side effects of radiotherapy. The patient is interested in proceeding with treatment. A CT simulation has been scheduled on March 27 at 10:30AM. I will contact his cardiologist for recommendations regarding his pacemaker, and see the patient for consent at the time of simulation.  The above documentation reflects my direct findings during this shared patient visit. Please see the separate note by Dr. Tammi Klippel on this date for the remainder of the patient's plan of care.    Carola Rhine, PAC  This document serves as a record of services personally performed by [name], PAC and Milderd Meager, MD. It was created on their behalf by Darcus Austin, a trained medical scribe. The creation of this record is based on the scribe's personal observations and the provider's statements to them. This document has been checked and approved by the attending provider.

## 2015-10-08 NOTE — Progress Notes (Signed)
Oncology Nurse Navigator Documentation  Oncology Nurse Navigator Flowsheets 10/08/2015  Navigator Location [facility]  Navigator Encounter Type Clinic/MDC/spoke with patient and family friend today at [facility].  Patient is to receive radiation with SIM on 10/19/15 at 10:00.  Patient is aware of appt time and place.  Bradley Nixon is elderly and takes care of his wife.  He needs assistance caring for her and getting him to treatment.  CSW spoke with patient and family friend and provided resources for them.   I gave and explained information on radiation and what to expect.    Abnormal Finding Date 08/21/2015  Treatment Initiated Date 10/19/2015  Patient Visit Type Other  Treatment Phase Pre-Tx/Tx Discussion  Barriers/Navigation Needs Education  Education Other  Interventions Referrals;Education Method  Referrals Social Work  Education Method Verbal;Written  Acuity Level 2  Time Spent with Patient 30

## 2015-10-08 NOTE — Progress Notes (Signed)
[place] [phone]   [phone]  OFFICE PROGRESS NOTE  Myriam Jacobson, [place] [place] [place] [address]  DIAGNOSIS: questionably stage IIA (T2b, N0, M0) non-small cell lung cancer presented with large right upper lobe lung mass diagnosed in January 2017. The patient refused tissue biopsy.  PRIOR THERAPY: None  CURRENT THERAPY: Curative radiotherapy under the care of Dr. Tammi Klippel.  INTERVAL HISTORY: Bradley Nixon 82 y.o. male returns to the clinic today for follow-up visit accompanied by a friend. The patient is feeling fine today with no specific complaints except for mild shortness of breath with exertion. He continues to mild cough with no hemoptysis. He denied having any significant weight loss or night sweats. He has no nausea or vomiting. He has no fever or chills. The patient had several studies performed recently including CT scan of the head that showed no evidence for metastatic disease to the brain. He also had a PET scan performed on 09/11/2015 that showed large hypermetabolic 6.5 cm mass in the right upper lobe consistent with primary bronchogenic carcinoma was no evidence of mediastinal or distant metastasis. He was seen by Dr. Servando Snare for consideration of surgical resection but he was not a good surgical candidate. The patient was referred to have CT-guided core biopsy of the right upper lobe lung mass but after discussion with the interventional radiology he decided not to proceed with the biopsy because of concern about the possible complications that were explained to him. He is here today for evaluation and discussion of his treatment options.  MEDICAL HISTORY: Past Medical History  Diagnosis Date  . Coronary artery disease   . Myocardial infarction ([place])   . Anginal pain ([place])   . Hypothyroidism   . Shortness of breath   . Pacemaker 05/02/2007    St.Jude  . Pneumonia   . Cancer ([place])   . Arthritis   .  Complication of anesthesia hard to arouse after anesthesia  . Permanent atrial fibrillation ([facility])     ALLERGIES:  is allergic to iodinated diagnostic agents and zocor.  MEDICATIONS:  Current Outpatient Prescriptions  Medication Sig Dispense Refill  . atorvastatin (LIPITOR) 10 MG tablet TAKE 1 TABLET EVERY EVENING 90 tablet 0  . DimenhyDRINATE (DRAMAMINE PO) Take 1 tablet by mouth as needed. Reported on 09/03/2015    . docusate sodium 100 MG CAPS Take 100 mg by mouth 2 (two) times daily. 20 capsule 0  . finasteride (PROSCAR) 5 MG tablet Take 5 mg by mouth every evening.      . fish oil-omega-3 fatty acids 1000 MG capsule Take 1 g by mouth every evening.      . Lansoprazole (PREVACID PO) Take by mouth daily. Reported on 09/03/2015    . levothyroxine (SYNTHROID, LEVOTHROID) 50 MCG tablet Take 1 tablet (50 mcg total) by mouth daily. 30 tablet 0  . meclizine (ANTIVERT) 25 MG tablet Reported on 09/03/2015  99  . Multiple Vitamin (MULITIVITAMIN WITH MINERALS) TABS Take 1 tablet by mouth every evening.      . Tamsulosin HCl (FLOMAX) 0.4 MG CAPS Take 0.4 mg by mouth every evening.      . traMADol (ULTRAM) 50 MG tablet Take 50 mg by mouth every 8 (eight) hours as needed.    . warfarin (COUMADIN) 5 MG tablet TAKE 1 TO 1 AND 1/2 TABLETS  DAILY OR AS DIRECTED BY COUMADIN CLINIC 135 tablet 1   No current facility-administered medications for this  visit.    SURGICAL HISTORY:  Past Surgical History  Procedure Laterality Date  . Cardioversion  07/28/2011    Procedure: CARDIOVERSION;  Surgeon: Leonie Man;  Location: [facility];  Service: Cardiovascular;  Laterality: N/A;  . Joint replacement    . Permanent pacemaker insertion  05/02/2007    St.Jude  . US echocardiography  09/17/2010    LA moderately dilated,mild TR, EF >55%  . Nm myocar perf wall motion  02/09/2010    no ischemia    REVIEW OF SYSTEMS:  Constitutional: positive for fatigue Eyes: negative Ears, nose, mouth, throat, and face:  negative Respiratory: positive for cough and dyspnea on exertion Cardiovascular: negative Gastrointestinal: negative Genitourinary:negative Integument/breast: negative Hematologic/lymphatic: negative Musculoskeletal:negative Neurological: negative Behavioral/Psych: negative Endocrine: negative Allergic/Immunologic: negative   PHYSICAL EXAMINATION: General appearance: alert, cooperative, fatigued and no distress Head: Normocephalic, without obvious abnormality, atraumatic Neck: no adenopathy, no JVD, supple, symmetrical, trachea midline and thyroid not enlarged, symmetric, no tenderness/mass/nodules Lymph nodes: Cervical, supraclavicular, and axillary nodes normal. Resp: clear to auscultation bilaterally Back: symmetric, no curvature. ROM normal. No CVA tenderness. Cardio: regular rate and rhythm, S1, S2 normal, no murmur, click, rub or gallop GI: soft, non-tender; bowel sounds normal; no masses,  no organomegaly Extremities: extremities normal, atraumatic, no cyanosis or edema Neurologic: Alert and oriented X 3, normal strength and tone. Normal symmetric reflexes. Normal coordination and gait  ECOG PERFORMANCE STATUS: 1 - Symptomatic but completely ambulatory  Blood pressure 105/65, pulse 79, temperature 98.5 F (36.9 C), temperature source Oral, resp. rate 17, height '5\' 7"'$  (1.702 m), weight 155 lb 3.2 oz (70.398 kg), SpO2 98 %.  LABORATORY DATA: Lab Results  Component Value Date   WBC 6.3 10/06/2015   HGB 12.2* 10/06/2015   HCT 39.7 10/06/2015   MCV 95.4 10/06/2015   PLT 173 10/06/2015      Chemistry      Component Value Date/Time   NA 139 09/03/2015 1151   NA 140 06/24/2012 0500   NA 144 04/23/2012 1324   K 4.4 09/03/2015 1151   K 4.2 06/24/2012 0500   K 4.4 04/23/2012 1324   CL 105 06/24/2012 0500   CL 108* 04/23/2012 1324   CO2 23 09/03/2015 1151   CO2 26 06/24/2012 0500   CO2 31 04/23/2012 1324   BUN 19.4 09/03/2015 1151   BUN 14 06/24/2012 0500   BUN 18  04/23/2012 1324   CREATININE 0.8 09/03/2015 1151   CREATININE 0.80 06/24/2012 0500   CREATININE 1.03 04/23/2012 1324      Component Value Date/Time   CALCIUM 8.8 09/03/2015 1151   CALCIUM 8.5 06/24/2012 0500   CALCIUM 8.4* 04/23/2012 1324   ALKPHOS 68 09/03/2015 1151   ALKPHOS 47 11/18/2007 0840   AST 21 09/03/2015 1151   AST 21 11/18/2007 0840   ALT 26 09/03/2015 1151   ALT 19 11/18/2007 0840   BILITOT 0.58 09/03/2015 1151   BILITOT 0.4 11/18/2007 0840       RADIOGRAPHIC STUDIES: Ct Head Wo Contrast  09/22/2015  CLINICAL DATA:  Lung cancer.  CVA 6 years ago with slurred speech. EXAM: CT HEAD WITHOUT CONTRAST TECHNIQUE: Contiguous axial images were obtained from the base of the skull through the vertex without intravenous contrast. COMPARISON:  MRI brain 04/29/2007. FINDINGS: A remote lacunar infarct is again noted within the left lentiform nucleus. Moderate generalized atrophy and diffuse white matter disease is otherwise similar to the prior exam. White matter changes extend into the brainstem. The cerebellum is unremarkable.  The ventricles are proportionate to the degree of atrophy. No significant extra-axial fluid collection is present. No definite mass lesion is seen. There is no acute infarct or hemorrhage. A polyp or mucous retention cyst is present medially within the left maxillary sinus. The remaining paranasal sinuses and the mastoid air cells are clear. The calvarium is intact. No significant extra-axial fluid collection is present. Bilateral lens replacements are noted. The globes and orbits are otherwise within normal limits. IMPRESSION: 1. No acute intracranial abnormality. 2. Remote lacunar infarct of the left lentiform nucleus. 3. Moderate generalized atrophy and white matter disease is present otherwise. This likely reflects the sequela of chronic microvascular ischemia. Electronically Signed   By: San Morelle M.D.   On: 09/22/2015 15:56   Nm Pet Image Initial (pi)  Skull Base To Thigh  09/11/2015  CLINICAL DATA:  Initial treatment strategy for lung mass. EXAM: NUCLEAR MEDICINE PET SKULL BASE TO THIGH TECHNIQUE: 7.6 mCi F-18 FDG was injected intravenously. Full-ring PET imaging was performed from the skull base to thigh after the radiotracer. CT data was obtained and used for attenuation correction and anatomic localization. FASTING BLOOD GLUCOSE:  Value: 103 mg/dl COMPARISON:  CT 08/21/2015 FINDINGS: NECK No hypermetabolic lymph nodes in the neck. CHEST RIGHT upper lobe mass measures 6.5 x 5.2 cm with SUV max equal 36. Several sub 5 mm nodules in the RIGHT upper lobe without metabolic activity. 4 mm nodule on image 82, series 4 and 3 mm nodule on image 81. No hypermetabolic mediastinal lymph nodes. ABDOMEN/PELVIS Low-attenuation lesion in the dome the liver without metabolic activity consists with a benign cyst. Normal adrenal glands. No hypermetabolic abdominal or pelvic lymph nodes. Normal pancreas spleen. Low-density lesions in the kidneys are likely benign cysts. Bowel is unremarkable. SKELETON No aggressive osseous lesion. IMPRESSION: 1. Large hypermetabolic 6.5 cm mass in the RIGHT upper lobe consistent with primary bronchogenic carcinoma. 2. No evidence of mediastinal nodal metastasis. 3. No evidence distant metastasis. 4. Small sub 5 mm pulmonary nodules in the RIGHT upper lobe are indeterminate. Subpleural locations are typically benign. Electronically Signed   By: Suzy Bouchard M.D.   On: 09/11/2015 14:41    ASSESSMENT AND PLAN: This is a very pleasant 82 years old white male with questionably stage II a non-small cell lung cancer presenting with large right upper lobe lung mass. The patient refuses tissue biopsy. He is also noted good candidate for surgical resection. I had a lengthy discussion with the patient today about his current condition and treatment options. I referred the patient to see Dr. Tammi Klippel for evaluation and consideration of curative  course of radiotherapy to the large right upper lobe lung mass. I'm not sure of the patient would be able to handle concurrent chemotherapy with the radiation at this point. Dr. Tammi Klippel will follow the patient after his radiotherapy and I'll be happy to see him in the future if needed. The patient agreed to the current plan. He was advised to call immediately if he has any concerning symptoms in the interval. The patient voices understanding of current disease status and treatment options and is in agreement with the current care plan.  All questions were answered. The patient knows to call the clinic with any problems, questions or concerns. We can certainly see the patient much sooner if necessary.  I spent 15 minutes counseling the patient face to face. The total time spent in the appointment was 25 minutes.  Disclaimer: This note was dictated with voice recognition software.  Similar sounding words can inadvertently be transcribed and may not be corrected upon review.

## 2015-10-08 NOTE — Progress Notes (Signed)
[place] Clinical Social Work  Clinical Social Work met with patient/family at [employer] appointment to offer support and assess for psychosocial needs.  Bradley Nixon was accompanied by his friend, Bradley Nixon.  Bradley Nixon lives with his spouse, who is also 82 years old.  The patient completes the cooking and most of the housekeeping.  The patient's son lives in [place] and daughter lives locally (?) but is unable to provide practical support due to recovering from a surgery.  CSW contacted [employer] [facility] to refer patient and patient spouse for [employer] program.  Bradley Nixon expressed feeling confident in his treatment plan and was appreciative of CSW support today.    ONCBCN DISTRESS SCREENING 10/08/2015  Screening Type Initial Screening  Distress experienced in past week (1-10) 5  Practical problem type Transportation;Food  Family Problem type Partner  Emotional problem type Adjusting to illness  Physical Problem type   Referral to clinical social work Yes     Clinical Social Work briefly discussed Bradley Nixon Strip Work role and [employer] support programs/services.  Clinical Social Work encouraged patient to call with any additional questions or concerns.   Polo Riley, MSW, LCSW, OSW-C Clinical Social Worker [facility] [phone]

## 2015-10-13 ENCOUNTER — Ambulatory Visit: Payer: Medicare Other

## 2015-10-14 ENCOUNTER — Telehealth: Payer: Self-pay

## 2015-10-14 NOTE — Telephone Encounter (Signed)
Faxed signed pacemaker form back to [facility] to [place], [place] at [phone].

## 2015-10-15 ENCOUNTER — Encounter: Payer: Self-pay | Admitting: Cardiovascular Disease

## 2015-10-15 ENCOUNTER — Ambulatory Visit (INDEPENDENT_AMBULATORY_CARE_PROVIDER_SITE_OTHER): Payer: Medicare Other | Admitting: Pharmacist Clinician (PhC)/ Clinical Pharmacy Specialist

## 2015-10-15 ENCOUNTER — Ambulatory Visit (INDEPENDENT_AMBULATORY_CARE_PROVIDER_SITE_OTHER): Payer: Medicare Other | Admitting: Cardiovascular Disease

## 2015-10-15 ENCOUNTER — Telehealth: Payer: Self-pay | Admitting: *Deleted

## 2015-10-15 VITALS — BP 105/66 | HR 79 | Ht 68.0 in | Wt 152.0 lb

## 2015-10-15 DIAGNOSIS — Z7901 Long term (current) use of anticoagulants: Secondary | ICD-10-CM

## 2015-10-15 DIAGNOSIS — R918 Other nonspecific abnormal finding of lung field: Secondary | ICD-10-CM | POA: Diagnosis not present

## 2015-10-15 DIAGNOSIS — Z950 Presence of cardiac pacemaker: Secondary | ICD-10-CM | POA: Diagnosis not present

## 2015-10-15 DIAGNOSIS — I4891 Unspecified atrial fibrillation: Secondary | ICD-10-CM

## 2015-10-15 LAB — POCT INR: INR: 1.4

## 2015-10-15 NOTE — Progress Notes (Signed)
Patient ID: Bradley Nixon, male   DOB: Nov 30, 1920, 82 y.o.   MRN: [id number]    Cardiology Office Note    Date:  10/16/2015   ID:  Bradley Nixon, DOB 15-May-1921, MRN [id number]  PCP:  Bradley Jacobson, MD  Cardiologist:   Bradley Klein, MD   Chief Complaint  Patient presents with  . Follow-up    History of Present Illness:  Bradley Nixon is a 82 y.o. male with permanent atrial fibrillation and remote history of transient ischemic attack who returns for pacemaker check. He is on chronic warfarin anticoagulation. His pacemaker is approaching elective replacement indicator (anticipated 0.5-1.75 years generator longevity). His dual-chamber [employer]. Jude Zephyr was implanted initially for sinus node dysfunction but he is now n permanent atrial fibrillation and requires pacing only 3% of the time. Rate control is appropriate.  He has been diagnosed with a large right lung mass, hypermetabolic on PET scan, but without evidence of mediastinal or distant metastasis. He is scheduled to meet with Dr. Tammi Nixon on Monday to discuss initiation of radiation therapy. A biopsy was planned, subsequently canceled since it was felt that the risks of complications out weight the benefit of exact tissue diagnosis. He has lost quite a bit of weight, 16 pounds over the last year.  Bradley Nixon and Bradley Nixon will be celebrating their 71st wedding anniversary tomorrow.  Roughly 15 years ago when he had complete expressive aphasia and right-sided hemiparesis for about 12 hours. His neurological symptoms resolved spontaneously.  He had normal coronary arteries by angiography in 2009.  Echo in June 2015 showed EF 55-60%, dilated LA.  Past Medical History  Diagnosis Date  . Coronary artery disease   . Myocardial infarction ([place])   . Anginal pain ([place])   . Hypothyroidism   . Shortness of breath   . Pacemaker 05/02/2007    St.Jude  . Pneumonia   . Cancer ([place])   . Arthritis   . Complication of anesthesia hard to arouse after  anesthesia  . Permanent atrial fibrillation [facility])     Past Surgical History  Procedure Laterality Date  . Cardioversion  07/28/2011    Procedure: CARDIOVERSION;  Surgeon: Bradley Nixon;  Location[facility] OR;  Service: Cardiovascular;  Laterality: N/A;  . Joint replacement    . Permanent pacemaker insertion  05/02/2007    [employer].Jude  . US echocardiography  09/17/2010    LA moderately dilated,mild TR, EF >55%  . Nm myocar perf wall motion  02/09/2010    no ischemia    Outpatient Prescriptions Prior to Visit  Medication Sig Dispense Refill  . atorvastatin (LIPITOR) 10 MG tablet TAKE 1 TABLET EVERY EVENING 90 tablet 0  . DimenhyDRINATE (DRAMAMINE PO) Take 1 tablet by mouth as needed. Reported on 09/03/2015    . docusate sodium 100 MG CAPS Take 100 mg by mouth 2 (two) times daily. 20 capsule 0  . finasteride (PROSCAR) 5 MG tablet Take 5 mg by mouth every evening.      . fish oil-omega-3 fatty acids 1000 MG capsule Take 1 g by mouth every evening.      . Lansoprazole (PREVACID PO) Take by mouth daily. Reported on 09/03/2015    . levothyroxine (SYNTHROID, LEVOTHROID) 50 MCG tablet Take 1 tablet (50 mcg total) by mouth daily. 30 tablet 0  . meclizine (ANTIVERT) 25 MG tablet Reported on 09/03/2015  99  . Multiple Vitamin (MULITIVITAMIN WITH MINERALS) TABS Take 1 tablet by mouth every evening.      Marland Kitchen  Tamsulosin HCl (FLOMAX) 0.4 MG CAPS Take 0.4 mg by mouth every evening.      . traMADol (ULTRAM) 50 MG tablet Take 50 mg by mouth every 8 (eight) hours as needed.    . warfarin (COUMADIN) 5 MG tablet TAKE 1 TO 1 AND 1/2 TABLETS  DAILY OR AS DIRECTED BY COUMADIN CLINIC 135 tablet 1   No facility-administered medications prior to visit.     Allergies:   Iodinated diagnostic agents and Zocor   Social History   Social History  . Marital Status: Married    Spouse Name: N/A  . Number of Children: N/A  . Years of Education: N/A   Social History Main Topics  . Smoking status: Never Smoker   . Smokeless  tobacco: None  . Alcohol Use: No  . Drug Use: No  . Sexual Activity: Not Currently   Other Topics Concern  . None   Social History Narrative     Family History:  The patient's family history includes Heart attack in his father; Heart failure in his mother.   ROS:   Please see the history of present illness.    ROS All other systems reviewed and are negative.   PHYSICAL EXAM:   VS:  BP 105/66 mmHg  Pulse 79  Ht '5\' 8"'$  (1.727 m)  Wt 68.947 kg (152 lb)  BMI 23.12 kg/m2   GEN: Well nourished, well developed, in no acute distress HEENT: normal Neck: no JVD, carotid bruits, or masses Cardiac: Irregular rhythm, wide split second heart sound; no murmurs, rubs, or gallops,no edema  Respiratory:  clear to auscultation bilaterally, normal work of breathing. Healthy subclavian pacemaker site GI: soft, nontender, nondistended, + BS MS: no deformity or atrophy Skin: warm and dry, no rash Neuro:  Alert and Oriented x 3, Strength and sensation are intact Psych: euthymic mood, full affect  Wt Readings from Last 3 Encounters:  10/15/15 68.947 kg (152 lb)  10/08/15 70.398 kg (155 lb 3.2 oz)  10/06/15 68.04 kg (150 lb)      Studies/Labs Reviewed:   EKG:  EKG is ordered today.  The ekg ordered today demonstrates atrial fibrillation and right bundle branch block. No ventricular pacing is seen  Recent Labs: 09/03/2015: ALT 26; BUN 19.4; Creatinine 0.8; Potassium 4.4; Sodium 139 10/06/2015: Hemoglobin 12.2*; Platelets 173   Lipid Panel    Component Value Date/Time   CHOL  11/18/2007 0930    135        ATP III CLASSIFICATION:  <200     mg/dL   Desirable  200-239  mg/dL   Borderline High  >=240    mg/dL   High   TRIG 102 11/18/2007 0930   HDL 38* 11/18/2007 0930   CHOLHDL 3.6 11/18/2007 0930   VLDL 20 11/18/2007 0930   LDLCALC  11/18/2007 0930    77        Total Cholesterol/HDL:CHD Risk Coronary Heart Disease Risk Table                     Men   Women  1/2 Average Risk   3.4    3.3    Additional studies/ records that were reviewed today include:  Notes from Dr. Servando Snare and the oncology clinic    ASSESSMENT:    1. Atrial fibrillation, unspecified type ([place])   2. Long term current use of anticoagulant therapy   3. Pacemaker   4. Lung mass      PLAN:  In order  of problems listed above:  1. AFib: Appropriate rate control without any AV nodal blocking agents. On warfarin anticoagulation. 2. Warfarin: No bleeding complications, no recent falls 3. PM: I don't think that T[place]ek necessarily needs a pacemaker anymore. The device was implanted for sinus node dysfunction and he now has permanent atrial fibrillation. He does have right bundle branch block and relatively slow AV conduction, but there has never been evidence of significant high-grade AV block At most I would recommend a generator change out, even though his ventricular pacing threshold is a little high. The new diagnosis of lung neoplasm significantly alters his long-term prognosis. 4. It is possible, but unlikely that radiation therapy delivered to his right chest will interfere with his right subclavian pacemaker function. He is not pacemaker dependent. On the other hand, may have to contend with skin healing issues if he needs a generator change out. Personally, I would advocate watchful waiting. I suspect he will do fine without pacemaker therapy    Medication Adjustments/Labs and Tests Ordered: Current medicines are reviewed at length with the patient today.  Concerns regarding medicines are outlined above.  Medication changes, Labs and Tests ordered today are listed in the Patient Instructions below. Patient Instructions  Your physician recommends that you continue on your current medications as directed. Please refer to the Current Medication list given to you today.  Dr Sallyanne Kuster recommends that you schedule a follow-up appointment in 6 months. You will receive a reminder letter in the mail two  months in advance. If you don't receive a letter, please call our office to schedule the follow-up appointment.  If you need a refill on your cardiac medications before your next appointment, please call your pharmacy.      Mikael Spray, MD  10/16/2015 1:07 PM    [facility] [place], [place], [place]  [address] Phone: [phone]; Fax: [phone]

## 2015-10-15 NOTE — Patient Instructions (Signed)
Your physician recommends that you continue on your current medications as directed. Please refer to the Current Medication list given to you today.  Dr Sallyanne Kuster recommends that you schedule a follow-up appointment in 6 months. You will receive a reminder letter in the mail two months in advance. If you don't receive a letter, please call our office to schedule the follow-up appointment.  If you need a refill on your cardiac medications before your next appointment, please call your pharmacy.

## 2015-10-15 NOTE — Telephone Encounter (Signed)
Oncology Nurse Navigator Documentation  Oncology Nurse Navigator Flowsheets 10/15/2015  Navigator Encounter Type Telephone  Telephone Outgoing Call  Treatment Phase Pre-Tx/Tx Discussion  Barriers/Navigation Needs Education  Coordination of Care Appts  Education Method Verbal  Acuity Level 1  Time Spent with Patient 15   I called to follow up with Bradley Nixon today.  I reminded him of his appt on 10/19/15 with Dr. Tammi Klippel.  He stated he was confused and thought the appt was on 3/28.  He was thankful for the call and clarification.

## 2015-10-19 ENCOUNTER — Ambulatory Visit
Admission: RE | Admit: 2015-10-19 | Discharge: 2015-10-19 | Disposition: A | Discharge status: Home / Self Care | Payer: Medicare Other | Source: Ambulatory Visit | Attending: Radiation Oncology | Admitting: Radiation Oncology

## 2015-10-19 ENCOUNTER — Ambulatory Visit
Admission: RE | Admit: 2015-10-19 | Discharge: 2015-10-19 | Disposition: A | Discharge status: Home / Self Care | Payer: Medicare Other | Source: Ambulatory Visit | Attending: Internal Medicine | Admitting: Internal Medicine

## 2015-10-19 VITALS — BP 93/58 | HR 105 | Resp 16 | Ht 68.0 in | Wt 151.3 lb

## 2015-10-19 DIAGNOSIS — Z510 Encounter for antineoplastic radiation therapy: Secondary | ICD-10-CM | POA: Diagnosis not present

## 2015-10-19 DIAGNOSIS — C3411 Malignant neoplasm of upper lobe, right bronchus or lung: Secondary | ICD-10-CM | POA: Insufficient documentation

## 2015-10-19 DIAGNOSIS — Z515 Encounter for palliative care: Secondary | ICD-10-CM

## 2015-10-19 DIAGNOSIS — R918 Other nonspecific abnormal finding of lung field: Secondary | ICD-10-CM

## 2015-10-19 DIAGNOSIS — Z66 Do not resuscitate: Secondary | ICD-10-CM | POA: Diagnosis not present

## 2015-10-19 NOTE — Progress Notes (Signed)
Weight stable. BP low. Reports Dr. Sallyanne Kuster is aware. Patient understands from Dr. Sallyanne Kuster he is to increase his protein intake and weight to raise his bp. Confirms he does have a pacemaker. Complete pacemaker form delivered to [place], RT in CT/SIM. Denies hx of xrt. Married for 71 years. 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 Reports he doesn't see or hear well.

## 2015-10-19 NOTE — Progress Notes (Signed)
  Radiation Oncology         [phone] ________________________________  Name: Bradley Nixon MRN: [id number]  Date: 10/19/2015  DOB: January 10, 1921   STEREOTACTIC BODY RADIOTHERAPY SIMULATION AND TREATMENT PLANNING NOTE    ICD-9-CM ICD-10-CM   1. Cancer of upper lobe of right lung (HCC) 162.3 C34.11     DIAGNOSIS:82 y.o. gentleman with stage IIA (T2b, N0, M0) non-small cell lung cancer presented with large right upper lung cancer.  NARRATIVE:  The patient was brought to the [facility].  Identity was confirmed.  All relevant records and images related to the planned course of therapy were reviewed.  The patient freely provided informed written consent to proceed with treatment after reviewing the details related to the planned course of therapy. The consent form was witnessed and verified by the simulation staff.  Then, the patient was set-up in a stable reproducible  supine position for radiation therapy.  A BodyFix immobilization pillow was fabricated for reproducible positioning.  Then I personally applied the abdominal compression paddle to limit respiratory excursion.  4D respiratoy motion management CT images were obtained.  Surface markings were placed.  The CT images were loaded into the planning software.  Then, using Cine, MIP, and standard views, the internal target volume (ITV) and planning target volumes (PTV) were delinieated, and avoidance structures were contoured.  Treatment planning then occurred.  The radiation prescription was entered and confirmed.  A total of two complex treatment devices were fabricated in the form of the BodyFix immobilization pillow and a neck accuform cushion.  I have requested : 3D Simulation  I have requested a DVH of the following structures: Heart, Lungs, Esophagus, Chest Wall, Brachial Plexus, Major Blood Vessels, and targets.  SPECIAL TREATMENT PROCEDURE:  The planned course of therapy using radiation constitutes a special treatment  procedure. Special care is required in the management of this patient for the following reasons. This treatment constitutes a Special Treatment Procedure for the following reason: [ High dose per fraction requiring special monitoring for increased toxicities of treatment including daily imaging..  The special nature of the planned course of radiotherapy will require increased physician supervision and oversight to ensure patient's safety with optimal treatment outcomes.  RESPIRATORY MOTION MANAGEMENT SIMULATION:  In order to account for effect of respiratory motion on target structures and other organs in the planning and delivery of radiotherapy, this patient underwent respiratory motion management simulation.  To accomplish this, when the patient was brought to the CT simulation planning suite, 4D respiratoy motion management CT images were obtained.  The CT images were loaded into the planning software.  Then, using a variety of tools including Cine, MIP, and standard views, the target volume and planning target volumes (PTV) were delineated.  Avoidance structures were contoured.  Treatment planning then occurred.  Dose volume histograms were generated and reviewed for each of the requested structure.  The resulting plan was carefully reviewed and approved today.  PLAN:  The patient will receive 50 Gy in 5 fractions.  ________________________________  Sheral Apley Tammi Klippel, M.D.  This document serves as a record of services personally performed by Tyler Pita, MD. It was created on his behalf by Darcus Austin, a trained medical scribe. The creation of this record is based on the scribe's personal observations and the provider's statements to them. This document has been checked and approved by the attending provider.

## 2015-10-19 NOTE — Consult Note (Signed)
Consultation Note Date: 10/19/2015   Patient Name: Bradley Nixon  DOB: 07/18/1921  MRN: [id number]  Age / Sex: 82 y.o., male  PCP: Lorene Dy, MD Referring Physician: Knox Royalty, NP  Reason for Consultation: Establishing goals of care, Non pain symptom management, Pain control and Psychosocial/spiritual support, Introduction of Palliative Medicine into a holistic care plan   Clinical Assessment/Narrative:  82 y.o. gentleman with probable stage IIA (T2b, N0, M0) non-small cell lung cancer presented with large right upper lobe lung mass diagnosed in January 2017.  No tissue biopsy.     Chest x-ray on 08/18/2015 showed large right upper lobe lung mass worrisome for a neoplasm. This was followed by CT scan of the chest without contrast on 08/21/2015 identified a lobulated mass arising in the posterior segment of the right upper lobe measuring 6.5 x 5.4 x 5.2 cm. There were several small nodular opacities in the lungs measuring between 2 and 4 mm. Etiology of the small nodular opacities is uncertain. There was no demonstrable adenopathy. PET scan on 09/11/15 showed a large hypermetabolic 6.5 cm mass in the right upper lobe consistent with primary bronchogenic carcinoma and small sub 5 mm pulmonary nodules in the right upper lobe.   There was no evidence of mediastinal nodal or distal metastases. The patient presented to Dr. Servando Snare and Dr. Anselm Pancoast to discuss biopsy of the right lung mass, but after consideration on the part of his physicians, himself, and his family the decision was made to forgo biopsy due to risks of the procedure.   Patient is current set for SBRT therapy/ 5 fractions. Simulation today for radiation treatment   Patient is faced with advanced directive decisions and anticipatory care needs  Multiple social concerns for this patient.  He is the main caregiver for his elderly wife.  Patient himself is frail  and declining.  Family tell me they have been encouraging the parents to move to a more assisted living situation without success, for many years.  Referral to [facility], they will be able to convert him to hospice services when eligible.   This NP Wadie Lessen reviewed medical records, received report from team, assessed the patient and then meet with Mr Kalas and his friend Jake Samples  # [phone] in  the outpatient radiation-oncology clinic.  Today's conversation was then detailed with Pete Glatter Jr/son by telephone   A  discussion was had today regarding advanced directives.  Concepts specific to code status, artifical feeding and hydration was had.  The difference between a aggressive medical intervention path  and a palliative comfort care path for this patient at this time was had.  Values and goals of care important to patient and family were attempted to be elicited.  Concept of Hospice and Palliative Care were discussed.  Questions and concerns addressed.  Patient and family encouraged to call with questions or concerns.     PMH:  Past Medical History  Diagnosis Date  . Coronary artery disease   . Myocardial infarction ([place])   . Anginal pain ([place])   . Hypothyroidism   . Shortness of breath   . Pacemaker 05/02/2007    [facility]  . Pneumonia   . Cancer ([place])   . Arthritis   . Complication of anesthesia hard to arouse after anesthesia  . Permanent atrial fibrillation (HCC)      PSH: Past Surgical History  Procedure Laterality Date  . Cardioversion  07/28/2011    Procedure: CARDIOVERSION;  Surgeon: Leonie Green  Ellyn Hack;  Location: [facility] OR;  Service: Cardiovascular;  Laterality: N/A;  . Joint replacement    . Permanent pacemaker insertion  05/02/2007    [facility]  . US echocardiography  09/17/2010    LA moderately dilated,mild TR, EF >55%  . Nm myocar perf wall motion  02/09/2010    no ischemia   I have reviewed the [place] and [facility] and  If appropriate update it  with new information. Allergies  Allergen Reactions  . Iodinated Diagnostic Agents Shortness Of Breath    SOB NAUSEA AND VOMITING  . Zocor [Simvastatin]     Weakness    Scheduled Meds: Continuous Infusions: PRN Meds:.    There were no vitals taken for this visit.    No intake or output data in the 24 hours ending 10/19/15 1410   Physical Exam:  General: frail, elderly male, NAD, in wheelchair HEENT:  Poor dentation  Labs: CBC    Component Value Date/Time   WBC 6.3 10/06/2015 1010   WBC 6.7 09/03/2015 1151   WBC 7.0 04/23/2012 1324   RBC 4.16* 10/06/2015 1010   RBC 4.10* 09/03/2015 1151   RBC 4.02* 04/23/2012 1324   HGB 12.2* 10/06/2015 1010   HGB 12.5* 09/03/2015 1151   HGB 13.0 04/23/2012 1324   HCT 39.7 10/06/2015 1010   HCT 38.0* 09/03/2015 1151   HCT 38.6* 04/23/2012 1324   PLT 173 10/06/2015 1010   PLT 198 09/03/2015 1151   PLT 189 04/23/2012 1324   MCV 95.4 10/06/2015 1010   MCV 92.6 09/03/2015 1151   MCV 96 04/23/2012 1324   MCH 29.3 10/06/2015 1010   MCH 30.4 09/03/2015 1151   MCH 32.4 04/23/2012 1324   MCHC 30.7 10/06/2015 1010   MCHC 32.8 09/03/2015 1151   MCHC 33.7 04/23/2012 1324   RDW 14.0 10/06/2015 1010   RDW 14.7* 09/03/2015 1151   RDW 15.4* 04/23/2012 1324   LYMPHSABS 1.7 09/03/2015 1151   LYMPHSABS 2.0 06/23/2012 1558   LYMPHSABS 2.1 04/23/2012 1324   MONOABS 0.8 09/03/2015 1151   MONOABS 1.0 06/23/2012 1558   MONOABS 0.7 04/23/2012 1324   EOSABS 0.1 09/03/2015 1151   EOSABS 0.1 06/23/2012 1558   EOSABS 0.1 04/23/2012 1324   BASOSABS 0.0 09/03/2015 1151   BASOSABS 0.0 06/23/2012 1558   BASOSABS 0.0 04/23/2012 1324    BMET    Component Value Date/Time   NA 139 09/03/2015 1151   NA 140 06/24/2012 0500   NA 144 04/23/2012 1324   K 4.4 09/03/2015 1151   K 4.2 06/24/2012 0500   K 4.4 04/23/2012 1324   CL 105 06/24/2012 0500   CL 108* 04/23/2012 1324   CO2 23 09/03/2015 1151   CO2 26 06/24/2012 0500   CO2 31 04/23/2012  1324   GLUCOSE 106 09/03/2015 1151   GLUCOSE 109* 06/24/2012 0500   GLUCOSE 97 04/23/2012 1324   BUN 19.4 09/03/2015 1151   BUN 14 06/24/2012 0500   BUN 18 04/23/2012 1324   CREATININE 0.8 09/03/2015 1151   CREATININE 0.80 06/24/2012 0500   CREATININE 1.03 04/23/2012 1324   CALCIUM 8.8 09/03/2015 1151   CALCIUM 8.5 06/24/2012 0500   CALCIUM 8.4* 04/23/2012 1324   GFRNONAA 77* 06/24/2012 0500   GFRNONAA >60 04/23/2012 1324   GFRAA 89* 06/24/2012 0500   GFRAA >60 04/23/2012 1324    CMP     Component Value Date/Time   NA 139 09/03/2015 1151   NA 140 06/24/2012 0500   NA 144 04/23/2012 1324  K 4.4 09/03/2015 1151   K 4.2 06/24/2012 0500   K 4.4 04/23/2012 1324   CL 105 06/24/2012 0500   CL 108* 04/23/2012 1324   CO2 23 09/03/2015 1151   CO2 26 06/24/2012 0500   CO2 31 04/23/2012 1324   GLUCOSE 106 09/03/2015 1151   GLUCOSE 109* 06/24/2012 0500   GLUCOSE 97 04/23/2012 1324   BUN 19.4 09/03/2015 1151   BUN 14 06/24/2012 0500   BUN 18 04/23/2012 1324   CREATININE 0.8 09/03/2015 1151   CREATININE 0.80 06/24/2012 0500   CREATININE 1.03 04/23/2012 1324   CALCIUM 8.8 09/03/2015 1151   CALCIUM 8.5 06/24/2012 0500   CALCIUM 8.4* 04/23/2012 1324   PROT 7.1 09/03/2015 1151   PROT 6.3 11/18/2007 0840   ALBUMIN 3.4* 09/03/2015 1151   ALBUMIN 3.1* 11/18/2007 0840   AST 21 09/03/2015 1151   AST 21 11/18/2007 0840   ALT 26 09/03/2015 1151   ALT 19 11/18/2007 0840   ALKPHOS 68 09/03/2015 1151   ALKPHOS 47 11/18/2007 0840   BILITOT 0.58 09/03/2015 1151   BILITOT 0.4 11/18/2007 0840   GFRNONAA 77* 06/24/2012 0500   GFRNONAA >60 04/23/2012 1324   GFRAA 89* 06/24/2012 0500   GFRAA >60 04/23/2012 1324    SUMMARY OF RECOMMENDATIONS  Code Status/Advance Care Planning: DNR--documented today Code Status History    Date Active Date Inactive Code Status Order ID Comments User Context   06/24/2012  2:33 AM 06/26/2012  5:48 PM Full Code [id number]  Etta Quill, DO ED       Other Directives:None    Additional Recommendations (Limitations, Scope, Preferences):  Avoid Hospitalization and No Artificial Feeding   Psycho-social/Spiritual:  Support System: Poor  Additional Recommendations: Education on Hospice and Referral to [employer]   -will make referral to [facility] services  Prognosis: Unable to determine, impacted by desire for life prolonging therapies   Chief Complaint/ Primary Diagnoses: Present on Admission:  **None**  I have reviewed the medical record, interviewed the patient and family, and examined the patient. The following aspects are pertinent.  Past Medical History  Diagnosis Date  . Coronary artery disease   . Myocardial infarction ([place])   . Anginal pain ([place])   . Hypothyroidism   . Shortness of breath   . Pacemaker 05/02/2007    [facility]  . Pneumonia   . Cancer ([place])   . Arthritis   . Complication of anesthesia hard to arouse after anesthesia  . Permanent atrial fibrillation [facility])    Social History   Social History  . Marital Status: Married    Spouse Name: N/A  . Number of Children: N/A  . Years of Education: N/A   Social History Main Topics  . Smoking status: Never Smoker   . Smokeless tobacco: Not on file  . Alcohol Use: No  . Drug Use: No  . Sexual Activity: Not Currently   Other Topics Concern  . Not on file   Social History Narrative   Family History  Problem Relation Age of Onset  . Heart failure Mother   . Heart attack Father    Scheduled Meds: Continuous Infusions: PRN Meds:. Medications Prior to Admission:  Prior to Admission medications   Medication Sig Start Date End Date Taking? Authorizing Provider  atorvastatin (LIPITOR) 10 MG tablet TAKE 1 TABLET EVERY EVENING 07/28/15   Mihai Croitoru, MD  DimenhyDRINATE (DRAMAMINE PO) Take 1 tablet by mouth as needed. Reported on 09/03/2015    Historical Provider, MD  docusate  sodium 100 MG CAPS Take 100 mg by mouth 2 (two) times daily.  06/26/12   Thurnell Lose, MD  finasteride (PROSCAR) 5 MG tablet Take 5 mg by mouth every evening.      Historical Provider, MD  fish oil-omega-3 fatty acids 1000 MG capsule Take 1 g by mouth every evening.      Historical Provider, MD  Lansoprazole (PREVACID PO) Take by mouth daily. Reported on 09/03/2015    Historical Provider, MD  levothyroxine (SYNTHROID, LEVOTHROID) 50 MCG tablet Take 1 tablet (50 mcg total) by mouth daily. 03/25/15   Mihai Croitoru, MD  meclizine (ANTIVERT) 25 MG tablet Reported on 09/03/2015 06/30/15   Historical Provider, MD  Multiple Vitamin (MULITIVITAMIN WITH MINERALS) TABS Take 1 tablet by mouth every evening.      Historical Provider, MD  Tamsulosin HCl (FLOMAX) 0.4 MG CAPS Take 0.4 mg by mouth every evening.      Historical Provider, MD  traMADol (ULTRAM) 50 MG tablet Take 50 mg by mouth every 8 (eight) hours as needed.    Historical Provider, MD  warfarin (COUMADIN) 5 MG tablet TAKE 1 TO 1 AND 1/2 TABLETS  DAILY OR AS DIRECTED BY COUMADIN CLINIC 07/28/15   Sanda Klein, MD   Allergies  Allergen Reactions  . Iodinated Diagnostic Agents Shortness Of Breath    SOB NAUSEA AND VOMITING  . Zocor [Simvastatin]     Weakness     Review of Systems  Constitutional: Positive for activity change, appetite change and fatigue.  Respiratory: Positive for shortness of breath.     Physical Exam  Constitutional: He is oriented to person, place, and time. He appears cachectic. He appears ill.  HENT:  Mouth/Throat: Abnormal dentition. Dental caries present. No oropharyngeal exudate.  Neurological: He is alert and oriented to person, place, and time.  Skin: Skin is warm and dry.    Vital Signs: There were no vitals taken for this visit.  SpO2:   O2 Device:  O2 Flow Rate: .   IO: Intake/output summary: No intake or output data in the 24 hours ending 10/19/15 1402  LBM:   Baseline Weight:   Most recent weight:        Palliative Assessment/Data:   ECOG PERFORMANCE  STATUS* (Eastern Cooperative Oncology Group)  0 Fully active, able to continue with all pre-disease activities without restriction. Pt score  1 Restricted in physically strenuous activity but ambulatory and able to carry out work of a light or sedentary nature, e.g., light house work, office work.   2 Ambulatory and capable of all self-care but unable to carry out any work activities. Up and about more than 50% of waking hours.  2  3 Capable of only limited self-care. Confined to bed or chair more than 50% of waking hours.   4 Completely disabled. Cannot carry on any self-care. Totally confined to bed or chair.   5 Dead.    As published in Am. J. Clin. Oncol.: Eustace Pen, M.M., Colon Flattery., Alanson, D.C., Horton, Sharen Hint., Drexel Iha, P.P.: Toxicity And Response Criteria Of The [facility]. [place] 9:485-462, 1982.  The ECOG Performance Status is in the public domain therefore available for public use. To duplicate the scale, please cite the reference above and credit the [facility] Group, Tyler Pita M.D., Group Chair  Additional Data Reviewed:  CBC:    Component Value Date/Time   WBC 6.3 10/06/2015 1010   WBC 6.7 09/03/2015 1151  WBC 7.0 04/23/2012 1324   HGB 12.2* 10/06/2015 1010   HGB 12.5* 09/03/2015 1151   HGB 13.0 04/23/2012 1324   HCT 39.7 10/06/2015 1010   HCT 38.0* 09/03/2015 1151   HCT 38.6* 04/23/2012 1324   PLT 173 10/06/2015 1010   PLT 198 09/03/2015 1151   PLT 189 04/23/2012 1324   MCV 95.4 10/06/2015 1010   MCV 92.6 09/03/2015 1151   MCV 96 04/23/2012 1324   NEUTROABS 4.1 09/03/2015 1151   NEUTROABS 8.0* 06/23/2012 1558   NEUTROABS 3.9 04/23/2012 1324   LYMPHSABS 1.7 09/03/2015 1151   LYMPHSABS 2.0 06/23/2012 1558   LYMPHSABS 2.1 04/23/2012 1324   MONOABS 0.8 09/03/2015 1151   MONOABS 1.0 06/23/2012 1558   MONOABS 0.7 04/23/2012 1324   EOSABS 0.1 09/03/2015 1151   EOSABS 0.1 06/23/2012 1558    EOSABS 0.1 04/23/2012 1324   BASOSABS 0.0 09/03/2015 1151   BASOSABS 0.0 06/23/2012 1558   BASOSABS 0.0 04/23/2012 1324   Comprehensive Metabolic Panel:    Component Value Date/Time   NA 139 09/03/2015 1151   NA 140 06/24/2012 0500   NA 144 04/23/2012 1324   K 4.4 09/03/2015 1151   K 4.2 06/24/2012 0500   K 4.4 04/23/2012 1324   CL 105 06/24/2012 0500   CL 108* 04/23/2012 1324   CO2 23 09/03/2015 1151   CO2 26 06/24/2012 0500   CO2 31 04/23/2012 1324   BUN 19.4 09/03/2015 1151   BUN 14 06/24/2012 0500   BUN 18 04/23/2012 1324   CREATININE 0.8 09/03/2015 1151   CREATININE 0.80 06/24/2012 0500   CREATININE 1.03 04/23/2012 1324   GLUCOSE 106 09/03/2015 1151   GLUCOSE 109* 06/24/2012 0500   GLUCOSE 97 04/23/2012 1324   CALCIUM 8.8 09/03/2015 1151   CALCIUM 8.5 06/24/2012 0500   CALCIUM 8.4* 04/23/2012 1324   AST 21 09/03/2015 1151   AST 21 11/18/2007 0840   ALT 26 09/03/2015 1151   ALT 19 11/18/2007 0840   ALKPHOS 68 09/03/2015 1151   ALKPHOS 47 11/18/2007 0840   BILITOT 0.58 09/03/2015 1151   BILITOT 0.4 11/18/2007 0840   PROT 7.1 09/03/2015 1151   PROT 6.3 11/18/2007 0840   ALBUMIN 3.4* 09/03/2015 1151   ALBUMIN 3.1* 11/18/2007 0840     Time In: 1230 Time Out: 1345 Time Total: 75 min Greater than 50%  of this time was spent counseling and coordinating care related to the above assessment and plan.  Signed by: Wadie Lessen, NP  Knox Royalty, NP  10/19/2015, 2:02 PM  Please contact Palliative Medicine Team phone at [phone] for questions and concerns.

## 2015-10-21 ENCOUNTER — Other Ambulatory Visit: Payer: Self-pay | Admitting: Radiation Oncology

## 2015-10-21 ENCOUNTER — Encounter: Payer: Self-pay | Admitting: Radiation Oncology

## 2015-10-21 ENCOUNTER — Other Ambulatory Visit (HOSPITAL_COMMUNITY): Payer: Self-pay | Admitting: Radiation Oncology

## 2015-10-21 DIAGNOSIS — C3411 Malignant neoplasm of upper lobe, right bronchus or lung: Secondary | ICD-10-CM

## 2015-10-21 DIAGNOSIS — R1310 Dysphagia, unspecified: Secondary | ICD-10-CM

## 2015-10-21 LAB — CUP PACEART INCLINIC DEVICE CHECK
Implantable Lead Implant Date: 20081008
Implantable Lead Location: 753860
MDC IDC LEAD IMPLANT DT: 20081008
MDC IDC LEAD LOCATION: 753859
MDC IDC PG SERIAL: 1984739
MDC IDC SESS DTM: 20170329134436

## 2015-10-21 NOTE — Progress Notes (Signed)
Patient is the sole caregiver for his wife of 84 years. He has a son who lives in [place] and a daughter that isn't local. His friend Mortimer Fries that lives to houses down assist him often and helps with transportation. Wadie Lessen, NP informed this RN today she has triggered in home palliative care with an initial visit scheduled for Friday, March 31st.

## 2015-10-22 ENCOUNTER — Ambulatory Visit (HOSPITAL_COMMUNITY): Admission: RE | Admit: 2015-10-22 | Payer: Medicare Other | Source: Ambulatory Visit

## 2015-10-22 ENCOUNTER — Other Ambulatory Visit (HOSPITAL_COMMUNITY): Payer: Medicare Other

## 2015-10-23 ENCOUNTER — Ambulatory Visit (HOSPITAL_COMMUNITY): Payer: Medicare Other

## 2015-10-27 ENCOUNTER — Ambulatory Visit (HOSPITAL_COMMUNITY)
Admission: RE | Admit: 2015-10-27 | Discharge: 2015-10-27 | Disposition: A | Discharge status: Home / Self Care | Payer: Medicare Other | Source: Ambulatory Visit | Attending: Radiation Oncology | Admitting: Radiation Oncology

## 2015-10-27 DIAGNOSIS — R1310 Dysphagia, unspecified: Secondary | ICD-10-CM

## 2015-10-27 DIAGNOSIS — C3411 Malignant neoplasm of upper lobe, right bronchus or lung: Secondary | ICD-10-CM | POA: Insufficient documentation

## 2015-10-29 ENCOUNTER — Ambulatory Visit: Payer: Medicare Other | Admitting: Radiation Oncology

## 2015-10-30 ENCOUNTER — Ambulatory Visit: Payer: Medicare Other | Admitting: Radiation Oncology

## 2015-10-30 DIAGNOSIS — R918 Other nonspecific abnormal finding of lung field: Secondary | ICD-10-CM | POA: Insufficient documentation

## 2015-11-02 ENCOUNTER — Ambulatory Visit
Admission: RE | Admit: 2015-11-02 | Discharge: 2015-11-02 | Disposition: A | Discharge status: Home / Self Care | Payer: Medicare Other | Source: Ambulatory Visit | Attending: Radiation Oncology | Admitting: Radiation Oncology

## 2015-11-02 ENCOUNTER — Ambulatory Visit: Payer: Medicare Other | Admitting: Radiation Oncology

## 2015-11-03 ENCOUNTER — Ambulatory Visit: Payer: Medicare Other | Admitting: Radiation Oncology

## 2015-11-03 ENCOUNTER — Encounter: Payer: Medicare Other | Admitting: Pharmacist Clinician (PhC)/ Clinical Pharmacy Specialist

## 2015-11-04 ENCOUNTER — Ambulatory Visit
Admission: RE | Admit: 2015-11-04 | Discharge: 2015-11-04 | Disposition: A | Discharge status: Home / Self Care | Payer: Medicare Other | Source: Ambulatory Visit | Attending: Radiation Oncology | Admitting: Radiation Oncology

## 2015-11-04 ENCOUNTER — Ambulatory Visit: Payer: Medicare Other | Admitting: Radiation Oncology

## 2015-11-05 ENCOUNTER — Ambulatory Visit: Payer: Medicare Other | Admitting: Radiation Oncology

## 2015-11-06 ENCOUNTER — Other Ambulatory Visit: Payer: Self-pay | Admitting: Cardiovascular Disease

## 2015-11-06 ENCOUNTER — Ambulatory Visit
Admission: RE | Admit: 2015-11-06 | Discharge: 2015-11-06 | Disposition: A | Discharge status: Home / Self Care | Payer: Medicare Other | Source: Ambulatory Visit | Attending: Radiation Oncology | Admitting: Radiation Oncology

## 2015-11-06 ENCOUNTER — Ambulatory Visit: Payer: Medicare Other | Admitting: Radiation Oncology

## 2015-11-06 NOTE — Telephone Encounter (Signed)
REFILL 

## 2015-11-09 ENCOUNTER — Ambulatory Visit: Payer: Medicare Other | Admitting: Radiation Oncology

## 2015-11-10 ENCOUNTER — Ambulatory Visit
Admission: RE | Admit: 2015-11-10 | Discharge: 2015-11-10 | Disposition: A | Discharge status: Home / Self Care | Payer: Medicare Other | Source: Ambulatory Visit | Attending: Radiation Oncology | Admitting: Radiation Oncology

## 2015-11-10 ENCOUNTER — Ambulatory Visit: Payer: Medicare Other

## 2015-11-12 ENCOUNTER — Ambulatory Visit: Payer: Medicare Other

## 2015-11-12 ENCOUNTER — Ambulatory Visit
Admission: RE | Admit: 2015-11-12 | Discharge: 2015-11-12 | Disposition: A | Discharge status: Home / Self Care | Payer: Medicare Other | Source: Ambulatory Visit | Attending: Radiation Oncology | Admitting: Radiation Oncology

## 2015-11-13 ENCOUNTER — Ambulatory Visit: Payer: Medicare Other | Admitting: Radiation Oncology

## 2015-11-16 ENCOUNTER — Ambulatory Visit: Payer: Medicare Other

## 2015-11-16 ENCOUNTER — Ambulatory Visit
Admission: RE | Admit: 2015-11-16 | Discharge: 2015-11-16 | Disposition: A | Discharge status: Home / Self Care | Payer: Medicare Other | Source: Ambulatory Visit | Attending: Radiation Oncology | Admitting: Radiation Oncology

## 2015-11-16 ENCOUNTER — Ambulatory Visit (HOSPITAL_BASED_OUTPATIENT_CLINIC_OR_DEPARTMENT_OTHER): Payer: Medicare Other | Admitting: Hematology & Oncology

## 2015-11-16 ENCOUNTER — Other Ambulatory Visit: Payer: Medicare Other

## 2015-11-16 ENCOUNTER — Encounter: Payer: Self-pay | Admitting: Hematology & Oncology

## 2015-11-16 ENCOUNTER — Ambulatory Visit: Payer: Medicare Other | Admitting: Radiation Oncology

## 2015-11-16 ENCOUNTER — Ambulatory Visit (HOSPITAL_BASED_OUTPATIENT_CLINIC_OR_DEPARTMENT_OTHER)
Admission: RE | Admit: 2015-11-16 | Discharge: 2015-11-16 | Disposition: A | Discharge status: Home / Self Care | Payer: Medicare Other | Source: Ambulatory Visit | Attending: Hematology & Oncology | Admitting: Hematology & Oncology

## 2015-11-16 VITALS — BP 105/55 | HR 67 | Temp 97.9°F | Resp 16 | Ht 68.0 in | Wt 150.0 lb

## 2015-11-16 DIAGNOSIS — C3411 Malignant neoplasm of upper lobe, right bronchus or lung: Secondary | ICD-10-CM

## 2015-11-16 DIAGNOSIS — Z950 Presence of cardiac pacemaker: Secondary | ICD-10-CM

## 2015-11-16 NOTE — Progress Notes (Signed)
Hematology and Oncology Follow Up Visit  Bradley Nixon [id number] March 10, 1921 82 y.o. 11/16/2015   Principle Diagnosis:   Right upper lobe bronchogenic carcinoma-type unknown as patient refuses biopsy  Current Therapy:    Observation     Interim History:  Bradley Nixon is in to see [place]. He wants a second opinion area and he's been seen previously by Dr. Earlie Server who provided, as always, incredible insight.  It was felt that Bradley Nixon we do well with stereotactic radiosurgery. However, he has not done this as he did not think that he could lay still for it.  He has had no cough. He's had no chest wall pain. He's had no fever. He's had no weight loss. His appetite is marginal.  We did go ahead and get another chest x-ray on this mass. I foresee, the masses grown quite a bit. It now measures 6.8 x 8.2 x 9.2 cm. I would have to think that this probably has almost doubled in size.  He comes in a wheelchair. I would think that his overall performance status is ECOG 3.  Medications:  Current outpatient prescriptions:  .  atorvastatin (LIPITOR) 10 MG tablet, TAKE 1 TABLET EVERY EVENING, Disp: 90 tablet, Rfl: 1 .  DimenhyDRINATE (DRAMAMINE PO), Take 1 tablet by mouth as needed. Reported on 09/03/2015, Disp: , Rfl:  .  docusate sodium 100 MG CAPS, Take 100 mg by mouth 2 (two) times daily., Disp: 20 capsule, Rfl: 0 .  finasteride (PROSCAR) 5 MG tablet, Take 5 mg by mouth every evening.  , Disp: , Rfl:  .  fish oil-omega-3 fatty acids 1000 MG capsule, Take 1 g by mouth every evening.  , Disp: , Rfl:  .  Lansoprazole (PREVACID PO), Take by mouth daily. Reported on 09/03/2015, Disp: , Rfl:  .  levothyroxine (SYNTHROID, LEVOTHROID) 50 MCG tablet, Take 1 tablet (50 mcg total) by mouth daily., Disp: 30 tablet, Rfl: 0 .  meclizine (ANTIVERT) 25 MG tablet, Reported on 09/03/2015, Disp: , Rfl: 99 .  Multiple Vitamin (MULITIVITAMIN WITH MINERALS) TABS, Take 1 tablet by mouth every evening.  , Disp: , Rfl:  .   Tamsulosin HCl (FLOMAX) 0.4 MG CAPS, Take 0.4 mg by mouth every evening.  , Disp: , Rfl:  .  traMADol (ULTRAM) 50 MG tablet, Take 50 mg by mouth every 8 (eight) hours as needed., Disp: , Rfl:  .  warfarin (COUMADIN) 5 MG tablet, TAKE 1 TO 1 AND 1/2 TABLETS  DAILY OR AS DIRECTED BY COUMADIN CLINIC, Disp: 135 tablet, Rfl: 1  Allergies:  Allergies  Allergen Reactions  . Iodinated Diagnostic Agents Shortness Of Breath    SOB NAUSEA AND VOMITING  . Zocor [Simvastatin]     Weakness     Past Medical History, Surgical history, Social history, and Family History were reviewed and updated.  Review of Systems: As above  Physical Exam:  height is '5\' 8"'$  (1.727 m) and weight is 150 lb (68.04 kg). His oral temperature is 97.9 F (36.6 C). His blood pressure is 105/55 and his pulse is 67. His respiration is 16.   Wt Readings from Last 3 Encounters:  11/16/15 150 lb (68.04 kg)  10/19/15 151 lb 4.8 oz (68.629 kg)  10/15/15 152 lb (68.947 kg)     Elderly, thin white male in no obvious distress. Head and neck exam shows no ocular or oral lesions. He has no palpable cervical or supraclavicular lymph nodes. Lungs are clear bilaterally. He has no rales, wheezes  or rhonchi. Cardiac exam regular rate and rhythm with no murmurs, rubs or bruits.  Abdomen is soft. There is no fluid wave. There is no palpable liver or spleen tip. Back exam shows no tenderness over the spine, ribs or hips. Extremities shows no clubbing, cyanosis or edema. He has muscle atrophy in upper and lower extremities. Neurological exam shows no focal neurological deficits. Lab Results  Component Value Date   WBC 6.3 10/06/2015   HGB 12.2* 10/06/2015   HCT 39.7 10/06/2015   MCV 95.4 10/06/2015   PLT 173 10/06/2015     Chemistry      Component Value Date/Time   NA 139 09/03/2015 1151   NA 140 06/24/2012 0500   NA 144 04/23/2012 1324   K 4.4 09/03/2015 1151   K 4.2 06/24/2012 0500   K 4.4 04/23/2012 1324   CL 105 06/24/2012  0500   CL 108* 04/23/2012 1324   CO2 23 09/03/2015 1151   CO2 26 06/24/2012 0500   CO2 31 04/23/2012 1324   BUN 19.4 09/03/2015 1151   BUN 14 06/24/2012 0500   BUN 18 04/23/2012 1324   CREATININE 0.8 09/03/2015 1151   CREATININE 0.80 06/24/2012 0500   CREATININE 1.03 04/23/2012 1324      Component Value Date/Time   CALCIUM 8.8 09/03/2015 1151   CALCIUM 8.5 06/24/2012 0500   CALCIUM 8.4* 04/23/2012 1324   ALKPHOS 68 09/03/2015 1151   ALKPHOS 47 11/18/2007 0840   AST 21 09/03/2015 1151   AST 21 11/18/2007 0840   ALT 26 09/03/2015 1151   ALT 19 11/18/2007 0840   BILITOT 0.58 09/03/2015 1151   BILITOT 0.4 11/18/2007 0840         Impression and Plan: Bradley Nixon is a 82 year old white male. He has a bronchogenic carcinoma in the right upper lung. I have to suspect this is a non-small cell lung cancer.  Unfortunately I think the tumor might have gotten to be to be treated by stereotactic radiosurgery. He has a pacemaker over on the right side of his chest wall.  In my opinion, he is not a candidate for systemic chemotherapy.  He does not want a biopsy. As such, we would not be able to get the PD-L1 levels if the tumor is positive for PD-L1.  His son was with [place].  Another option would be no treatment at all. I think if this is decided, he clearly will need hospice. At 82 years old, and the fact that he does not have the greatest performance status, doing nothing with it not be unreasonable.  I left a message for Dr. Tammi Klippel of radiation oncology. Maybe there can be something that could be done. I don't think the tumor is curable by any means now.  I spent about 45 minutes with Bradley Nixon and his son. I went over the chest x-ray. I explained the changes and what I felt was the proper therapy to consider.  I would like to see him back in one month.   Bradley Napoleon, MD 4/24/20175:07 PM

## 2015-11-17 ENCOUNTER — Ambulatory Visit: Payer: Medicare Other

## 2015-11-18 ENCOUNTER — Ambulatory Visit: Payer: Medicare Other | Admitting: Radiation Oncology

## 2015-11-20 ENCOUNTER — Ambulatory Visit: Payer: Medicare Other

## 2015-11-20 ENCOUNTER — Telehealth: Payer: Self-pay | Admitting: Radiation Oncology

## 2015-11-20 NOTE — Telephone Encounter (Signed)
Patient's son, Maclin Guerrette, left message requesting return call. Returned call promptly. Arizona Nordquist expressed confusion about his father's appointments showing on MyChart. Explained that several cancelled [facility] appointments were simply place holders while his father sought out a second opinion. Explained his father's next appointment is for 11/26/15 for a simulation. Went onto explain that another simulation was needed so that the patient would receive treatment in a more comfortable position. Erhardt Dada understands his father will not receive any radiation treatment on 5/4 but, will obtain his schedule for treatments following the simulation.

## 2015-11-23 ENCOUNTER — Ambulatory Visit: Payer: Medicare Other

## 2015-11-24 ENCOUNTER — Ambulatory Visit: Payer: Medicare Other

## 2015-11-26 ENCOUNTER — Ambulatory Visit
Admission: RE | Admit: 2015-11-26 | Discharge: 2015-11-26 | Disposition: A | Discharge status: Home / Self Care | Payer: Medicare Other | Source: Ambulatory Visit | Attending: Radiation Oncology | Admitting: Radiation Oncology

## 2015-11-26 ENCOUNTER — Encounter: Payer: Self-pay | Admitting: *Deleted

## 2015-11-26 DIAGNOSIS — Z510 Encounter for antineoplastic radiation therapy: Secondary | ICD-10-CM | POA: Diagnosis not present

## 2015-11-26 DIAGNOSIS — C3411 Malignant neoplasm of upper lobe, right bronchus or lung: Secondary | ICD-10-CM

## 2015-11-26 NOTE — Progress Notes (Signed)
  Radiation Oncology         [phone] ________________________________  Name: Bradley Nixon MRN: [id number]  Date: 11/26/2015  DOB: 01-03-1921  STEREOTACTIC BODY RADIOTHERAPY SIMULATION AND TREATMENT PLANNING NOTE    ICD-9-CM ICD-10-CM   1. Cancer of upper lobe of right lung (HCC) 162.3 C34.11     DIAGNOSIS:  82 y.o. gentleman with stage IIA (T2b, N0, M0) non-small cell lung cancer presented with large right upper lung cancer.  NARRATIVE:  The patient was brought to the [facility].  Identity was confirmed.  All relevant records and images related to the planned course of therapy were reviewed.  The patient freely provided informed written consent to proceed with treatment after reviewing the details related to the planned course of therapy. The consent form was witnessed and verified by the simulation staff.  Then, the patient was set-up in a stable reproducible  supine position for radiation therapy  His right arm was raised to move the pacemaker away from the tumor.  A BodyFix immobilization pillow was fabricated for reproducible positioning.  4D respiratoy motion management CT images were obtained.  Surface markings were placed.  The CT images were loaded into the planning software.  Then, using Cine, MIP, and standard views, the internal target volume (ITV) and planning target volumes (PTV) were delinieated, and avoidance structures were contoured.  Treatment planning then occurred.  The radiation prescription was entered and confirmed.  A total of two complex treatment devices were fabricated in the form of the BodyFix immobilization pillow and a neck accuform cushion.  I have requested : 3D Simulation  I have requested a DVH of the following structures: Heart, Lungs, Esophagus, Chest Wall, Brachial Plexus, Major Blood Vessels, and targets.  SPECIAL TREATMENT PROCEDURE:  The planned course of therapy using radiation constitutes a special treatment procedure. Special care  is required in the management of this patient for the following reasons. This treatment constitutes a Special Treatment Procedure for the following reason: [ High dose per fraction requiring special monitoring for increased toxicities of treatment including daily imaging..  The special nature of the planned course of radiotherapy will require increased physician supervision and oversight to ensure patient's safety with optimal treatment outcomes.  RESPIRATORY MOTION MANAGEMENT SIMULATION:  In order to account for effect of respiratory motion on target structures and other organs in the planning and delivery of radiotherapy, this patient underwent respiratory motion management simulation.  To accomplish this, when the patient was brought to the CT simulation planning suite, 4D respiratoy motion management CT images were obtained.  The CT images were loaded into the planning software.  Then, using a variety of tools including Cine, MIP, and standard views, the target volume and planning target volumes (PTV) were delineated.  Avoidance structures were contoured.  Treatment planning then occurred.  Dose volume histograms were generated and reviewed for each of the requested structure.  The resulting plan was carefully reviewed and approved today.  PLAN:  The patient will receive 50 Gy in 5 fractions.  ________________________________  Sheral Apley Tammi Klippel, M.D.  This document serves as a record of services personally performed by Tyler Pita, MD. It was created on his behalf by Derek Mound, a trained medical scribe. The creation of this record is based on the scribe's personal observations and the provider's statements to them. This document has been checked and approved by the attending provider.

## 2015-11-26 NOTE — Progress Notes (Signed)
Received a call from Bradley Nixon in [place], stating patient is complaining of arm pain and is worried he will have a difficult time tolerating treatments.  Notified [name] PA.  Pt currently is taking Tramadol 50 mg q 8 hrs.  He reports he only takes it at night.  New prescription of Norco 5/325 (30 tablet quantity) take one tablet PO every 4-6 hours for pain PRN.  Paper prescription give to patient with instruction to take medication 30 minutes prior to treatment time and to be caution with his mobility while taking this medication. In addition, instructed this patient not to take Norco with the Tramadol.  Pt reports he understand and will feels he will only need to take if to tolerate his treatments.

## 2015-11-27 ENCOUNTER — Ambulatory Visit: Payer: Medicare Other | Admitting: Radiation Oncology

## 2015-12-07 ENCOUNTER — Ambulatory Visit
Admission: RE | Admit: 2015-12-07 | Discharge: 2015-12-07 | Disposition: A | Discharge status: Home / Self Care | Payer: Medicare Other | Source: Ambulatory Visit | Attending: Radiation Oncology | Admitting: Radiation Oncology

## 2015-12-07 DIAGNOSIS — Z510 Encounter for antineoplastic radiation therapy: Secondary | ICD-10-CM | POA: Diagnosis not present

## 2015-12-07 DIAGNOSIS — R110 Nausea: Secondary | ICD-10-CM

## 2015-12-07 DIAGNOSIS — R112 Nausea with vomiting, unspecified: Secondary | ICD-10-CM

## 2015-12-07 MED ORDER — ONDANSETRON HCL 4 MG PO TABS
4.0000 mg | ORAL_TABLET | Freq: Once | ORAL | Status: AC
  Administered 2015-12-07: 4 mg via ORAL
  Filled 2015-12-07: qty 1

## 2015-12-07 MED ORDER — ONDANSETRON HCL 4 MG PO TABS: 4.0000 mg | ORAL_TABLET | Freq: Three times a day (TID) | ORAL | Status: DC | PRN

## 2015-12-07 NOTE — Progress Notes (Signed)
As on the patient briefly prior to going back for his treatment today, he states that he has been experiencing episodes of nausea and vomiting Thursday of last week. He describes his episodes of nausea being in the morning, and episodes of emesis after eating breakfast. He denies taking any antibiotics. He states that he has been able to keep both Saturday and Sunday as well as today but states that he was concerned about becoming nauseated while on the table and requests an antibiotic currently. He denies any abdominal pain, and states that he is having normal bowel and bladder function. He is passing gas without difficulty. He denies any bloating, and states that he has not noticed any coffee-ground-like component to his emesis, nor bleeding. He does describe large volumes of phlegm production, he denies any fevers but feels as though in the morning when he wakes up that this runs down the back of his throat. Is coughing up as well.  In general this is a well appearing elderly Caucasian male in no acute distress. He is alert and oriented x4 and appropriate throughout the examination. HEENT reveals that the patient is normocephalic, atraumatic. EOMs are intact. PERRLA. Skin is intact without any evidence of gross lesions. Cardiovascular exam reveals a regular rate and rhythm, no clicks rubs or murmurs are auscultated. Chest is clear to auscultation bilaterally. Lymphatic assessment is performed and does not reveal any adenopathy in the cervical, supraclavicular, axillary, or inguinal chains. Abdomen has active bowel sounds in all quadrants and is intact. The abdomen is soft, non tender, non distended.   We discussed the utilization of Zofran, and 4 mg was provided to the patient in tablet form. Although I'm not quite certain the cause of his nausea at this point, we did discuss that if this does not improve with supportive measures, that consideration of additional imaging and workup would be warranted. He  states agreement and understanding of this. Any prescription is also called into his mail-order pharmacy for Zofran 4 mg tablets #20 no refills to be taken 1 tablet every 8 hours as needed for nausea. He was counseled on the side effect profile, and we are going to use this medication rather than Compazine or Phenergan due to his advanced age and BEERs criteria.     Carola Rhine, PAC

## 2015-12-09 ENCOUNTER — Ambulatory Visit
Admission: RE | Admit: 2015-12-09 | Discharge: 2015-12-09 | Disposition: A | Discharge status: Home / Self Care | Payer: Medicare Other | Source: Ambulatory Visit | Attending: Radiation Oncology | Admitting: Radiation Oncology

## 2015-12-09 DIAGNOSIS — Z510 Encounter for antineoplastic radiation therapy: Secondary | ICD-10-CM | POA: Diagnosis not present

## 2015-12-09 DIAGNOSIS — C3411 Malignant neoplasm of upper lobe, right bronchus or lung: Secondary | ICD-10-CM

## 2015-12-09 NOTE — Progress Notes (Signed)
The patient today treatment machine in anticipation of his SP RT treatment. He had been experiencing nausea and several episodes of emesis last week. He had explained to the treatment staff that he was experiencing some nausea on Monday and received Zofran 4 mg sublingual. He was given a new prescription for this as well, and today states that he is doing well and has not felt any additional nausea. He has not had any emesis since last Saturday. I encouraged him to keep [place] informed of any changes in his symptoms, which he agrees.     Carola Rhine, PAC

## 2015-12-11 ENCOUNTER — Ambulatory Visit
Admission: RE | Admit: 2015-12-11 | Discharge: 2015-12-11 | Disposition: A | Discharge status: Home / Self Care | Payer: Medicare Other | Source: Ambulatory Visit | Attending: Radiation Oncology | Admitting: Radiation Oncology

## 2015-12-14 ENCOUNTER — Ambulatory Visit
Admission: RE | Admit: 2015-12-14 | Discharge: 2015-12-14 | Disposition: A | Discharge status: Home / Self Care | Payer: Medicare Other | Source: Ambulatory Visit | Attending: Radiation Oncology | Admitting: Radiation Oncology

## 2015-12-14 DIAGNOSIS — Z510 Encounter for antineoplastic radiation therapy: Secondary | ICD-10-CM | POA: Diagnosis not present

## 2015-12-16 ENCOUNTER — Ambulatory Visit
Admission: RE | Admit: 2015-12-16 | Discharge: 2015-12-16 | Disposition: A | Discharge status: Home / Self Care | Payer: Medicare Other | Source: Ambulatory Visit | Attending: Radiation Oncology | Admitting: Radiation Oncology

## 2015-12-16 ENCOUNTER — Ambulatory Visit: Payer: Medicare Other | Admitting: Radiation Oncology

## 2015-12-16 DIAGNOSIS — Z510 Encounter for antineoplastic radiation therapy: Secondary | ICD-10-CM | POA: Diagnosis not present

## 2015-12-18 ENCOUNTER — Ambulatory Visit
Admission: RE | Admit: 2015-12-18 | Discharge: 2015-12-18 | Disposition: A | Discharge status: Home / Self Care | Payer: Medicare Other | Source: Ambulatory Visit | Attending: Radiation Oncology | Admitting: Radiation Oncology

## 2015-12-18 ENCOUNTER — Encounter: Payer: Self-pay | Admitting: Radiation Oncology

## 2015-12-18 VITALS — BP 110/79 | HR 82 | Temp 98.2°F | Resp 18 | Wt 148.7 lb

## 2015-12-18 DIAGNOSIS — C3411 Malignant neoplasm of upper lobe, right bronchus or lung: Secondary | ICD-10-CM

## 2015-12-18 DIAGNOSIS — Z510 Encounter for antineoplastic radiation therapy: Secondary | ICD-10-CM | POA: Diagnosis not present

## 2015-12-18 NOTE — Progress Notes (Signed)
  Radiation Oncology         [phone]   Name: Bradley Nixon MRN: [id number]   Date: 12/18/2015  DOB: 04/24/1921     Weekly Radiation Therapy Management    ICD-9-CM ICD-10-CM   1. Cancer of upper lobe of right lung (HCC) 162.3 C34.11     Current Dose: 50 Gy  Planned Dose:  50 Gy  Narrative The patient presents for routine under treatment assessment.  Weight and vitals stable. Denies chest pain. Reports pain in his shoulders, knees and hips since fall three years ago. Reports a productive cough with thick clear sputum worse at night. Reports nausea which he manages with Zofran. He has this nausea in the morning and mentions that it varies from day to day. Reports shortness of breath. Denies pain or difficulty associated with swallowing. Denies hemoptysis. No skin changes noted within treatment field. Reports fatigue. Patient completes radiation treatment today. One month follow up appointment card given. Patient understands to contact this RN with future needs. Patient reports his daughter will begin radiation treatment with Dr. Sondra Come on Thursday coming.  The patient is without complaint. Set-up films were reviewed. The chart was checked.  Physical Findings  weight is 148 lb 11.2 oz (67.45 kg). His oral temperature is 98.2 F (36.8 C). His blood pressure is 110/79 and his pulse is 82. His respiration is 18 and oxygen saturation is 97%. . Weight essentially stable.  No significant changes.  Impression The patient is tolerating radiation.  Plan Continue treatment as planned. He hasn't had a head CT since February. I recommend he gets a brain MRI because of his nausea in the morning. If his nausea is still a problem in a week or two then we will schedule an MRI of the brain.         Sheral Apley Tammi Klippel, M.D.    This document serves as a record of services personally performed by Tyler Pita, MD. It was created on his behalf by Lendon Collar, a trained medical scribe. The  creation of this record is based on the scribe's personal observations and the provider's statements to them. This document has been checked and approved by the attending provider.

## 2015-12-18 NOTE — Progress Notes (Signed)
Weight and vitals stable. Denies chest pain. Reports pain in his shoulders, knees and hips since fall three years ago. Reports a productive cough with thick clear sputum worse at night. Reports nausea which he manages with Zofran. Reports shortness of breath. Denies pain or difficulty associated with swallowing. Denies hemoptysis. No skin changes noted within treatment field. Reports fatigue. Patient completes radiation treatment today. One month follow up appointment card given. Patient understands to contact this RN with future needs. Patient reports his daughter will begin radiation treatment with Dr. Sondra Come on Thursday coming.   BP 110/79 mmHg  Pulse 82  Temp(Src) 98.2 F (36.8 C) (Oral)  Resp 18  Wt 148 lb 11.2 oz (67.45 kg)  SpO2 97% Wt Readings from Last 3 Encounters:  12/18/15 148 lb 11.2 oz (67.45 kg)  11/16/15 150 lb (68.04 kg)  10/19/15 151 lb 4.8 oz (68.629 kg)

## 2015-12-22 ENCOUNTER — Other Ambulatory Visit: Payer: Medicare Other

## 2015-12-22 ENCOUNTER — Telehealth: Payer: Self-pay | Admitting: *Deleted

## 2015-12-22 ENCOUNTER — Ambulatory Visit: Payer: Medicare Other | Admitting: Hematology & Oncology

## 2015-12-22 ENCOUNTER — Encounter: Payer: Self-pay | Admitting: Radiation Oncology

## 2015-12-22 DIAGNOSIS — C3411 Malignant neoplasm of upper lobe, right bronchus or lung: Secondary | ICD-10-CM

## 2015-12-22 NOTE — Telephone Encounter (Signed)
Mr. Jawad called asking for a refill on Zofran '8mg'$  po tabs which was ordered on 12/07/15.  He reports that he has experienced nausea and vomiting Saturday, Sunday and Monday.  Discovered that he has only taken 2 tabs since obtaining the 12/07/15 script with a remaining 18 tablets available for use.  Explained that he should get more effective relief if he takes Zofran TID, as ordered, or prn at the first sign of nausea instead of taking after having emesis. Dr. Tammi Klippel informed and plans to schedule patient for an MRI of the brain.

## 2015-12-23 ENCOUNTER — Telehealth: Payer: Self-pay | Admitting: *Deleted

## 2015-12-23 ENCOUNTER — Telehealth: Payer: Self-pay | Admitting: Cardiovascular Disease

## 2015-12-23 ENCOUNTER — Other Ambulatory Visit: Payer: Self-pay | Admitting: Radiation Oncology

## 2015-12-23 DIAGNOSIS — C3411 Malignant neoplasm of upper lobe, right bronchus or lung: Secondary | ICD-10-CM

## 2015-12-23 DIAGNOSIS — R1115 Cyclical vomiting syndrome unrelated to migraine: Secondary | ICD-10-CM

## 2015-12-23 NOTE — Telephone Encounter (Signed)
New message       Request for surgical clearance:  1. What type of surgery is being performed? MRI 2. When is this surgery scheduled?  Pending clearance  3. Are there any medications that need to be held prior to surgery and how long? Pt has a pacemaker--need ok to have MRI 4. Name of physician performing surgery?  Dr Tammi Klippel  5. What is your office phone and fax number?  Send clearance thru epic

## 2015-12-23 NOTE — Telephone Encounter (Signed)
Called patient to inform that he does not need to come today, spoke with patient and he is aware of this

## 2015-12-23 NOTE — Telephone Encounter (Signed)
The patient's pacemaker is NOT MRI compatible, unfortunately. He is not pacemaker dependent.

## 2015-12-24 ENCOUNTER — Telehealth: Payer: Self-pay | Admitting: *Deleted

## 2015-12-24 ENCOUNTER — Other Ambulatory Visit: Payer: Self-pay | Admitting: Radiation Oncology

## 2015-12-24 DIAGNOSIS — C3411 Malignant neoplasm of upper lobe, right bronchus or lung: Secondary | ICD-10-CM

## 2015-12-24 NOTE — Telephone Encounter (Signed)
Attempt to reach [facility] radiology, no answer when dialed.

## 2015-12-24 NOTE — Telephone Encounter (Signed)
CALLED PATIENT TO INFORM OF LAB ON 12-28-15 @ 1:15 PM  AND HIS CT ON 12-28-15- ARRIVAL TIME - 2:15 PM @ [facility] RADIOLOG[facility], CLEAR LIQUIDS ONLY 4 HRS. PRIOR TO TEST, TEST ORIGINALLY SCHEDULED FOR 12-25-15, PATIENT DECLINED TEST FOR 12-25-15

## 2015-12-25 ENCOUNTER — Ambulatory Visit (HOSPITAL_COMMUNITY): Payer: Medicare Other

## 2015-12-25 ENCOUNTER — Ambulatory Visit: Payer: Medicare Other

## 2015-12-25 ENCOUNTER — Inpatient Hospital Stay: Admission: RE | Admit: 2015-12-25 | Payer: Medicare Other | Source: Ambulatory Visit

## 2015-12-28 ENCOUNTER — Other Ambulatory Visit: Payer: Self-pay | Admitting: Pharmacist Clinician (PhC)/ Clinical Pharmacy Specialist

## 2015-12-28 ENCOUNTER — Other Ambulatory Visit: Payer: Self-pay | Admitting: Radiation Oncology

## 2015-12-28 ENCOUNTER — Ambulatory Visit (HOSPITAL_COMMUNITY)
Admission: RE | Admit: 2015-12-28 | Discharge: 2015-12-28 | Disposition: A | Discharge status: Home / Self Care | Payer: Medicare Other | Source: Ambulatory Visit | Attending: Radiation Oncology | Admitting: Radiation Oncology

## 2015-12-28 ENCOUNTER — Other Ambulatory Visit: Payer: Self-pay | Admitting: Radiation Therapy

## 2015-12-28 ENCOUNTER — Ambulatory Visit
Admission: RE | Admit: 2015-12-28 | Discharge: 2015-12-28 | Disposition: A | Discharge status: Home / Self Care | Payer: Medicare Other | Source: Ambulatory Visit | Attending: Radiation Oncology | Admitting: Radiation Oncology

## 2015-12-28 DIAGNOSIS — C3411 Malignant neoplasm of upper lobe, right bronchus or lung: Secondary | ICD-10-CM

## 2015-12-28 DIAGNOSIS — C3401 Malignant neoplasm of right main bronchus: Secondary | ICD-10-CM

## 2015-12-28 DIAGNOSIS — R1115 Cyclical vomiting syndrome unrelated to migraine: Secondary | ICD-10-CM

## 2015-12-28 LAB — BASIC METABOLIC PANEL WITH GFR
Anion Gap: 5 meq/L (ref 3–11)
BUN: 21.8 mg/dL (ref 7.0–26.0)
CO2: 27 meq/L (ref 22–29)
Calcium: 8.8 mg/dL (ref 8.4–10.4)
Chloride: 108 meq/L (ref 98–109)
Creatinine: 0.8 mg/dL (ref 0.7–1.3)
EGFR: 78 ml/min/1.73 m2 — ABNORMAL LOW
Glucose: 97 mg/dL (ref 70–140)
Potassium: 4.4 meq/L (ref 3.5–5.1)
Sodium: 140 meq/L (ref 136–145)

## 2015-12-28 LAB — POCT INR: INR: 1.9

## 2015-12-28 MED ORDER — PREDNISONE 50 MG PO TABS: ORAL_TABLET | ORAL | Status: DC

## 2015-12-28 MED ORDER — DIPHENHYDRAMINE HCL 50 MG PO TABS: 50.0000 mg | ORAL_TABLET | Freq: Once | ORAL | Status: DC

## 2015-12-29 ENCOUNTER — Ambulatory Visit: Payer: Medicare Other | Admitting: Radiation Oncology

## 2015-12-30 ENCOUNTER — Ambulatory Visit (INDEPENDENT_AMBULATORY_CARE_PROVIDER_SITE_OTHER): Payer: Medicare Other | Admitting: Pharmacist Clinician (PhC)/ Clinical Pharmacy Specialist

## 2015-12-30 ENCOUNTER — Telehealth: Payer: Self-pay | Admitting: Radiation Oncology

## 2015-12-30 DIAGNOSIS — Z7901 Long term (current) use of anticoagulants: Secondary | ICD-10-CM

## 2015-12-30 DIAGNOSIS — I4891 Unspecified atrial fibrillation: Secondary | ICD-10-CM

## 2015-12-30 LAB — PROTIME-INR
INR: 1.9 — AB (ref 0.8–1.2)
PROTHROMBIN TIME: 19.5 s — AB (ref 9.1–12.0)

## 2015-12-30 NOTE — Telephone Encounter (Addendum)
Returned patient's call. Patient has questions about steroid and benadryl prep before CT scan on Friday, June 9th. In great detail while patient wrote the directions down explained date and time of when to take what pill. Patient read back directions he wrote down correctly three times.

## 2015-12-31 ENCOUNTER — Telehealth: Payer: Self-pay | Admitting: Cardiovascular Disease

## 2015-12-31 NOTE — Telephone Encounter (Signed)
They are following the same protocol we would follow in the cath lab for contrast allergies. If the information is important in deciding his course of treatment, I encourage him to get it done.

## 2015-12-31 NOTE — Telephone Encounter (Signed)
SPOKE TO A PATIENT VERBALIZED UNDERSTANDING. HE STATES HE IS LITTLE ANXIOUS ,BUT HE STILL WILL THE PROCEDURE. RN INFORMED PATIENT TO FOLLOW INSTRUCTION THAT WAS GIVEN TO HIM. HE STATES HE WOULD.

## 2015-12-31 NOTE — Telephone Encounter (Signed)
WILL DEFER DR [id number]

## 2015-12-31 NOTE — Telephone Encounter (Signed)
New message  Pt states that he is going to be taken to the hospital they will run dye through his veins for a brain Scan. He states that this always causes him to pass out. And he was advised per Dr. Loletha Grayer not to have Dye and so he is requesting a call back to determine if this will be safe. Please call.

## 2015-12-31 NOTE — Telephone Encounter (Signed)
Patient was calling back to see what Dr Sallyanne Kuster thought of testing RN tried to reassure patient to follow instruction from S. Presnell RN Information has been sent to Dr Sanda Linger will call him later today  Patient states he wanted Dr Sallyanne Kuster opinion.

## 2016-01-01 ENCOUNTER — Ambulatory Visit (HOSPITAL_COMMUNITY)
Admission: RE | Admit: 2016-01-01 | Discharge: 2016-01-01 | Disposition: A | Discharge status: Home / Self Care | Payer: Medicare Other | Source: Ambulatory Visit | Attending: Radiation Oncology | Admitting: Radiation Oncology

## 2016-01-01 DIAGNOSIS — G43A0 Cyclical vomiting, not intractable: Secondary | ICD-10-CM | POA: Diagnosis not present

## 2016-01-01 DIAGNOSIS — C7931 Secondary malignant neoplasm of brain: Secondary | ICD-10-CM | POA: Diagnosis not present

## 2016-01-01 DIAGNOSIS — C3411 Malignant neoplasm of upper lobe, right bronchus or lung: Secondary | ICD-10-CM | POA: Diagnosis not present

## 2016-01-01 MED ORDER — IOPAMIDOL (ISOVUE-300) INJECTION 61%
75.0000 mL | Freq: Once | INTRAVENOUS | Status: AC | PRN
  Administered 2016-01-01: 75 mL via INTRAVENOUS

## 2016-01-04 ENCOUNTER — Other Ambulatory Visit (HOSPITAL_COMMUNITY): Payer: Medicare Other

## 2016-01-04 ENCOUNTER — Ambulatory Visit (HOSPITAL_COMMUNITY): Admission: RE | Admit: 2016-01-04 | Payer: Medicare Other | Source: Ambulatory Visit

## 2016-01-11 ENCOUNTER — Ambulatory Visit: Payer: Medicare Other | Admitting: Radiation Oncology

## 2016-01-11 ENCOUNTER — Encounter: Payer: Self-pay | Admitting: Radiation Oncology

## 2016-01-11 ENCOUNTER — Ambulatory Visit
Admission: RE | Admit: 2016-01-11 | Discharge: 2016-01-11 | Disposition: A | Discharge status: Home / Self Care | Payer: Medicare Other | Source: Ambulatory Visit | Attending: Radiation Oncology | Admitting: Radiation Oncology

## 2016-01-11 ENCOUNTER — Ambulatory Visit: Payer: Medicare Other

## 2016-01-11 VITALS — BP 103/53 | HR 89 | Temp 97.6°F | Resp 16 | Wt 145.4 lb

## 2016-01-11 DIAGNOSIS — E039 Hypothyroidism, unspecified: Secondary | ICD-10-CM | POA: Insufficient documentation

## 2016-01-11 DIAGNOSIS — Z510 Encounter for antineoplastic radiation therapy: Secondary | ICD-10-CM | POA: Diagnosis not present

## 2016-01-11 DIAGNOSIS — C3411 Malignant neoplasm of upper lobe, right bronchus or lung: Secondary | ICD-10-CM | POA: Diagnosis present

## 2016-01-11 DIAGNOSIS — I2510 Atherosclerotic heart disease of native coronary artery without angina pectoris: Secondary | ICD-10-CM | POA: Insufficient documentation

## 2016-01-11 DIAGNOSIS — C7931 Secondary malignant neoplasm of brain: Secondary | ICD-10-CM | POA: Insufficient documentation

## 2016-01-11 DIAGNOSIS — R918 Other nonspecific abnormal finding of lung field: Secondary | ICD-10-CM | POA: Insufficient documentation

## 2016-01-11 DIAGNOSIS — I482 Chronic atrial fibrillation: Secondary | ICD-10-CM | POA: Diagnosis not present

## 2016-01-11 DIAGNOSIS — Z950 Presence of cardiac pacemaker: Secondary | ICD-10-CM | POA: Insufficient documentation

## 2016-01-11 DIAGNOSIS — I252 Old myocardial infarction: Secondary | ICD-10-CM | POA: Diagnosis not present

## 2016-01-11 NOTE — Progress Notes (Signed)
Radiation Oncology         [phone]) [phone] ________________________________ Follow-up New   Name: Bradley Nixon MRN: [id number]  Date: 01/11/2016  DOB: 04/28/1921  AC:AVQSPOW, Vernie Ammons, MD  Delight Ovens, MD   REFERRING PHYSICIAN: Delight Ovens, MD  DIAGNOSIS: The primary encounter diagnosis was Cancer of upper lobe of right lung [facility]). Diagnoses of Lung mass and Brain metastases [facility]) were also pertinent to this visit.  82 y.o. gentleman with probable stage IIA (T2b, N0, M0) non-small cell lung cancer presented with large right upper lobe lung mass diagnosed in January 2017, now with new brain lesion.     ICD-9-CM ICD-10-CM   1. Cancer of upper lobe of right lung (HCC) 162.3 C34.11   2. Lung mass 786.6 R91.8   3. Brain metastases (HCC) 198.3 C79.31     HISTORY OF PRESENT ILLNESS:Bradley Nixon is a 82 y.o. male who presented to Dr. Haroldine Laws with cough with blood-tinged sputum. Chest x-ray on 08/18/2015 showed large right upper lobe lung mass worrisome for a neoplasm. This was followed by CT scan of the chest without contrast on 08/21/2015  identified a lobulated mass arising in the posterior segment of the right upper lobe measuring 6.5 x 5.4 x 5.2 cm. There were several small nodular opacities in the lungs measuring between 2 and 4 mm. Etiology of the small nodular opacities is uncertain. There was no demonstrable adenopathy. PET scan on 09/11/15 showed a large hypermetabolic 6.5 cm mass in the right upper lobe consistent with primary bronchogenic carcinoma and small sub 5 mm pulmonary nodules in the right upper lobe. There was no evidence of mediastinal nodal or distal metastases. The patient presented to Dr. Tyrone Sage and Dr. Lowella Dandy to discuss biopsy of the right lung mass, but after consideration on the part of his physicians, himself, and his family the decision was made to forgo biopsy due to risks of the procedure. He went on to receive SBRT to the right upper lobe massHe  completed 5 fractions to a total of 15 grays on 12/18/2015. During treatment he had several episodes of emesis. These originally correlated to possible gastroenteritis, however after persisting clinically, he was encouraged to proceed with MRI of the brain. He could not undergo MRI due to ICD, and a CT of the brain on 12/29/15 revealing a 6 mm lesion in the right frontal lobe. He comes for further recommendations regarding this.  PREVIOUS RADIATION THERAPY: Yes   12/07/15-12/18/15: 50 Gy to the right lung lesion in 5 fractions  PAST MEDICAL HISTORY:  Past Medical History  Diagnosis Date  . Coronary artery disease   . Myocardial infarction (HCC)   . Anginal pain (HCC)   . Hypothyroidism   . Shortness of breath   . Pacemaker 05/02/2007    [employer]  . Pneumonia   . Cancer (HCC)   . Arthritis   . Complication of anesthesia hard to arouse after anesthesia  . Permanent atrial fibrillation (HCC)      PAST SURGICAL HISTORY: Past Surgical History  Procedure Laterality Date  . Cardioversion  07/28/2011    Procedure: CARDIOVERSION;  Surgeon: Marykay Lex;  Location: [facility] OR;  Service: Cardiovascular;  Laterality: N/A;  . Joint replacement    . Permanent pacemaker insertion  05/02/2007    [employer]  . US echocardiography  09/17/2010    LA moderately dilated,mild TR, EF >55%  . Nm myocar perf wall motion  02/09/2010    no ischemia    FAMILY  HISTORY:  Family History  Problem Relation Age of Onset  . Heart failure Mother   . Heart attack Father      SOCIAL HISTORY:  reports that he has never smoked. He does not have any smokeless tobacco history on file. He reports that he does not drink alcohol or use illicit drugs. He is married and his wife is 59. 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 He reports that their children are in [place] but unable to always participate in their care. He lives in [place].  ALLERGIES: Iodinated diagnostic agents and Zocor  MEDICATIONS:  Current Outpatient  Prescriptions  Medication Sig Dispense Refill  . atorvastatin (LIPITOR) 10 MG tablet TAKE 1 TABLET EVERY EVENING 90 tablet 1  . DimenhyDRINATE (DRAMAMINE PO) Take 1 tablet by mouth as needed. Reported on 09/03/2015    . docusate sodium 100 MG CAPS Take 100 mg by mouth 2 (two) times daily. 20 capsule 0  . finasteride (PROSCAR) 5 MG tablet Take 5 mg by mouth every evening.      . fish oil-omega-3 fatty acids 1000 MG capsule Take 1 g by mouth every evening.      Marland Kitchen HYDROcodone-acetaminophen (NORCO/VICODIN) 5-325 MG tablet Take 1 tablet by mouth every 4 (four) hours as needed for moderate pain (take one tablet every 4-6 hours PRN for pain).    . Lansoprazole (PREVACID PO) Take by mouth daily. Reported on 09/03/2015    . levothyroxine (SYNTHROID, LEVOTHROID) 50 MCG tablet Take 1 tablet (50 mcg total) by mouth daily. 30 tablet 0  . meclizine (ANTIVERT) 25 MG tablet Reported on 09/03/2015  99  . Multiple Vitamin (MULITIVITAMIN WITH MINERALS) TABS Take 1 tablet by mouth every evening.      . ondansetron (ZOFRAN) 4 MG tablet Take 1 tablet (4 mg total) by mouth every 8 (eight) hours as needed for nausea or vomiting. 20 tablet 0  . Tamsulosin HCl (FLOMAX) 0.4 MG CAPS Take 0.4 mg by mouth every evening.      . traMADol (ULTRAM) 50 MG tablet Take 50 mg by mouth every 8 (eight) hours as needed.    . warfarin (COUMADIN) 5 MG tablet TAKE 1 TO 1 AND 1/2 TABLETS  DAILY OR AS DIRECTED BY COUMADIN CLINIC 135 tablet 1  . diphenhydrAMINE (BENADRYL) 50 MG tablet Take 1 tablet (50 mg total) by mouth once. Take one tablet by mouth 1 hour prior to scan. (Patient not taking: Reported on 01/11/2016) 1 tablet 0  . predniSONE (DELTASONE) 50 MG tablet Steroid prep for CT scan. Take 1 tablet 13 hours prior to scan then, 7 hours prior to scan then, 1 hour prior to exam. (Patient not taking: Reported on 01/11/2016) 3 tablet 0   No current facility-administered medications for this encounter.    REVIEW OF SYSTEMS: On review of systems,  the patient reports that he is doing ok. He states that he continues to have episodes of morning nausea which is controlled with antiemetics. He denies any chest pain, shortness of breath, cough, fevers, chills, night sweats, unintended weight changes. He denies any bowel or bladder disturbances, and denies abdominal pain. He denies any new musculoskeletal or joint aches or pains. A complete review of systems is obtained and is otherwise negative.    PHYSICAL EXAM: BP 103/53 mmHg  Pulse 89  Temp(Src) 97.6 F (36.4 C) (Oral)  Resp 16  Wt 145 lb 6.4 oz (65.953 kg)  SpO2 100% Pain scale 0/10 In general this is an elderly  appearing Caucasian male in no acute distress. He is alert and oriented x4 and appropriate throughout the examination. HEENT reveals that the patient is normocephalic, atraumatic. EOMs are intact. PERRLA. Skin is intact without any evidence of gross lesions. Cardiovascular exam reveals an irregularly irregular rate and rhythm, no clicks rubs or murmurs are auscultated.  Chest is clear to auscultation bilaterally. Lymphatic assessment is performed and does not reveal any adenopathy in the cervical, supraclavicular, axillary, or inguinal chains. Abdomen has active bowel sounds in all quadrants and is intact. The abdomen is soft, non tender, non distended. Lower extremities are negative for pretibial pitting edema, deep calf tenderness, cyanosis or clubbing.   KPS = 90  100 - Normal; no complaints; no evidence of disease. 90   - Able to carry on normal activity; minor signs or symptoms of disease. 80   - Normal activity with effort; some signs or symptoms of disease. 64   - Cares for self; unable to carry on normal activity or to do active work. 60   - Requires occasional assistance, but is able to care for most of his personal needs. 50   - Requires considerable assistance and frequent medical care. 18   - Disabled; requires special care and assistance. 44   - Severely disabled;  hospital admission is indicated although death not imminent. 70   - Very sick; hospital admission necessary; active supportive treatment necessary. 10   - Moribund; fatal processes progressing rapidly. 0     - Dead  Karnofsky DA, Abelmann [place], Craver LS and [place] [facility] [phone]) The use of the nitrogen mustards in the palliative treatment of carcinoma: with particular reference to bronchogenic carcinoma Cancer 1 634-56  LABORATORY DATA:  Lab Results  Component Value Date   WBC 6.3 10/06/2015   HGB 12.2* 10/06/2015   HCT 39.7 10/06/2015   MCV 95.4 10/06/2015   PLT 173 10/06/2015   Lab Results  Component Value Date   NA 140 12/28/2015   K 4.4 12/28/2015   CL 105 06/24/2012   CO2 27 12/28/2015   Lab Results  Component Value Date   ALT 26 09/03/2015   AST 21 09/03/2015   ALKPHOS 68 09/03/2015   BILITOT 0.58 09/03/2015   RADIOGRAPHY: Ct Head W Wo Contrast  01/01/2016  CLINICAL DATA:  82 year old male with lung cancer. Staging. Subsequent encounter. EXAM: CT HEAD WITHOUT AND WITH CONTRAST TECHNIQUE: Contiguous axial images were obtained from the base of the skull through the vertex without and with intravenous contrast CONTRAST:  54m ISOVUE-300 IOPAMIDOL (ISOVUE-300) INJECTION 61% COMPARISON:  Noncontrast head CT 09/22/2015.  PET-CT 09/11/2015. FINDINGS: Stable and well pneumatized visualized paranasal sinuses and mastoids. No acute or suspicious bone lesion of the calvarium. Stable and negative orbit and scalp soft tissues. Calcified atherosclerosis at the skull base. Cerebral volume is stable and normal for age. Stable chronic changes in the left basal ganglia suggestive of remote small vessel disease. There is a small area of vasogenic edema evident at the superior right peri-Rolandic cortex with an associated small round 5-6 mm lesion (series 604, image 25). There is no associated mass effect. No additional abnormal enhancement identified. No other areas of cerebral edema identified. No  acute intracranial hemorrhage identified. Major intracranial vascular structures are enhancing. IMPRESSION: 1. Positive for metastatic disease to the brain: Solitary 6 mm posterior right hemisphere enhancing metastasis with new mild vasogenic edema and no mass effect. 2. If not contraindicated MRI of the brain without and with contrast would be  more sensitive and might detect additional lesions. Electronically Signed   By: Genevie Ann M.D.   On: 01/01/2016 14:14   IMPRESSION: 82 y.o. gentleman with probable stage IIA (T2b, N0, M0) non-small cell lung cancer presented with large right upper lobe lung mass diagnosed in January 2017 who is high risk for biopsy, s/p SBRT with new brain 61m solitary metastases.   PLAN: Today, Dr. MTammi Klippelreviewed the findings and workup thus far with the patient. We discussed the dilemma regarding whole brain radiotherapy versus stereotactic radiosurgery. We discussed the pros and cons of each. We also discussed the logistics and delivery of each. We reviewed the results associated with each of the treatments described above. The patient seems to understand the treatment options and would like to proceed with stereotactic radiosurgery. He is also interested in moving with palliative care in the outpatient setting. He has previously met with MWadie Lessen NP, and I will contact her about meeting with him.  [facility] is scheduled for this Friday, June 23rd.  Neurology consultation with Dr. NSherwood Gamblerplanned for next Tuesday, June 27th.  We spent 60 minutes minutes face to face with the patient and more than 50% of that time was spent in counseling and/or coordination of care.    The above documentation reflects my direct findings during this shared patient visit. Please see the separate note by Dr. MTammi Klippelon this date for the remainder of the patient's plan of care.     ACarola Rhine PAC  This document serves as a record of services personally performed by  MTyler Pita MD. It was created on his behalf by TTruddie Hidden a trained medical scribe. The creation of this record is based on the scribe's personal observations and the provider's statements to them. This document has been checked and approved by the attending provider.

## 2016-01-11 NOTE — Progress Notes (Signed)
See progress note under physician encounter. 

## 2016-01-11 NOTE — Progress Notes (Signed)
Weight loss noted. BP low. Reports decreased appetite due to nausea and vomiting. Reports he feels "swimmy headed" when rising from a sitting to a standing position or when he turns his head side to side. Reports new onset ringing in the ears x 2-3 weeks. Reports a drastic decline in his short term memory x 2-3 months. Denies diplopia or headaches.   BP 103/53 mmHg  Pulse 89  Temp(Src) 97.6 F (36.4 C) (Oral)  Resp 16  Wt 145 lb 6.4 oz (65.953 kg)  SpO2 100% Wt Readings from Last 3 Encounters:  01/11/16 145 lb 6.4 oz (65.953 kg)  12/18/15 148 lb 11.2 oz (67.45 kg)  11/16/15 150 lb (68.04 kg)

## 2016-01-12 NOTE — Addendum Note (Signed)
Encounter addended by: Heywood Footman, RN on: 01/12/2016 11:36 AM<BR>     Documentation filed: Notes Section

## 2016-01-12 NOTE — Progress Notes (Signed)
Late entry from 01/11/16.Patient uses mail order pharmacy but, needs medications quicker than they can provide thus, gave patient handwritten script for Benadryl and Prednisone (steroid prep for scan on Friday) and Zofran.

## 2016-01-14 NOTE — Progress Notes (Signed)
  Radiation Oncology         [phone] ________________________________  Name: Bradley Nixon MRN: [id number]  Date: 01/15/2016  DOB: May 29, 1921  SIMULATION AND TREATMENT PLANNING NOTE    ICD-9-CM ICD-10-CM   1. Brain metastases ([place]) 198.3 C79.31     DIAGNOSIS:  82 y.o. gentleman with non-small cell lung cancer with a 6 mm right frontal solitary brain metastasis   NARRATIVE:  The patient was brought to the [facility].  Identity was confirmed.  All relevant records and images related to the planned course of therapy were reviewed.  The patient freely provided informed written consent to proceed with treatment after reviewing the details related to the planned course of therapy. The consent form was witnessed and verified by the simulation staff. Intravenous access was established for contrast administration. Then, the patient was set-up in a stable reproducible supine position for radiation therapy.  A relocatable thermoplastic stereotactic head frame was fabricated for precise immobilization.  CT images were obtained.  Surface markings were placed.  The CT images were loaded into the planning software and fused with the patient's targeting MRI scan.  Then the target and avoidance structures were contoured.  Treatment planning then occurred.  The radiation prescription was entered and confirmed.  I have requested 3D planning  I have requested a DVH of the following structures: Brain stem, brain, left eye, right eye, lenses, optic chiasm, target volumes, uninvolved brain, and normal tissue.    SPECIAL TREATMENT PROCEDURE:  The planned course of therapy using radiation constitutes a special treatment procedure. Special care is required in the management of this patient for the following reasons. This treatment constitutes a Special Treatment Procedure for the following reason: High dose per fraction requiring special monitoring for increased toxicities of treatment including daily  imaging.  The special nature of the planned course of radiotherapy will require increased physician supervision and oversight to ensure patient's safety with optimal treatment outcomes.  PLAN:  The patient will receive 20 Gy in 1 fraction.  ________________________________  Sheral Apley Tammi Klippel, M.D.

## 2016-01-14 NOTE — Addendum Note (Signed)
Encounter addended by: Heywood Footman, RN on: 01/14/2016  9:02 AM<BR>     Documentation filed: Charges VN

## 2016-01-15 ENCOUNTER — Ambulatory Visit
Admission: RE | Admit: 2016-01-15 | Discharge: 2016-01-15 | Disposition: A | Discharge status: Home / Self Care | Payer: Medicare Other | Source: Ambulatory Visit | Attending: Radiation Oncology | Admitting: Radiation Oncology

## 2016-01-15 ENCOUNTER — Ambulatory Visit: Payer: Medicare Other | Admitting: Radiation Oncology

## 2016-01-15 VITALS — BP 107/55 | HR 76 | Resp 16

## 2016-01-15 VITALS — BP 101/56 | HR 73 | Temp 97.7°F | Resp 16

## 2016-01-15 DIAGNOSIS — Z510 Encounter for antineoplastic radiation therapy: Secondary | ICD-10-CM | POA: Diagnosis not present

## 2016-01-15 DIAGNOSIS — C7931 Secondary malignant neoplasm of brain: Secondary | ICD-10-CM

## 2016-01-15 MED ORDER — SODIUM CHLORIDE 0.9% FLUSH
10.0000 mL | Freq: Once | INTRAVENOUS | Status: AC
  Administered 2016-01-15: 10 mL via INTRAVENOUS

## 2016-01-15 NOTE — Progress Notes (Signed)
Right forearm IV catheter removed, tip intat, placed 2   2x2 guase over site, taped, and held pressure, to keep on over night, can remove in am, d/c via w/c, patient voiced no pain  No c/o 4:21 PM

## 2016-01-15 NOTE — Progress Notes (Signed)
Sim staff concern patient may be experiencing a reaction to contrast. Assessed patient with Bradley Simpson, PA-C. Vitals stable. Patient reports tingling in his left leg. Alison instructed staff to proceed with [facility] and reassured patient.

## 2016-01-15 NOTE — Progress Notes (Signed)
Accompanied by neighbor. Received in the nursing clinic. Vitals stable. Denies pain. Verbalized taking benadryl and prednisone (steroid prep for contrast) as directed. BUN and creatinine from two weeks ago WDL. Started right AC 22 gauge IV on the second attempt. Patient tolerated well. Excellent blood return obtained. Site flushed without complication.  BP 101/56 mmHg  Pulse 73  Temp(Src) 97.7 F (36.5 C) (Oral)  Resp 16  SpO2 100%

## 2016-01-18 DIAGNOSIS — Z510 Encounter for antineoplastic radiation therapy: Secondary | ICD-10-CM | POA: Diagnosis not present

## 2016-01-18 NOTE — Progress Notes (Signed)
  Radiation Oncology         [phone] ________________________________  Stereotactic Treatment Procedure Note  Name: Bradley Nixon MRN: [id number]  Date: 01/19/2016  DOB: 25-Mar-1921  SPECIAL TREATMENT PROCEDURE    ICD-9-CM ICD-10-CM   1. Brain metastases ([place]) 198.3 C79.31     3D TREATMENT PLANNING AND DOSIMETRY:  The patient's radiation plan was reviewed and approved by neurosurgery and radiation oncology prior to treatment.  It showed 3-dimensional radiation distributions overlaid onto the planning CT/MRI image set.  The [facility] for the target structures as well as the organs at risk were reviewed. The documentation of the 3D plan and dosimetry are filed in the radiation oncology EMR.  NARRATIVE:  Bradley Nixon was brought to the TrueBeam stereotactic radiation treatment machine and placed supine on the CT couch. The head frame was applied, and the patient was set up for stereotactic radiosurgery.  Neurosurgery was present for the set-up and delivery  SIMULATION VERIFICATION:  In the couch zero-angle position, the patient underwent Exactrac imaging using the Brainlab system with orthogonal KV images.  These were carefully aligned and repeated to confirm treatment position for each of the isocenters.  The Exactrac snap film verification was repeated at each couch angle.  PROCEDURE: [name] received stereotactic radiosurgery to the following targets: Right frontal 6 mm target was treated using 3 Dynamic Conformal Arcs to a prescription dose of 20 Gy.  ExacTrac registration was performed for each couch angle.  The 78.7% isodose line was prescribed.  6 MV X-rays were delivered in the flattening filter free beam mode.  STEREOTACTIC TREATMENT MANAGEMENT:  Following delivery, the patient was transported to nursing in stable condition and monitored for possible acute effects.  Vital signs were recorded BP 129/56 mmHg  Pulse 79  Temp(Src) 97.6 F (36.4 C) (Oral)  Resp 16  SpO2 100%. The  patient tolerated treatment without significant acute effects, and was discharged to home in stable condition.    PLAN: Follow-up in one month.  ________________________________  Sheral Apley. Tammi Klippel, M.D.

## 2016-01-19 ENCOUNTER — Ambulatory Visit: Admission: RE | Admit: 2016-01-19 | Payer: Medicare Other | Source: Ambulatory Visit

## 2016-01-19 ENCOUNTER — Encounter: Payer: Self-pay | Admitting: Radiation Oncology

## 2016-01-19 ENCOUNTER — Ambulatory Visit
Admission: RE | Admit: 2016-01-19 | Discharge: 2016-01-19 | Disposition: A | Discharge status: Home / Self Care | Payer: Medicare Other | Source: Ambulatory Visit | Attending: Radiation Oncology | Admitting: Radiation Oncology

## 2016-01-19 ENCOUNTER — Encounter: Payer: Self-pay | Admitting: Radiation Therapy

## 2016-01-19 VITALS — BP 129/56 | HR 79 | Temp 97.6°F | Resp 16

## 2016-01-19 DIAGNOSIS — Z510 Encounter for antineoplastic radiation therapy: Secondary | ICD-10-CM | POA: Diagnosis not present

## 2016-01-19 DIAGNOSIS — C7931 Secondary malignant neoplasm of brain: Secondary | ICD-10-CM

## 2016-01-19 NOTE — Progress Notes (Signed)
  Radiation Oncology         [phone] ________________________________  Stereotactic Treatment Procedure Note  Name: Bradley Nixon MRN: [id number]  Date: 01/19/2016  DOB: 1921-07-22  SPECIAL TREATMENT PROCEDURE    ICD-9-CM ICD-10-CM   1. Brain metastases ([place]) 198.3 C79.31     3D TREATMENT PLANNING AND DOSIMETRY:  The patient's radiation plan was reviewed and approved by neurosurgery and radiation oncology prior to treatment.  It showed 3-dimensional radiation distributions overlaid onto the planning CT/MRI image set.  The [facility] for the target structures as well as the organs at risk were reviewed. The documentation of the 3D plan and dosimetry are filed in the radiation oncology EMR.  NARRATIVE:  Bradley Nixon was brought to the TrueBeam stereotactic radiation treatment machine and placed supine on the CT couch. The head frame was applied, and the patient was set up for stereotactic radiosurgery.  Neurosurgery was present for the set-up and delivery  SIMULATION VERIFICATION:  In the couch zero-angle position, the patient underwent Exactrac imaging using the Brainlab system with orthogonal KV images.  These were carefully aligned and repeated to confirm treatment position for each of the isocenters.  The Exactrac snap film verification was repeated at each couch angle.  PROCEDURE: [name] received stereotactic radiosurgery to the following targets: Right frontal 6 mm target was treated using 3 Dynamic Conformal Arcs to a prescription dose of 20 Gy.  ExacTrac registration was performed for each couch angle.  The 78.7% isodose line was prescribed.  6 MV X-rays were delivered in the flattening filter free beam mode.  STEREOTACTIC TREATMENT MANAGEMENT:  Following delivery, the patient was transported to nursing in stable condition and monitored for possible acute effects.  Vital signs were recorded BP 129/56 mmHg  Pulse 79  Temp(Src) 97.6 F (36.4 C) (Oral)  Resp 16  SpO2 100%. The  patient tolerated treatment without significant acute effects, and was discharged to home in stable condition.    PLAN: Follow-up in one month.  ________________________________  Sheral Apley. Tammi Klippel, M.D.  This document serves as a record of services personally performed by Tyler Pita, MD. It was created on his behalf by Truddie Hidden, a trained medical scribe. The creation of this record is based on the scribe's personal observations and the provider's statements to them. This document has been checked and approved by the attending provider.

## 2016-01-19 NOTE — Consult Note (Signed)
Reason for Consult:  Brain Metastasis from metastatic lung cancer Referring Physician:  Dr. Ledon Nixon  Bradley Nixon is an 82 y.o. male.  HPI:  Patient presented earlier this year with cough with blood-tinged sputum. Chest x-ray revealed a large right upper lobe lung mass, and CT scan reconfirmed the mass consistent with malignancy. PET scan confirmed a hypermetabolic mass in the right upper lobe consistent with bronchogenic carcinoma. The patient and his family elected to defer biopsy, and the patient was seen in medical oncology consultation by Dr. Fonda Nixon who recommended radiation therapy for the lung cancer. Patient was seen in second opinion consultation by Dr. Burney Nixon who concurred. Patient was seen in radiation oncology consultation with Dr. Ledon Nixon who performed radiation therapy for the lung mass. He did have some episodes of vomiting during that treatment and CT of the brain was done, which revealed a 6 mm in diameter right posterior frontal enhancing mass with mild amount of surrounding vasogenic edema. Recommendation was made for stereotactic radiosurgery for this solitary brain metastasis. Patient seen in neurosurgical consultation at the request of Dr. Tammi Nixon in planning for [facility]. Patient was seen as an outpatient in the radiation oncology clinic at [facility] prior to proceeding with treatment.  Symptomatically the patient notes about 57 pounds of weight loss between Labor Day 2016 and now. He states that he weighed 202 pounds around Labor Day last year, and now weighs 145 pounds. He describes generalized weakness because of the progressive weight loss. However he denies any headache, diplopia, blurred vision, or specific focal weakness. He does not describe any seizure activity.  He has been using a walker for about 3 years. He stopped driving about 6 months ago due to his generalized weakness as well as diminished vision related to macular  degeneration.  Past Medical History:  Past Medical History  Diagnosis Date  . Coronary artery disease   . Myocardial infarction ([place])   . Anginal pain ([place])   . Hypothyroidism   . Shortness of breath   . Pacemaker 05/02/2007    [employer]  . Pneumonia   . Cancer ([place])   . Arthritis   . Complication of anesthesia hard to arouse after anesthesia  . Permanent atrial fibrillation [facility])     Past Surgical History:  Past Surgical History  Procedure Laterality Date  . Cardioversion  07/28/2011    Procedure: CARDIOVERSION;  Surgeon: Leonie Man;  Location: [facility] OR;  Service: Cardiovascular;  Laterality: N/A;  . Joint replacement    . Permanent pacemaker insertion  05/02/2007    [employer]  . US echocardiography  09/17/2010    LA moderately dilated,mild TR, EF >55%  . Nm myocar perf wall motion  02/09/2010    no ischemia    Family History:  Family History  Problem Relation Age of Onset  . Heart failure Mother   . Heart attack Father     Social History:  reports that he has never smoked. He does not have any smokeless tobacco history on file. He reports that he does not drink alcohol or use illicit drugs.  Allergies:  Allergies  Allergen Reactions  . Iodinated Diagnostic Agents Shortness Of Breath    SOB NAUSEA AND VOMITING  . Zocor [Simvastatin]     Weakness     Medications: I have reviewed the patient's current medications.  ROS:  Notable for those difficulties described as history of present illness and past month history, but is otherwise unremarkable.  Physical Examination:  Neurological Examination: Mental Status Examination:  Patient is awake and alert, following commands, speech is fluent. Cranial Nerve Examination:  Extra ocular movements are intact. Facial sensation is intact. Mild facial asymmetry, with the right nasolabial fold being lower than the left nasal labial fold, but facial movement is symmetrical. Hearing is diminished (long-standing). Palatal movements  symmetrical. Shoulder shrug symmetrical. Tongue midline. Motor Examination:  5/5 strength in the upper and lower extremities. No drift of the upper extremities. Sensory Examination:  Intact to pinprick throughout. Reflex Examination:   Symmetrical  Assessment/Plan: Patient with metastatic lung cancer, with a solitary 6 mm diameter right posterior frontal metastasis for whom stereotactic radiosurgery is planned. I discussed the patient's condition and the CT findings (CT was done due to the patient having a pacemaker, which precluded the use of MRI). I discussed the patient and his son, who was present throughout our consultation, about the nature of his condition and the nature of the treatment. They understand that the treatment will be done in a team fashion between myself and Dr. Tammi Nixon, and the [facility] staff, including the radiation physicist. Their questions were answered regarding his situation and our plans. I also discussed with them subsequent follow-up, including with Dr. Tammi Nixon in 1 month, and then subsequently with follow-up CT the brain without and with contrast, to be arranged by Mont Dutton, the [facility] navigator, who will arrange for alternating follow-up visits between myself and Dr. Tammi Nixon.  Hosie Spangle, MD 01/19/2016, 11:08 AM

## 2016-01-19 NOTE — Progress Notes (Signed)
[place] Brain treatment went well, no problems or patient  complaints. Bradley Nixon was given an appointment to return in 1 month for follow-up with Dr. Tammi Klippel. He is not  currently taking any steroids that would require taper instructions. The plan is to get a CT of his brain and Chest in 3 months.   No MRI due to pacemaker  Mont Dutton R.T.(R)(T) Special Procedures Navigator  Radiation Oncology Department  [phone]

## 2016-01-19 NOTE — Op Note (Signed)
Stereotactic Radiosurgery Operative Note  Name: Bradley Nixon MRN: [id number]  Date: 01/19/2016  DOB: 1921-02-24  Op Note  Pre Operative Diagnosis:  Right posterior frontal brain metastasis (<1 cm) from metastatic lung cancer  Post Operative Diagnois:  Right posterior frontal brain metastasis (<1 cm) from metastatic lung cancer  3D TREATMENT PLANNING AND DOSIMETRY:  The patient's radiation plan was reviewed and approved by myself (neurosurgery) and Dr. Ledon Snare (radiation oncology) prior to treatment.  It showed 3-dimensional radiation distributions overlaid onto the planning CT/MRI image set.  The [facility] for the target structures as well as the organs at risk were reviewed. The documentation of the 3D plan and dosimetry are filed in the radiation oncology EMR.  NARRATIVE:  Bradley Nixon was brought to the TrueBeam stereotactic radiation treatment machine and placed supine on the CT couch. The head frame was applied, and the patient was set up for stereotactic radiosurgery.  I was present for the set-up and delivery.  SIMULATION VERIFICATION:  In the couch zero-angle position, the patient underwent Exactrac imaging using the Brainlab system with orthogonal KV images.  These were carefully aligned and repeated to confirm treatment position for each of the isocenters.  The Exactrac snap film verification was repeated at each couch angle.  SPECIAL TREATMENT PROCEDURE: Bradley Nixon received stereotactic radiosurgery to the following targets: Right posterior frontal target was treated using 3 Dynamic Conformal Arcs to a prescription dose of 20 Gy.  ExacTrac registration was performed for each couch angle.  The 78.7% isodose line was prescribed.  STEREOTACTIC TREATMENT MANAGEMENT:  Following delivery, the patient was transported to nursing in stable condition and monitored for possible acute effects.  Vital signs were recorded BP 132/97 mmHg  Pulse 62  Temp(Src) 97.5 F (36.4 C) (Oral)  Resp 16   SpO2 100%. The patient tolerated treatment without significant acute effects, and was discharged to home in stable condition.    PLAN: Follow-up in one month.

## 2016-01-19 NOTE — Progress Notes (Signed)
Nurse monitoring following SRS treatment is complete. Patient denies headache, dizziness, diplopia or ringing in the ears. Reports persistent nausea is no worse. Patient anxious to leave here today "to go and get my hearing aid fixed." Patient understands to avoid strenuous activity for the next 24 hours and call [phone] with needs. One month follow up appointment card given by Mont Dutton. Patient discharged home with son who wheeled him out.

## 2016-01-21 ENCOUNTER — Ambulatory Visit: Payer: Medicare Other | Admitting: Radiation Oncology

## 2016-01-23 NOTE — Progress Notes (Signed)
  Radiation Oncology         (336) [phone] ________________________________  Name: Bradley Nixon MRN: [id number]  Date: 01/19/2016  DOB: Dec 02, 1920  End of Treatment Note  Diagnosis:   82 y.o. gentleman with stage IIA (T2b, N0, M1) non-small cell lung cancer presented with large right upper lung cancer and a solitary small brain metastasis  Indication for treatment:  Curative, Definitive SBRT and definitive SRS to a solitary oligometastatic brain met  Radiation treatment dates:   12/07/15-12/18/15 Lung,  01/19/16 Brain  Site/dose:  1.  The lung target was treated to 50 Gy in 5 fractions of 10 Gy 2.  Right frontal 6 mm target was treated using 3 Dynamic Conformal Arcs to a prescription dose of 20 Gy.  Beams/energy:    1.  The patient was treated using stereotactic body radiotherapy according to a 3D conformal radiotherapy plan.  Volumetric arc fields were employed to deliver 6 MV X-rays.  Image guidance was performed with per fraction cone beam CT prior to treatment under personal MD supervision.  Immobilization was achieved using BodyFix Pillow. 2.  Right frontal 6 mm target was treated using 3 Dynamic Conformal Arcs. ExacTrac registration was performed for each couch angle. The 78.7% isodose line was prescribed. 6 MV X-rays were delivered in the flattening filter free beam mode  Narrative: The patient tolerated radiation treatment relatively well.   No complications occurred.  Plan: The patient has completed radiation treatment. The patient will return to radiation oncology clinic for routine followup in one month. I advised them to call or return sooner if they have any questions or concerns related to their recovery or treatment. ________________________________  Sheral Apley. Tammi Klippel, M.D.

## 2016-01-27 ENCOUNTER — Telehealth: Payer: Self-pay | Admitting: Radiation Oncology

## 2016-01-27 NOTE — Telephone Encounter (Signed)
Returned message left by patient. Patient states, "my side started hurting yesterday and my preacher said my appendix is where I am hurting." Patient denies any symptoms other than nausea that is unchanged.Encouraged patient to follow up with PCP reference pain. Reinforced that the patient should take zofran 4 mg every eight hours as prescribed for nausea. Patient verbalized understanding.

## 2016-02-02 ENCOUNTER — Ambulatory Visit (INDEPENDENT_AMBULATORY_CARE_PROVIDER_SITE_OTHER): Payer: Medicare Other | Admitting: Pharmacist

## 2016-02-02 DIAGNOSIS — Z7901 Long term (current) use of anticoagulants: Secondary | ICD-10-CM | POA: Diagnosis not present

## 2016-02-02 DIAGNOSIS — I4891 Unspecified atrial fibrillation: Secondary | ICD-10-CM | POA: Diagnosis not present

## 2016-02-02 LAB — POCT INR: INR: 1.5

## 2016-02-04 ENCOUNTER — Other Ambulatory Visit: Payer: Self-pay | Admitting: Radiation Oncology

## 2016-02-09 ENCOUNTER — Other Ambulatory Visit: Payer: Self-pay | Admitting: Radiation Oncology

## 2016-02-15 ENCOUNTER — Telehealth: Payer: Self-pay | Admitting: Pharmacist

## 2016-02-15 NOTE — Telephone Encounter (Signed)
Pt called because he is unable to keep appt for Thursday as he has no ride. The only time he can get a ride is Aug 2nd between 1030-1200. Instructed him that our schedule is full at that time but will add him with a note to try to work him in as he is unsure when after that he might be able to get a ride. He states he understands and appreciates help.

## 2016-02-22 ENCOUNTER — Ambulatory Visit
Admission: RE | Admit: 2016-02-22 | Discharge: 2016-02-22 | Disposition: A | Discharge status: Home / Self Care | Payer: Medicare Other | Source: Ambulatory Visit | Attending: Radiation Oncology | Admitting: Radiation Oncology

## 2016-02-22 ENCOUNTER — Encounter: Payer: Self-pay | Admitting: Radiation Oncology

## 2016-02-22 VITALS — BP 106/75 | HR 69 | Resp 16 | Wt 151.6 lb

## 2016-02-22 DIAGNOSIS — C3411 Malignant neoplasm of upper lobe, right bronchus or lung: Secondary | ICD-10-CM | POA: Insufficient documentation

## 2016-02-22 DIAGNOSIS — Y842 Radiological procedure and radiotherapy as the cause of abnormal reaction of the patient, or of later complication, without mention of misadventure at the time of the procedure: Secondary | ICD-10-CM | POA: Diagnosis not present

## 2016-02-22 DIAGNOSIS — C7931 Secondary malignant neoplasm of brain: Secondary | ICD-10-CM | POA: Insufficient documentation

## 2016-02-22 MED ORDER — ONDANSETRON HCL 4 MG PO TABS: 4.0000 mg | ORAL_TABLET | Freq: Three times a day (TID) | ORAL | 5 refills | Status: DC | PRN

## 2016-02-22 NOTE — Progress Notes (Signed)
Radiation Oncology         [phone] ________________________________  Name: Bradley Nixon MRN: [id number]  Date: 02/22/2016  DOB: 1921/02/19  Follow-Up Visit Note  CC: Bradley Jacobson, MD  Bradley Isaac, MD  Diagnosis: 82 y.o. gentleman with stage IIA (T2b, N0, M1) non-small cell lung cancer presented with large right upper lung cancer and a solitary small brain metastasis    ICD-9-CM ICD-10-CM   1. Cancer of upper lobe of right lung (HCC) 162.3 C34.11   2. Brain metastases (HCC) 198.3 C79.31     Interval Since Last Radiation: 1 month  01/19/16 Brain: Right frontal 6 mm target was treated using 3 Dynamic Conformal Arcs to a prescription dose of 20 Gy.  12/07/15-12/18/15 Lung: The lung target was treated to 50 Gy in 5 fractions of 10 Gy  Narrative:  The patient returns today for routine follow-up. Denies pain, headache, vomiting, or diplopia. Reports nausea persists. Requesting refill of Zofran. He has a week's supply left and he takes it 3 times a day, regardless if he has nausea or not. Reports blurry vision. Reports persistent tearing in both eyes. Reports constipation x 3 day. Understands to take MOM bid until BM. Reports occasional ringing in his ears in the AM only. Reports using a walker at home for his unsteady gait. Scheduled to have skin cancer from his left cheek excised tomorrow. Reports he has been without his thyroid medication for a month. Reports Dr. Mancel Nixon wants to see him back in the office before refilling this medication. Patient understands lack of this medication could be contributing to his fatigue.  ALLERGIES:  is allergic to iodinated diagnostic agents and zocor [simvastatin].  Meds: Current Outpatient Prescriptions  Medication Sig Dispense Refill  . atorvastatin (LIPITOR) 10 MG tablet TAKE 1 TABLET EVERY EVENING 90 tablet 1  . DimenhyDRINATE (DRAMAMINE PO) Take 1 tablet by mouth as needed. Reported on 09/03/2015    . diphenhydrAMINE (BENADRYL) 50 MG  tablet Take 1 tablet (50 mg total) by mouth once. Take one tablet by mouth 1 hour prior to scan. 1 tablet 0  . docusate sodium 100 MG CAPS Take 100 mg by mouth 2 (two) times daily. 20 capsule 0  . finasteride (PROSCAR) 5 MG tablet Take 5 mg by mouth every evening.      . fish oil-omega-3 fatty acids 1000 MG capsule Take 1 g by mouth every evening.      Marland Kitchen HYDROcodone-acetaminophen (NORCO/VICODIN) 5-325 MG tablet Take 1 tablet by mouth every 4 (four) hours as needed for moderate pain (take one tablet every 4-6 hours PRN for pain).    . Lansoprazole (PREVACID PO) Take by mouth daily. Reported on 09/03/2015    . meclizine (ANTIVERT) 25 MG tablet Reported on 09/03/2015  99  . Multiple Vitamin (MULITIVITAMIN WITH MINERALS) TABS Take 1 tablet by mouth every evening.      . ondansetron (ZOFRAN) 4 MG tablet Take 1 tablet (4 mg total) by mouth every 8 (eight) hours as needed for nausea or vomiting. 90 tablet 5  . Tamsulosin HCl (FLOMAX) 0.4 MG CAPS Take 0.4 mg by mouth every evening.      . traMADol (ULTRAM) 50 MG tablet Take 50 mg by mouth every 8 (eight) hours as needed.    . warfarin (COUMADIN) 5 MG tablet TAKE 1 TO 1 AND 1/2 TABLETS  DAILY OR AS DIRECTED BY COUMADIN CLINIC 135 tablet 1  . levothyroxine (SYNTHROID, LEVOTHROID) 50 MCG tablet Take 1 tablet (50  mcg total) by mouth daily. (Patient not taking: Reported on 02/22/2016) 30 tablet 0  . predniSONE (DELTASONE) 50 MG tablet Steroid prep for CT scan. Take 1 tablet 13 hours prior to scan then, 7 hours prior to scan then, 1 hour prior to exam. (Patient not taking: Reported on 02/22/2016) 3 tablet 0   No current facility-administered medications for this encounter.     Physical Findings: The patient is in no acute distress. Patient is alert and oriented.  weight is 151 lb 9.6 oz (68.8 kg). His blood pressure is 106/75 and his pulse is 69. His respiration is 16 and oxygen saturation is 100%.   The patient presents to the clinic in a wheelchair. He is hard of  hearing and wears hearing aids.  Lab Findings: Lab Results  Component Value Date   WBC 6.3 10/06/2015   HGB 12.2 (L) 10/06/2015   HGB 12.5 (L) 09/03/2015   HCT 39.7 10/06/2015   HCT 38.0 (L) 09/03/2015   PLT 173 10/06/2015   PLT 198 09/03/2015    Lab Results  Component Value Date   NA 140 12/28/2015   K 4.4 12/28/2015   CHLORIDE 108 12/28/2015   CO2 27 12/28/2015   GLUCOSE 97 12/28/2015   BUN 21.8 12/28/2015   CREATININE 0.8 12/28/2015   BILITOT 0.58 09/03/2015   ALKPHOS 68 09/03/2015   AST 21 09/03/2015   ALT 26 09/03/2015   PROT 7.1 09/03/2015   ALBUMIN 3.4 (L) 09/03/2015   CALCIUM 8.8 12/28/2015   ANIONGAP 5 12/28/2015    Radiographic Findings: No results found.  Impression:  The patient is recovering from the effects of radiation.  Plan: CT scan of the chest will be scheduled in mid-late August and follow up afterwards. The patient has a pacemaker, therefore, he will have a CT of the brain scheduled sometime in September.  The patient takes Zofran 3 times daily. I advised him to take it as needed.  _____________________________________  Bradley Nixon, M.D.  This document serves as a record of services personally performed by Bradley Pita, MD. It was created on his behalf by Bradley Nixon, a trained medical scribe. The creation of this record is based on the scribe's personal observations and the provider's statements to them. This document has been checked and approved by the attending provider.

## 2016-02-22 NOTE — Addendum Note (Signed)
Encounter addended by: Heywood Footman, RN on: 02/22/2016 12:30 PM<BR>    Actions taken: Charge Capture section accepted

## 2016-02-22 NOTE — Progress Notes (Addendum)
Weight and vitals stable. Denies pain. Denies headache, vomiting, or diplopia. Reports nausea persist. Requesting refill of Zofran. Reports blurry vision. Reports persistent tearing in both eyes. Reports constipation x 3 day. Understands to take MOM bid until BM. Reports occasional ringing in his ears in the AM only. Reports using a walker at home for his unsteady gait. Scheduled to have skin cancer from left cheek excised tomorrow. Reports he has been without his thyroid medication for a month. Reports Dr. Mancel Bale wants to see him back in the office before refilling this medication. Patient understands lack of this medication could be contributing to his fatigue.  BP 106/75 (BP Location: Right Arm, Patient Position: Sitting, Cuff Size: Normal)   Pulse 69   Resp 16   Wt 151 lb 9.6 oz (68.8 kg)   SpO2 100%   BMI 23.05 kg/m  Wt Readings from Last 3 Encounters:  02/22/16 151 lb 9.6 oz (68.8 kg)  01/11/16 145 lb 6.4 oz (66 kg)  12/18/15 148 lb 11.2 oz (67.4 kg)

## 2016-02-23 ENCOUNTER — Other Ambulatory Visit: Payer: Self-pay | Admitting: Radiation Therapy

## 2016-02-23 DIAGNOSIS — C7931 Secondary malignant neoplasm of brain: Secondary | ICD-10-CM

## 2016-02-23 DIAGNOSIS — C7949 Secondary malignant neoplasm of other parts of nervous system: Principal | ICD-10-CM

## 2016-02-24 ENCOUNTER — Ambulatory Visit (INDEPENDENT_AMBULATORY_CARE_PROVIDER_SITE_OTHER): Payer: Medicare Other | Admitting: Pharmacist Clinician (PhC)/ Clinical Pharmacy Specialist

## 2016-02-24 DIAGNOSIS — Z7901 Long term (current) use of anticoagulants: Secondary | ICD-10-CM | POA: Diagnosis not present

## 2016-02-24 DIAGNOSIS — I4891 Unspecified atrial fibrillation: Secondary | ICD-10-CM | POA: Diagnosis not present

## 2016-02-24 LAB — POCT INR: INR: 1.9

## 2016-02-29 ENCOUNTER — Telehealth: Payer: Self-pay | Admitting: *Deleted

## 2016-02-29 NOTE — Telephone Encounter (Signed)
CALLED PATIENT TO INFORM OF CT ON 03-16-16 - ARRIVAL TIME - 11:15 AM , NR TO TEST @ [facility], AND HIS FU ON 03-17-16 @ 10:30 AM @ DR. MANNING'S OFFICE, SPOKE WITH PATIENT AND HE IS AWARE OF THESE APPTS.

## 2016-03-02 ENCOUNTER — Encounter (HOSPITAL_COMMUNITY): Payer: Self-pay | Admitting: *Deleted

## 2016-03-02 ENCOUNTER — Emergency Department (HOSPITAL_COMMUNITY)
Admission: EM | Admit: 2016-03-02 | Discharge: 2016-03-02 | Disposition: A | Discharge status: Home / Self Care | Payer: Medicare Other | Attending: Emergency Medicine | Admitting: Emergency Medicine

## 2016-03-02 ENCOUNTER — Emergency Department (HOSPITAL_COMMUNITY): Payer: Medicare Other

## 2016-03-02 ENCOUNTER — Telehealth: Payer: Self-pay

## 2016-03-02 DIAGNOSIS — W19XXXA Unspecified fall, initial encounter: Secondary | ICD-10-CM

## 2016-03-02 DIAGNOSIS — Z7901 Long term (current) use of anticoagulants: Secondary | ICD-10-CM | POA: Insufficient documentation

## 2016-03-02 DIAGNOSIS — I2510 Atherosclerotic heart disease of native coronary artery without angina pectoris: Secondary | ICD-10-CM | POA: Insufficient documentation

## 2016-03-02 DIAGNOSIS — W1839XA Other fall on same level, initial encounter: Secondary | ICD-10-CM | POA: Insufficient documentation

## 2016-03-02 DIAGNOSIS — Y999 Unspecified external cause status: Secondary | ICD-10-CM | POA: Insufficient documentation

## 2016-03-02 DIAGNOSIS — Y9389 Activity, other specified: Secondary | ICD-10-CM | POA: Diagnosis not present

## 2016-03-02 DIAGNOSIS — E039 Hypothyroidism, unspecified: Secondary | ICD-10-CM | POA: Diagnosis not present

## 2016-03-02 DIAGNOSIS — Y929 Unspecified place or not applicable: Secondary | ICD-10-CM | POA: Diagnosis not present

## 2016-03-02 DIAGNOSIS — Z950 Presence of cardiac pacemaker: Secondary | ICD-10-CM | POA: Diagnosis not present

## 2016-03-02 DIAGNOSIS — Z79899 Other long term (current) drug therapy: Secondary | ICD-10-CM | POA: Insufficient documentation

## 2016-03-02 DIAGNOSIS — S0990XA Unspecified injury of head, initial encounter: Secondary | ICD-10-CM | POA: Diagnosis present

## 2016-03-02 LAB — CBC WITH DIFFERENTIAL/PLATELET
Basophils Absolute: 0 10*3/uL (ref 0.0–0.1)
Basophils Relative: 0 %
Eosinophils Absolute: 0.1 10*3/uL (ref 0.0–0.7)
Eosinophils Relative: 1 %
HEMATOCRIT: 35.1 % — AB (ref 39.0–52.0)
HEMOGLOBIN: 11.3 g/dL — AB (ref 13.0–17.0)
LYMPHS ABS: 1.2 10*3/uL (ref 0.7–4.0)
LYMPHS PCT: 16 %
MCH: 31.1 pg (ref 26.0–34.0)
MCHC: 32.2 g/dL (ref 30.0–36.0)
MCV: 96.7 fL (ref 78.0–100.0)
MONOS PCT: 15 %
Monocytes Absolute: 1.1 10*3/uL — ABNORMAL HIGH (ref 0.1–1.0)
NEUTROS ABS: 4.8 10*3/uL (ref 1.7–7.7)
NEUTROS PCT: 68 %
Platelets: 179 10*3/uL (ref 150–400)
RBC: 3.63 MIL/uL — ABNORMAL LOW (ref 4.22–5.81)
RDW: 14.4 % (ref 11.5–15.5)
WBC: 7.1 10*3/uL (ref 4.0–10.5)

## 2016-03-02 LAB — BASIC METABOLIC PANEL
ANION GAP: 5 (ref 5–15)
BUN: 17 mg/dL (ref 6–20)
CHLORIDE: 102 mmol/L (ref 101–111)
CO2: 29 mmol/L (ref 22–32)
Calcium: 8.3 mg/dL — ABNORMAL LOW (ref 8.9–10.3)
Creatinine, Ser: 0.73 mg/dL (ref 0.61–1.24)
GFR calc Af Amer: >60 mL/min (ref >60)
GFR calc non Af Amer: >60 mL/min (ref >60)
Glucose, Bld: 106 mg/dL — ABNORMAL HIGH (ref 65–99)
Potassium: 4.5 mmol/L (ref 3.5–5.1)
SODIUM: 136 mmol/L (ref 135–145)

## 2016-03-02 LAB — PROTIME-INR
INR: 1.92
Prothrombin Time: 22.3 seconds — ABNORMAL HIGH (ref 11.4–15.2)

## 2016-03-02 MED ORDER — ACETAMINOPHEN 325 MG PO TABS
650.0000 mg | ORAL_TABLET | Freq: Once | ORAL | Status: AC
  Administered 2016-03-02: 650 mg via ORAL
  Filled 2016-03-02: qty 2

## 2016-03-02 NOTE — ED Notes (Signed)
PT DISCHARGED. INSTRUCTIONS GIVEN. AAOX4. PT IN NO APPARENT DISTRESS. THE OPPORTUNITY TO ASK QUESTIONS WAS PROVIDED. 

## 2016-03-02 NOTE — ED Provider Notes (Signed)
  Face-to-face evaluation   History: Patient fell and struck the back of his head while he was trying to help his wife off the floor. He did not lose consciousness. He complains of pain in his head and his neck.  Physical exam: Alert, elderly man who appears comfortable. Had mild occipital tenderness without deformity. Moves arms and legs equally. There is no dysarthria or aphasia.  Medical screening examination/treatment/procedure(s) were conducted as a shared visit with non-physician practitioner(s) and myself.  I personally evaluated the patient during the encounter   Daleen Bo, MD 03/02/16 1825

## 2016-03-02 NOTE — ED Triage Notes (Addendum)
Patient states earlier today he was attempting to help his wife up from the floor (she is a patient in ED currently via EMS), and he fell back under her weight and hit the back of his head on a tile floor.  Patient denies LOC, changes in vision or N/V.  Patient also c/o low back pain for 3-4 years after a fall then that is worse today after this fall.    Patient takes coumadin because of atrial fib.  No hematoma, laceration or deformity noted to back of patient's head.

## 2016-03-02 NOTE — ED Notes (Signed)
Patient transported to X-ray 

## 2016-03-02 NOTE — Telephone Encounter (Signed)
Pt states that his wife fell into him this am and they both fell down. EMS is coming to pick his wife up to go to hospital with possible broken hip. Pt hit his head. He called Dr Mancel Bale who's nurse told him to go to ER to get checked. He was trying to see if Dr Tammi Klippel would check his head so he could stay home and take care of things at home and take care of his wife. Instructed pt to go to ER, if they do not find any bleeding or other reason to put him in the hospital they will let him go home. Also s/w Aldona Bar RN and Dr Tammi Klippel is out for the week. Pt ended call with saying he would go to ER.

## 2016-03-02 NOTE — ED Provider Notes (Signed)
[facility] DEPT Provider Note   CSN: [id number] Arrival date & time: 03/02/16  1222  First Provider Contact:  First MD Initiated Contact with Patient 03/02/16 1259     History   Chief Complaint Chief Complaint  Patient presents with  . Head Injury    HPI Bradley Nixon is a 82 y.o. male.  HPI  82 y.o. male with a hx of Atrial Fibrillation on Coumadin, presents to the Emergency Department today s/p mechanical fall attempting to help his wife from the floor. States that as he lifted his wife, her weight caused him to fall backwards and hit the back of his head. No LOC. Notes headache is 6/10 and throbbing on posterior head. Notes mild cervical spine pain, but maintains ROM without difficulty. Notes worsening lumbar pain from chronic back pain as well. No loss of bowel or bladder function. No saddle anesthesia. Pt able to ambulate. No N/V post fall. No vision changes. No CP/SOB/ABD pain. No numbness/tingling. No other symptoms noted.   Past Medical History:  Diagnosis Date  . Anginal pain ([place])   . Arthritis   . Cancer ([place])   . Complication of anesthesia hard to arouse after anesthesia  . Coronary artery disease   . Hypothyroidism   . Myocardial infarction ([place])   . Pacemaker 05/02/2007   [facility]  . Permanent atrial fibrillation ([place])   . Pneumonia   . Shortness of breath     Patient Active Problem List   Diagnosis Date Noted  . Brain metastases ([place]) 01/11/2016  . Lung mass   . DNR (do not resuscitate) 10/19/2015  . Palliative care encounter 10/19/2015  . Cancer of upper lobe of right lung ([place]) 08/24/2015  . Dyspnea 10/18/2013  . Hyperlipidemia 04/17/2013  . Atrial fibrillation ([place]) 10/10/2012  . Long term current use of anticoagulant therapy 10/10/2012  . Fracture of inferior pubic ramus ([place]) 06/24/2012  . Fracture of superior pubic ramus ([place]) 06/24/2012  . Sacral fracture ([place]) 06/24/2012  . Fall 06/24/2012  . Hypothyroidism   . Pacemaker   . Myocardial  infarction [facility])     Past Surgical History:  Procedure Laterality Date  . CARDIOVERSION  07/28/2011   Procedure: CARDIOVERSION;  Surgeon: Leonie Man;  Location: [facility] OR;  Service: Cardiovascular;  Laterality: N/A;  . JOINT REPLACEMENT    . NM MYOCAR PERF WALL MOTION  02/09/2010   no ischemia  . PERMANENT PACEMAKER INSERTION  05/02/2007   [facility]  . US ECHOCARDIOGRAPHY  09/17/2010   LA moderately dilated,mild TR, EF >55%       Home Medications    Prior to Admission medications   Medication Sig Start Date End Date Taking? Authorizing Provider  atorvastatin (LIPITOR) 10 MG tablet TAKE 1 TABLET EVERY EVENING 11/06/15   Mihai Croitoru, MD  DimenhyDRINATE (DRAMAMINE PO) Take 1 tablet by mouth as needed. Reported on 09/03/2015    Historical Provider, MD  diphenhydrAMINE (BENADRYL) 50 MG tablet Take 1 tablet (50 mg total) by mouth once. Take one tablet by mouth 1 hour prior to scan. 12/28/15   Xandria Gallaga Pita, MD  docusate sodium 100 MG CAPS Take 100 mg by mouth 2 (two) times daily. 06/26/12   Thurnell Lose, MD  finasteride (PROSCAR) 5 MG tablet Take 5 mg by mouth every evening.      Historical Provider, MD  fish oil-omega-3 fatty acids 1000 MG capsule Take 1 g by mouth every evening.      Historical Provider, MD  HYDROcodone-acetaminophen (NORCO/VICODIN) 5-325  MG tablet Take 1 tablet by mouth every 4 (four) hours as needed for moderate pain (take one tablet every 4-6 hours PRN for pain).    Historical Provider, MD  Lansoprazole (PREVACID PO) Take by mouth daily. Reported on 09/03/2015    Historical Provider, MD  levothyroxine (SYNTHROID, LEVOTHROID) 50 MCG tablet Take 1 tablet (50 mcg total) by mouth daily. Patient not taking: Reported on 02/22/2016 03/25/15   Sanda Klein, MD  meclizine (ANTIVERT) 25 MG tablet Reported on 09/03/2015 06/30/15   Historical Provider, MD  Multiple Vitamin (MULITIVITAMIN WITH MINERALS) TABS Take 1 tablet by mouth every evening.      Historical Provider, MD  ondansetron  (ZOFRAN) 4 MG tablet Take 1 tablet (4 mg total) by mouth every 8 (eight) hours as needed for nausea or vomiting. 02/22/16   Jun Rightmyer Pita, MD  predniSONE (DELTASONE) 50 MG tablet Steroid prep for CT scan. Take 1 tablet 13 hours prior to scan then, 7 hours prior to scan then, 1 hour prior to exam. Patient not taking: Reported on 02/22/2016 12/28/15   Magdiel Bartles Pita, MD  Tamsulosin HCl (FLOMAX) 0.4 MG CAPS Take 0.4 mg by mouth every evening.      Historical Provider, MD  traMADol (ULTRAM) 50 MG tablet Take 50 mg by mouth every 8 (eight) hours as needed.    Historical Provider, MD  warfarin (COUMADIN) 5 MG tablet TAKE 1 TO 1 AND 1/2 TABLETS  DAILY OR AS DIRECTED BY COUMADIN CLINIC 07/28/15   Sanda Klein, MD    Family History Family History  Problem Relation Age of Onset  . Heart failure Mother   . Heart attack Father     Social History Social History  Substance Use Topics  . Smoking status: Never Smoker  . Smokeless tobacco: Never Used  . Alcohol use No     Allergies   Iodinated diagnostic agents and Zocor [simvastatin]   Review of Systems Review of Systems ROS reviewed and all are negative for acute change except as noted in the HPI.  Physical Exam Updated Vital Signs BP 107/61 (BP Location: Left Arm)   Pulse 96   Temp 98.1 F (36.7 C) (Oral)   Resp 18   Ht '5\' 7"'$  (1.702 m)   Wt 68 kg   SpO2 93%   BMI 23.49 kg/m   Physical Exam  Constitutional: He is oriented to person, place, and time. Vital signs are normal. He appears well-developed and well-nourished.  HENT:  Head: Normocephalic.  Right Ear: Hearing normal.  Left Ear: Hearing normal.  Eyes: Conjunctivae and EOM are normal. Pupils are equal, round, and reactive to light.  Neck: Normal range of motion. Neck supple.  Cardiovascular: Normal rate and intact distal pulses.   Murmur heard. Pulmonary/Chest: Effort normal and breath sounds normal.  Abdominal: Soft.  Neurological: He is alert and oriented to person,  place, and time. He has normal strength. No cranial nerve deficit or sensory deficit.  Cranial Nerves:  II: Pupils equal, round, reactive to light III,IV, VI: ptosis not present, extra-ocular motions intact bilaterally  V,VII: smile symmetric, facial light touch sensation equal VIII: hearing grossly normal bilaterally  IX,X: midline uvula rise  XI: bilateral shoulder shrug equal and strong XII: midline tongue extension  Skin: Skin is warm and dry.  Psychiatric: He has a normal mood and affect. His speech is normal and behavior is normal. Thought content normal.   ED Treatments / Results  Labs (all labs ordered are listed, but only abnormal results are  displayed) Labs Reviewed  BASIC METABOLIC PANEL - Abnormal; Notable for the following:       Result Value   Glucose, Bld 106 (*)    Calcium 8.3 (*)    All other components within normal limits  CBC WITH DIFFERENTIAL/PLATELET - Abnormal; Notable for the following:    RBC 3.63 (*)    Hemoglobin 11.3 (*)    HCT 35.1 (*)    Monocytes Absolute 1.1 (*)    All other components within normal limits  PROTIME-INR - Abnormal; Notable for the following:    Prothrombin Time 22.3 (*)    All other components within normal limits    EKG  EKG Interpretation None      Radiology Dg Lumbar Spine Complete  Result Date: 03/02/2016 CLINICAL DATA:  Fall at home today.  Back pain. EXAM: LUMBAR SPINE - COMPLETE 4+ VIEW COMPARISON:  None. FINDINGS: No acute fracture line or compression deformity is noted. Diffuse degenerative disc narrowing with bulky spurring at T12-L1 and L4-5. Severe L4-5 facet arthropathy. Osteopenia. No evidence of focal bone lesion or endplate erosion. Remote and healed right obturator ring fracture. IMPRESSION: No acute finding. Electronically Signed   By: Monte Fantasia M.D.   On: 03/02/2016 13:37   Ct Head Wo Contrast  Result Date: 03/02/2016 CLINICAL DATA:  Fall at home with occipital injury. Initial encounter. EXAM: CT HEAD  WITHOUT CONTRAST CT CERVICAL SPINE WITHOUT CONTRAST TECHNIQUE: Multidetector CT imaging of the head and cervical spine was performed following the standard protocol without intravenous contrast. Multiplanar CT image reconstructions of the cervical spine were also generated. COMPARISON:  Head CT 01/01/2016 FINDINGS: CT HEAD FINDINGS Brain: No evidence of acute infarction, hemorrhage, hydrocephalus. Cortical and subcortical low-density in the right peri-rolandic region is mildly progressed from prior. No hemorrhage. No mass effect. Generalized cerebral volume loss, stable. Remote small vessel infarcts in the left putamen and caudate nucleus. Vascular: Atherosclerotic calcifications. Skull: Negative for fracture or focal lesion. Sinuses/Orbits: Bilateral cataract resection. Other: None. CT CERVICAL SPINE FINDINGS Alignment: C5-6 and C7-T1 anterolisthesis is likely facet mediated. Skull base and vertebrae: No acute fracture. Intervertebral ankylosis from C3-C5. Sub cm sclerotic focus in the lower T1 body is stable since 08/21/2015. No noted bone metastases on 09/11/2015 PET CT. Soft tissues and canal: No prevertebral fluid. No gross canal hematoma. Degenerative: Diffuse degenerative disc narrowing and arthropathy. IMPRESSION: 1. No evidence of acute intracranial or cervical spine injury. 2. Mild increase in white matter low-density at the treated posterior right frontal metastasis. No mass effect. Electronically Signed   By: Monte Fantasia M.D.   On: 03/02/2016 14:09   Ct Cervical Spine Wo Contrast  Result Date: 03/02/2016 CLINICAL DATA:  Fall at home with occipital injury. Initial encounter. EXAM: CT HEAD WITHOUT CONTRAST CT CERVICAL SPINE WITHOUT CONTRAST TECHNIQUE: Multidetector CT imaging of the head and cervical spine was performed following the standard protocol without intravenous contrast. Multiplanar CT image reconstructions of the cervical spine were also generated. COMPARISON:  Head CT 01/01/2016  FINDINGS: CT HEAD FINDINGS Brain: No evidence of acute infarction, hemorrhage, hydrocephalus. Cortical and subcortical low-density in the right peri-rolandic region is mildly progressed from prior. No hemorrhage. No mass effect. Generalized cerebral volume loss, stable. Remote small vessel infarcts in the left putamen and caudate nucleus. Vascular: Atherosclerotic calcifications. Skull: Negative for fracture or focal lesion. Sinuses/Orbits: Bilateral cataract resection. Other: None. CT CERVICAL SPINE FINDINGS Alignment: C5-6 and C7-T1 anterolisthesis is likely facet mediated. Skull base and vertebrae: No acute fracture. Intervertebral ankylosis  from C3-C5. Sub cm sclerotic focus in the lower T1 body is stable since 08/21/2015. No noted bone metastases on 09/11/2015 PET CT. Soft tissues and canal: No prevertebral fluid. No gross canal hematoma. Degenerative: Diffuse degenerative disc narrowing and arthropathy. IMPRESSION: 1. No evidence of acute intracranial or cervical spine injury. 2. Mild increase in white matter low-density at the treated posterior right frontal metastasis. No mass effect. Electronically Signed   By: Monte Fantasia M.D.   On: 03/02/2016 14:09    Procedures Procedures (including critical care time)  Medications Ordered in ED Medications  acetaminophen (TYLENOL) tablet 650 mg (650 mg Oral Given 03/02/16 1315)    Initial Impression / Assessment and Plan / ED Course  I have reviewed the triage vital signs and the nursing notes.  Pertinent labs & imaging results that were available during my care of the patient were reviewed by me and considered in my medical decision making (see chart for details).  Clinical Course   Final Clinical Impressions(s) / ED Diagnoses  I have reviewed and evaluated the relevant laboratory values I have reviewed and evaluated the relevant imaging studies. I have reviewed the relevant previous healthcare records. I obtained HPI from historian. Patient  discussed with supervising physician  ED Course:  Assessment: Pt is a 89yM with hx Afib on Coumadin who presents ws/p mechanical fall assisting his wife from floor [place] backwards and struck posterior head. No LOC. Notes headache. No vision changes. No N/V. On exam, pt in NAD. Nontoxic/nonseptic appearing. VSS. Afebrile. Lungs CTA. Heart RRR. Abdomen nontender soft. CN evaluated and unremarkable. CT Head/Neck unremarkable for acute abnormalities. DG Lumbar unremarkable Given tylenol in ED. CBC/BMP unremarkable. INR WNL. Plan is to DC home with follow up to PCp. At time of discharge, Patient is in no acute distress. Vital Signs are stable. Patient is able to ambulate. Patient able to tolerate PO.    Disposition/Plan:  DC Home Additional Verbal discharge instructions given and discussed with patient.  Pt Instructed to f/u with PCP in the next week for evaluation and treatment of symptoms. Return precautions given Pt acknowledges and agrees with plan  Supervising Physician Daleen Bo, MD   Final diagnoses:  Fall, initial encounter  Head trauma, initial encounter    New Prescriptions New Prescriptions   No medications on file      Shary Decamp, PA-C 03/02/16 [place], MD 03/02/16 1825

## 2016-03-02 NOTE — Discharge Instructions (Signed)
Please read and follow all provided instructions.  Your diagnoses today include:  1. Fall, initial encounter   2. Head trauma, initial encounter     Tests performed today include: Vital signs. See below for your results today.  CT Scan Head and Neck Xray of Lower Back  Medications prescribed:  Take as prescribed   Home care instructions:  Follow any educational materials contained in this packet.  Follow-up instructions: Please follow-up with your primary care provider for further evaluation of symptoms and treatment   Return instructions:  Please return to the Emergency Department if you do not get better, if you get worse, or new symptoms OR  - Fever (temperature greater than 101.25F)  - Bleeding that does not stop with holding pressure to the area    -Severe pain (please note that you may be more sore the day after your accident)  - Chest Pain  - Difficulty breathing  - Severe nausea or vomiting  - Inability to tolerate food and liquids  - Passing out  - Skin becoming red around your wounds  - Change in mental status (confusion or lethargy)  - New numbness or weakness    Please return if you have any other emergent concerns.  Additional Information:  Your vital signs today were: BP 107/69 (BP Location: Right Arm)    Pulse 70    Temp 98.1 F (36.7 C) (Oral)    Resp 16    Ht '5\' 7"'$  (1.702 m)    Wt 68 kg    SpO2 94%    BMI 23.49 kg/m  If your blood pressure (BP) was elevated above 135/85 this visit, please have this repeated by your doctor within one month. --------------

## 2016-03-09 ENCOUNTER — Emergency Department (HOSPITAL_COMMUNITY)
Admission: EM | Admit: 2016-03-09 | Discharge: 2016-03-09 | Disposition: A | Discharge status: Home / Self Care | Payer: Medicare Other | Attending: Emergency Medicine | Admitting: Emergency Medicine

## 2016-03-09 ENCOUNTER — Encounter (HOSPITAL_COMMUNITY): Payer: Self-pay

## 2016-03-09 ENCOUNTER — Emergency Department (HOSPITAL_COMMUNITY): Payer: Medicare Other

## 2016-03-09 DIAGNOSIS — E039 Hypothyroidism, unspecified: Secondary | ICD-10-CM | POA: Diagnosis not present

## 2016-03-09 DIAGNOSIS — Z85118 Personal history of other malignant neoplasm of bronchus and lung: Secondary | ICD-10-CM | POA: Diagnosis not present

## 2016-03-09 DIAGNOSIS — S3992XA Unspecified injury of lower back, initial encounter: Secondary | ICD-10-CM | POA: Diagnosis present

## 2016-03-09 DIAGNOSIS — Z79899 Other long term (current) drug therapy: Secondary | ICD-10-CM | POA: Diagnosis not present

## 2016-03-09 DIAGNOSIS — R609 Edema, unspecified: Secondary | ICD-10-CM | POA: Insufficient documentation

## 2016-03-09 DIAGNOSIS — Z950 Presence of cardiac pacemaker: Secondary | ICD-10-CM | POA: Insufficient documentation

## 2016-03-09 DIAGNOSIS — Y929 Unspecified place or not applicable: Secondary | ICD-10-CM | POA: Insufficient documentation

## 2016-03-09 DIAGNOSIS — Y999 Unspecified external cause status: Secondary | ICD-10-CM | POA: Insufficient documentation

## 2016-03-09 DIAGNOSIS — Z7901 Long term (current) use of anticoagulants: Secondary | ICD-10-CM | POA: Insufficient documentation

## 2016-03-09 DIAGNOSIS — W19XXXA Unspecified fall, initial encounter: Secondary | ICD-10-CM | POA: Insufficient documentation

## 2016-03-09 DIAGNOSIS — Y939 Activity, unspecified: Secondary | ICD-10-CM | POA: Diagnosis not present

## 2016-03-09 DIAGNOSIS — I2510 Atherosclerotic heart disease of native coronary artery without angina pectoris: Secondary | ICD-10-CM | POA: Insufficient documentation

## 2016-03-09 DIAGNOSIS — S32020A Wedge compression fracture of second lumbar vertebra, initial encounter for closed fracture: Secondary | ICD-10-CM | POA: Insufficient documentation

## 2016-03-09 MED ORDER — TRAMADOL HCL 50 MG PO TABS
50.0000 mg | ORAL_TABLET | Freq: Once | ORAL | Status: AC
  Administered 2016-03-09: 50 mg via ORAL
  Filled 2016-03-09: qty 1

## 2016-03-09 MED ORDER — TRAMADOL HCL 50 MG PO TABS: 50.0000 mg | ORAL_TABLET | Freq: Four times a day (QID) | ORAL | 0 refills | Status: AC | PRN

## 2016-03-09 NOTE — ED Notes (Signed)
Bed: WA02 Expected date:  Expected time:  Means of arrival:  Comments: 41M/pain d/t fall last week

## 2016-03-09 NOTE — ED Triage Notes (Signed)
Per EMS- Patient reports that he fell a week ago and was given Tramadol for pain. Patient reports that the Tramadol only works for 30 minutes and then he is back to where he is hurting. Patient c/o coccyx pain that radiates into bilateral hips and right foot swelling that is not related to the fall a week ago.

## 2016-03-09 NOTE — ED Provider Notes (Signed)
[facility] DEPT Provider Note   CSN: [id number] Arrival date & time: 03/09/16  1216     History   Chief Complaint Chief Complaint  Patient presents with  . Tailbone Pain  . Hip Pain  . Foot Swelling    HPI Bradley Nixon is a 82 y.o. male who presents with low back pain after a fall on 8/9. PMH significant for arthritis, A.fib currently on anticoagulation, hx of hip fracture and sacral fracture, multiple falls. He states that his pain has been getting worse since he was seen. He has been requiring more assistance getting in and out of bed. He normally walks with a walker but is having difficulty ambulating. Reports the pain radiates across his lower back and in to his hips. Denies numbnes, tingling in the legs. Denies inability to walk, saddle anesthesia, bowel/bladder incontinence.  HPI  Past Medical History:  Diagnosis Date  . Anginal pain ([place])   . Arthritis   . Cancer ([place])   . Complication of anesthesia hard to arouse after anesthesia  . Coronary artery disease   . Hypothyroidism   . Myocardial infarction ([place])   . Pacemaker 05/02/2007   [employer]  . Permanent atrial fibrillation ([place])   . Pneumonia   . Shortness of breath     Patient Active Problem List   Diagnosis Date Noted  . Brain metastases ([place]) 01/11/2016  . Lung mass   . DNR (do not resuscitate) 10/19/2015  . Palliative care encounter 10/19/2015  . Cancer of upper lobe of right lung ([place]) 08/24/2015  . Dyspnea 10/18/2013  . Hyperlipidemia 04/17/2013  . Atrial fibrillation ([place]) 10/10/2012  . Long term current use of anticoagulant therapy 10/10/2012  . Fracture of inferior pubic ramus ([employer]) 06/24/2012  . Fracture of superior pubic ramus ([place]) 06/24/2012  . Sacral fracture ([place]) 06/24/2012  . Fall 06/24/2012  . Hypothyroidism   . Pacemaker   . Myocardial infarction [facility])     Past Surgical History:  Procedure Laterality Date  . CARDIOVERSION  07/28/2011   Procedure: CARDIOVERSION;  Surgeon: Leonie Man;  Location: [facility] OR;  Service: Cardiovascular;  Laterality: N/A;  . JOINT REPLACEMENT    . NM MYOCAR PERF WALL MOTION  02/09/2010   no ischemia  . PERMANENT PACEMAKER INSERTION  05/02/2007   [facility]  . US ECHOCARDIOGRAPHY  09/17/2010   LA moderately dilated,mild TR, EF >55%       Home Medications    Prior to Admission medications   Medication Sig Start Date End Date Taking? Authorizing Provider  atorvastatin (LIPITOR) 10 MG tablet TAKE 1 TABLET EVERY EVENING Patient taking differently: TAKE 10 MG BY MOUTH EVERY EVENING 11/06/15  Yes Mihai Croitoru, MD  finasteride (PROSCAR) 5 MG tablet Take 5 mg by mouth daily. 02/08/16  Yes Historical Provider, MD  fish oil-omega-3 fatty acids 1000 MG capsule Take 2 g by mouth every evening.    Yes Historical Provider, MD  Lansoprazole (PREVACID PO) Take 30 m by mouth daily. Reported on 09/03/2015   Yes Historical Provider, MD  meclizine (ANTIVERT) 25 MG tablet Take 25 mg by mouth daily as needed for dizziness/vertigo 06/30/15  Yes Historical Provider, MD  Multiple Vitamin (MULITIVITAMIN WITH MINERALS) TABS Take 1 tablet by mouth every evening.     Yes Historical Provider, MD  ondansetron (ZOFRAN) 4 MG tablet TAKE 1 TABLET EVERY 8 HOURS AS NEEDED FOR NAUSEA AND VOMITING Patient taking differently: TAKE 4 MG EVERY 8 HOURS AS NEEDED FOR NAUSEA AND VOMITING 03/05/16  Yes Tyler Pita, MD  Tamsulosin HCl (FLOMAX) 0.4 MG CAPS Take 0.8 mg by mouth daily after supper.    Yes Historical Provider, MD  traMADol (ULTRAM) 50 MG tablet Take 100 mg by mouth every 8 (eight) hours as needed for moderate pain.    Yes Historical Provider, MD  warfarin (COUMADIN) 5 MG tablet TAKE 1 TO 1 AND 1/2 TABLETS  DAILY OR AS DIRECTED BY COUMADIN CLINIC Patient taking differently: TAKE 1 TO 1 AND 1/2 TABLETS  DAILY OR AS DIRECTED BY COUMADIN CLINIC every day take 7.5 mg except Sunday and Thursday take 5 mg 07/28/15  Yes Mihai Croitoru, MD  diphenhydrAMINE (BENADRYL) 50 MG tablet  Take 1 tablet (50 mg total) by mouth once. Take one tablet by mouth 1 hour prior to scan. Patient not taking: Reported on 03/02/2016 12/28/15   Tyler Pita, MD  docusate sodium 100 MG CAPS Take 100 mg by mouth 2 (two) times daily. Patient not taking: Reported on 03/02/2016 06/26/12   Thurnell Lose, MD  levothyroxine (SYNTHROID, LEVOTHROID) 50 MCG tablet Take 1 tablet (50 mcg total) by mouth daily. Patient not taking: Reported on 02/22/2016 03/25/15   Mihai Croitoru, MD  ondansetron (ZOFRAN) 4 MG tablet Take 1 tablet (4 mg total) by mouth every 8 (eight) hours as needed for nausea or vomiting. Patient not taking: Reported on 03/09/2016 02/22/16   Tyler Pita, MD  predniSONE (DELTASONE) 50 MG tablet Steroid prep for CT scan. Take 1 tablet 13 hours prior to scan then, 7 hours prior to scan then, 1 hour prior to exam. Patient not taking: Reported on 02/22/2016 12/28/15   Tyler Pita, MD    Family History Family History  Problem Relation Age of Onset  . Heart failure Mother   . Heart attack Father     Social History Social History  Substance Use Topics  . Smoking status: Never Smoker  . Smokeless tobacco: Never Used  . Alcohol use No     Allergies   Iodinated diagnostic agents and Zocor [simvastatin]   Review of Systems Review of Systems  Constitutional: Negative for fever.  Respiratory: Negative for shortness of breath.   Cardiovascular: Negative for chest pain.  Gastrointestinal: Negative for abdominal pain.  Musculoskeletal: Positive for back pain and gait problem.  Neurological: Negative for weakness and numbness.  All other systems reviewed and are negative.    Physical Exam Updated Vital Signs BP 112/61   Pulse 93   Temp 98.2 F (36.8 C) (Oral)   Resp 18   Ht '5\' 7"'$  (1.702 m)   Wt 68 kg   SpO2 94%   BMI 23.49 kg/m   Physical Exam  Constitutional: He is oriented to person, place, and time. He appears well-developed and well-nourished. No distress.  Elderly  male, NAD  HENT:  Head: Normocephalic and atraumatic.  Eyes: Conjunctivae are normal. Pupils are equal, round, and reactive to light. Right eye exhibits no discharge. Left eye exhibits no discharge. No scleral icterus.  Neck: Normal range of motion. Neck supple.  Cardiovascular: Normal rate and regular rhythm.  Exam reveals no gallop and no friction rub.   Murmur heard. Grade 2 murmur in mitral area  Pulmonary/Chest: Effort normal and breath sounds normal. No respiratory distress. He has no wheezes. He has no rales. He exhibits no tenderness.  Abdominal: Soft. Bowel sounds are normal. He exhibits no distension and no mass. There is no tenderness. There is no rebound and no guarding. A hernia is present.  Medium-large  ventral hernia  Musculoskeletal: He exhibits no edema.  Back: Inspection: No masses, or rash. There is some deformity of lower back Palpation: Lumbar midline spinal tenderness with bilateral lumbar paraspinal muscle tenderness. ROM: Deferred  Strength: 4/5 in lower extremities and normal plantar and dorsiflexion Sensation: Intact sensation with light touch in lower extremities bilaterally Gait: Deferred Reflexes: Patellar reflex is 1+ bilaterally SLR: Positive supine straight leg raise bilaterally  Left foot is more swollen than right. 1+ pitting edema. No tenderness. N/V intact  Neurological: He is alert and oriented to person, place, and time.  Skin: Skin is warm and dry.  Psychiatric: He has a normal mood and affect.  Nursing note and vitals reviewed.    ED Treatments / Results  Labs (all labs ordered are listed, but only abnormal results are displayed) Labs Reviewed - No data to display  EKG  EKG Interpretation None       Radiology Ct Lumbar Spine Wo Contrast  Result Date: 03/09/2016 CLINICAL DATA:  [place] 1 week ago.  Lumbago radiating to both hips. EXAM: CT LUMBAR SPINE WITHOUT CONTRAST TECHNIQUE: Multidetector CT imaging of the lumbar spine was performed  without intravenous contrast administration. Multiplanar CT image reconstructions were also generated. COMPARISON:  03/02/2016 radiography FINDINGS: The scan covers the region from inferior T11 through S2. Solid bridging anterior osteophytes at T11-12. No stenosis at that level. Prominent anterior osteophytes at T12-L1 but without evidence of solid union. Mild bulging of the disc. Mild facet hypertrophy. No compressive stenosis. Superior endplate compression fracture at L2 which is likely acute. Loss of height of 20% at the central superior endplate. Minimal posterior bowing of the posterior superior margin of the vertebral body but no significant encroachment upon the spinal canal or foramina. Chronic appearing spinous process abutment. L2-3: Disc degeneration with vacuum phenomenon and mild bulging of the disc. No stenosis. Chronic appearing spinous process abutment. L3-4: Minimal bulging of the disc. Mild facet and ligamentous hypertrophy. No compressive stenosis. Chronic appearing spinous process abutment. L4-5: Advanced chronic facet arthropathy with 3 mm of anterolisthesis. Disc degeneration with mild bulging of the disc. Mild foraminal encroachment on the right by osteophytes. No definite neural compression. L5-S1: Chronic fusion across the disc space. Wide patency of the canal and foramina. Right sacroiliac joint shows ankylosis. Left sacroiliac joint unremarkable. No upper sacral fracture. IMPRESSION: Acute superior endplate fracture at L2 with loss of height centrally of 20%. Minimal posterior bowing of the posterior superior margin of the L2 vertebral body but no significant encroachment upon the neural spaces. Non-compressive degenerative changes as outlined above. Facet arthropathy at L4-5 which could be associated with low back pain. Electronically Signed   By: Nelson Chimes M.D.   On: 03/09/2016 15:01    Procedures Procedures (including critical care time)  Medications Ordered in ED Medications    traMADol (ULTRAM) tablet 50 mg (50 mg Oral Given 03/09/16 1416)     Initial Impression / Assessment and Plan / ED Course  I have reviewed the triage vital signs and the nursing notes.  Pertinent labs & imaging results that were available during my care of the patient were reviewed by me and considered in my medical decision making (see chart for details).  Clinical Course   81 year old male presents with worsening back pain. CT of lumbar spine is remarkable for L2 compression fracture and degenerative changes. TLSO brace applied and rx for Tramadol given. Patient does have Tramadol at home however he states he will run out soon.  Referral to spine specialist given. Patient is NAD, non-toxic, with stable VS. Patient is informed of clinical course, understands medical decision making process, and agrees with plan. Opportunity for questions provided and all questions answered. Return precautions given.   Final Clinical Impressions(s) / ED Diagnoses   Final diagnoses:  Compression fracture of L2, closed, initial encounter [facility])    New Prescriptions Discharge Medication List as of 03/09/2016  3:40 PM    START taking these medications   Details  !! traMADol (ULTRAM) 50 MG tablet Take 1 tablet (50 mg total) by mouth every 6 (six) hours as needed., Starting Wed 03/09/2016, Print     !! - Potential duplicate medications found. Please discuss with provider.       Recardo Evangelist, PA-C 03/11/16 8590    Carmin Muskrat, MD 03/12/16 [phone]

## 2016-03-11 ENCOUNTER — Telehealth: Payer: Self-pay | Admitting: Radiation Oncology

## 2016-03-11 MED ORDER — PREDNISONE 50 MG PO TABS: ORAL_TABLET | ORAL | 1 refills | Status: DC

## 2016-03-11 MED ORDER — PREDNISONE 50 MG PO TABS: ORAL_TABLET | ORAL | 1 refills | Status: AC

## 2016-03-11 NOTE — Telephone Encounter (Signed)
I called to speak with the patient's son Rory Xiang. To speak about his sister's call to Dr. Tammi Klippel. The family is finding that the patient is unable to care for himself or wife at home despite the round the clock care he has. Apparently he fell and fractured a vertebrae in his spine and is going to see neurosurgery about this next week. The family is concerned that the patient really needs hospice or palliative care in a nursing facility along with his wife. They have not seen Dr. Marin Olp since April and would like rather to meet with Dr. Tammi Klippel after his next CT on Wednesday of this coming week to review long term options. The patient's son does not feel that aggressive measures are necessary given the patient's condition and age to move forward with repeat imaging, but states the patient feels that he needs to know his scan findings before making decisions about these issues. We will proceed with CT on Wednesday of the chest, and meet back Thursday. The patient has previously met with palliative care and I will see if it is possible for Terese Door to meet back with him that day. I will also call in premeds for scan.

## 2016-03-15 ENCOUNTER — Emergency Department (HOSPITAL_COMMUNITY)
Admission: EM | Admit: 2016-03-15 | Discharge: 2016-03-15 | Disposition: A | Discharge status: Home / Self Care | Payer: Medicare Other | Attending: Emergency Medicine | Admitting: Emergency Medicine

## 2016-03-15 ENCOUNTER — Encounter (HOSPITAL_COMMUNITY): Payer: Self-pay

## 2016-03-15 DIAGNOSIS — R42 Dizziness and giddiness: Secondary | ICD-10-CM | POA: Insufficient documentation

## 2016-03-15 DIAGNOSIS — Y929 Unspecified place or not applicable: Secondary | ICD-10-CM | POA: Diagnosis not present

## 2016-03-15 DIAGNOSIS — W19XXXA Unspecified fall, initial encounter: Secondary | ICD-10-CM | POA: Insufficient documentation

## 2016-03-15 DIAGNOSIS — R443 Hallucinations, unspecified: Secondary | ICD-10-CM | POA: Diagnosis not present

## 2016-03-15 DIAGNOSIS — Z79899 Other long term (current) drug therapy: Secondary | ICD-10-CM | POA: Insufficient documentation

## 2016-03-15 DIAGNOSIS — Z85841 Personal history of malignant neoplasm of brain: Secondary | ICD-10-CM | POA: Insufficient documentation

## 2016-03-15 DIAGNOSIS — S32020A Wedge compression fracture of second lumbar vertebra, initial encounter for closed fracture: Secondary | ICD-10-CM | POA: Diagnosis not present

## 2016-03-15 DIAGNOSIS — I2510 Atherosclerotic heart disease of native coronary artery without angina pectoris: Secondary | ICD-10-CM | POA: Diagnosis not present

## 2016-03-15 DIAGNOSIS — Y999 Unspecified external cause status: Secondary | ICD-10-CM | POA: Diagnosis not present

## 2016-03-15 DIAGNOSIS — Z87891 Personal history of nicotine dependence: Secondary | ICD-10-CM | POA: Insufficient documentation

## 2016-03-15 DIAGNOSIS — Z950 Presence of cardiac pacemaker: Secondary | ICD-10-CM | POA: Insufficient documentation

## 2016-03-15 DIAGNOSIS — E039 Hypothyroidism, unspecified: Secondary | ICD-10-CM | POA: Insufficient documentation

## 2016-03-15 DIAGNOSIS — IMO0002 Reserved for concepts with insufficient information to code with codable children: Secondary | ICD-10-CM

## 2016-03-15 DIAGNOSIS — R05 Cough: Secondary | ICD-10-CM | POA: Diagnosis not present

## 2016-03-15 DIAGNOSIS — R410 Disorientation, unspecified: Secondary | ICD-10-CM | POA: Diagnosis not present

## 2016-03-15 DIAGNOSIS — S3992XA Unspecified injury of lower back, initial encounter: Secondary | ICD-10-CM | POA: Diagnosis present

## 2016-03-15 DIAGNOSIS — Y939 Activity, unspecified: Secondary | ICD-10-CM | POA: Insufficient documentation

## 2016-03-15 HISTORY — DX: Unspecified hearing loss, unspecified ear: H91.90

## 2016-03-15 HISTORY — DX: Dorsalgia, unspecified: M54.9

## 2016-03-15 HISTORY — DX: Unspecified fall, initial encounter: W19.XXXA

## 2016-03-15 LAB — BASIC METABOLIC PANEL
ANION GAP: 7 (ref 5–15)
BUN: 24 mg/dL — ABNORMAL HIGH (ref 6–20)
CO2: 29 mmol/L (ref 22–32)
CREATININE: 0.59 mg/dL — AB (ref 0.61–1.24)
Calcium: 8.4 mg/dL — ABNORMAL LOW (ref 8.9–10.3)
Chloride: 102 mmol/L (ref 101–111)
GFR calc Af Amer: >60 mL/min (ref >60)
Glucose, Bld: 108 mg/dL — ABNORMAL HIGH (ref 65–99)
POTASSIUM: 4.2 mmol/L (ref 3.5–5.1)
SODIUM: 138 mmol/L (ref 135–145)

## 2016-03-15 MED ORDER — ACETAMINOPHEN 325 MG PO TABS
650.0000 mg | ORAL_TABLET | Freq: Once | ORAL | Status: AC
  Administered 2016-03-15: 650 mg via ORAL
  Filled 2016-03-15: qty 2

## 2016-03-15 MED ORDER — SODIUM CHLORIDE 0.9 % IV BOLUS (SEPSIS)
250.0000 mL | Freq: Once | INTRAVENOUS | Status: AC
  Administered 2016-03-15: 250 mL via INTRAVENOUS

## 2016-03-15 NOTE — ED Triage Notes (Addendum)
Per [facility]- Pt resides at home. Seen for fall 4 days ago. Treated and released. RX tramadol given. Pt reports difficulty with ambulation however only related to back pain. Pt here for uncontrolled back pain related to fall. No new symptoms.

## 2016-03-15 NOTE — ED Notes (Signed)
Pt is suppose to be in back brace however EMS brought pt from bed and currently is without brace. Pt has spinal fracture and is currently is speaking with surgeon about surgery.

## 2016-03-15 NOTE — ED Notes (Signed)
Bed: WA01 Expected date:  Expected time:  Means of arrival:  Comments: Elderly

## 2016-03-15 NOTE — ED Provider Notes (Signed)
[facility] DEPT Provider Note   CSN: [id number] Arrival date & time: 03/15/16  0908     History   Chief Complaint Chief Complaint  Patient presents with  . Back Pain  . Fall    4 days ago    HPI Bradley Nixon is a 82 y.o. male with metastatic lung cancer, CAD, and A Fib (on warfarin) with an L2 compression fracture diagnosed earlier this month who presents with persistent lower back pain.  At his ED visit on 8/16, he was discharged home with tramadol for pain from his compression fracture.  The tramadol relieves his pain well, and he is able to walk with his walker as normal when his pain is controlled.  However, he has stopped taking tramadol and night because he began to feel confused and see people who weren't there in his room.  No confusion during the day.  No weakness/numbness in legs, no urinary retention, no fecal incontinence.  Lives at home with wife, has both daytime and night aide but not full time.  He has minimal baseline fluid intake, and reports some postural dizziness.  Back Pain   Pertinent negatives include no chest pain, no fever, no numbness, no headaches, no abdominal pain, no dysuria and no weakness.  Fall  Pertinent negatives include no chest pain, no abdominal pain and no headaches.    Past Medical History:  Diagnosis Date  . Anginal pain ([place])   . Arthritis   . Back pain   . Cancer ([place])   . Complication of anesthesia hard to arouse after anesthesia  . Coronary artery disease   . Fall   . HOH (hard of hearing)   . Hypothyroidism   . Myocardial infarction ([place])   . Pacemaker 05/02/2007   [facility]  . Permanent atrial fibrillation ([place])   . Pneumonia   . Shortness of breath     Patient Active Problem List   Diagnosis Date Noted  . Brain metastases ([place]) 01/11/2016  . Lung mass   . DNR (do not resuscitate) 10/19/2015  . Palliative care encounter 10/19/2015  . Cancer of upper lobe of right lung ([place]) 08/24/2015  . Dyspnea 10/18/2013  .  Hyperlipidemia 04/17/2013  . Atrial fibrillation ([place]) 10/10/2012  . Long term current use of anticoagulant therapy 10/10/2012  . Fracture of inferior pubic ramus ([place]) 06/24/2012  . Fracture of superior pubic ramus ([place]) 06/24/2012  . Sacral fracture ([place]) 06/24/2012  . Fall 06/24/2012  . Hypothyroidism   . Pacemaker   . Myocardial infarction [facility])     Past Surgical History:  Procedure Laterality Date  . CARDIOVERSION  07/28/2011   Procedure: CARDIOVERSION;  Surgeon: Leonie Man;  Location: [facility] OR;  Service: Cardiovascular;  Laterality: N/A;  . JOINT REPLACEMENT    . NM MYOCAR PERF WALL MOTION  02/09/2010   no ischemia  . PERMANENT PACEMAKER INSERTION  05/02/2007   [facility]  . US ECHOCARDIOGRAPHY  09/17/2010   LA moderately dilated,mild TR, EF >55%       Home Medications    Prior to Admission medications   Medication Sig Start Date End Date Taking? Authorizing Provider  atorvastatin (LIPITOR) 10 MG tablet TAKE 1 TABLET EVERY EVENING Patient taking differently: TAKE 10 MG BY MOUTH EVERY EVENING 11/06/15   Mihai Croitoru, MD  finasteride (PROSCAR) 5 MG tablet Take 5 mg by mouth daily. 02/08/16   Historical Provider, MD  fish oil-omega-3 fatty acids 1000 MG capsule Take 2 g by mouth every evening.  Historical Provider, MD  Lansoprazole (PREVACID PO) Take 30 m by mouth daily. Reported on 09/03/2015    Historical Provider, MD  meclizine (ANTIVERT) 25 MG tablet Take 25 mg by mouth daily as needed for dizziness/vertigo 06/30/15   Historical Provider, MD  Multiple Vitamin (MULITIVITAMIN WITH MINERALS) TABS Take 1 tablet by mouth every evening.      Historical Provider, MD  ondansetron (ZOFRAN) 4 MG tablet TAKE 1 TABLET EVERY 8 HOURS AS NEEDED FOR NAUSEA AND VOMITING Patient taking differently: TAKE 4 MG EVERY 8 HOURS AS NEEDED FOR NAUSEA AND VOMITING 03/05/16   Tyler Pita, MD  predniSONE (DELTASONE) 50 MG tablet Take 1 tab po 13 hours prior to CT, 1 tab po 7 hours prior, and 1 tab 1  hour prior to CT. 03/11/16   Hayden Pedro, PA-C  Tamsulosin HCl (FLOMAX) 0.4 MG CAPS Take 0.8 mg by mouth daily after supper.     Historical Provider, MD  traMADol (ULTRAM) 50 MG tablet Take 100 mg by mouth every 8 (eight) hours as needed for moderate pain.     Historical Provider, MD  traMADol (ULTRAM) 50 MG tablet Take 1 tablet (50 mg total) by mouth every 6 (six) hours as needed. 03/09/16   Recardo Evangelist, PA-C  warfarin (COUMADIN) 5 MG tablet TAKE 1 TO 1 AND 1/2 TABLETS  DAILY OR AS DIRECTED BY COUMADIN CLINIC Patient taking differently: TAKE 1 TO 1 AND 1/2 TABLETS  DAILY OR AS DIRECTED BY COUMADIN CLINIC every day take 7.5 mg except Sunday and Thursday take 5 mg 07/28/15   Sanda Klein, MD    Family History Family History  Problem Relation Age of Onset  . Heart failure Mother   . Heart attack Father     Social History Social History  Substance Use Topics  . Smoking status: Former Research scientist (life sciences)  . Smokeless tobacco: Never Used  . Alcohol use No     Allergies   Iodinated diagnostic agents and Zocor [simvastatin]   Review of Systems Review of Systems  Constitutional: Negative for appetite change and fever.  HENT: Negative for sore throat and trouble swallowing.   Eyes: Negative for photophobia, pain and visual disturbance.  Respiratory: Positive for cough. Negative for wheezing.   Cardiovascular: Negative for chest pain, palpitations and leg swelling.  Gastrointestinal: Negative for abdominal pain, constipation, diarrhea, nausea and vomiting.  Genitourinary: Negative for difficulty urinating, dysuria and hematuria.  Musculoskeletal: Positive for back pain.  Neurological: Positive for dizziness. Negative for weakness, numbness and headaches.  Hematological: Bruises/bleeds easily.  Psychiatric/Behavioral: Positive for confusion, hallucinations and sleep disturbance. Negative for agitation and behavioral problems. The patient is not nervous/anxious.      Physical  Exam Updated Vital Signs BP 124/67 (BP Location: Left Arm)   Pulse 92   Temp 97.7 F (36.5 C) (Oral)   Resp 16   Ht '5\' 7"'$  (1.702 m)   Wt 68 kg   SpO2 95%   BMI 23.49 kg/m   Physical Exam  Constitutional: He is oriented to person, place, and time.  Thin elderly man lying in stretcher in no distress  HENT:  Head: Normocephalic and atraumatic.  Oropharynx dry  Eyes: Conjunctivae are normal. No scleral icterus.  Neck: Normal range of motion. Neck supple.  Cardiovascular: Normal rate, regular rhythm and intact distal pulses.   3/6 systolic murmur loudest over mitral area  Pulmonary/Chest: Effort normal. No respiratory distress.  Coarse crackles over LLL  Abdominal: Soft. He exhibits no distension. There is  no tenderness.  Musculoskeletal:  Small edematous lump over upper lumbar spine.  No bony deformity palpable  Neurological: He is alert and oriented to person, place, and time. No cranial nerve deficit. He exhibits normal muscle tone.  Strength 5/5 symmetric HF, KF, KE, DF, PF. Plantarflexion limited by pain bilaterally.  Skin: Skin is warm and dry. No rash noted. No erythema.  Psychiatric: He has a normal mood and affect. His behavior is normal.     ED Treatments / Results  Labs (all labs ordered are listed, but only abnormal results are displayed) Labs Reviewed  BASIC METABOLIC PANEL - Abnormal; Notable for the following:       Result Value   Glucose, Bld 108 (*)    BUN 24 (*)    Creatinine, Ser 0.59 (*)    Calcium 8.4 (*)    All other components within normal limits    EKG  EKG Interpretation None       Radiology No results found.  Procedures Procedures (including critical care time)  Medications Ordered in ED Medications  sodium chloride 0.9 % bolus 250 mL (not administered)     Initial Impression / Assessment and Plan / ED Course  I have reviewed the triage vital signs and the nursing notes.  Pertinent labs & imaging results that were available  during my care of the patient were reviewed by me and considered in my medical decision making (see chart for details).  Pain from previously diagnoses compression fracture.  No neurovascular compromise.  Pain well controlled on tramadol, but delerium at night.  Possible AKI with dry MM and little reported PO hydration.  Clinical Course  Comment By Time  No AKI Minus Liberty, MD 08/22 1213  Comfortable, no pain while resting after tylenol Minus Liberty, MD 08/22 1219     Final Clinical Impressions(s) / ED Diagnoses   Final diagnoses:  Compression fracture   Try to attend appointment with spine surgeon this afternoon, or reschedule for ASAP. Tylenol at night for pain instead of Tramadol to prevent delerium.  New Prescriptions New Prescriptions   No medications on file     Minus Liberty, MD 03/15/16 [place], MD 03/17/16 1021

## 2016-03-15 NOTE — Discharge Instructions (Signed)
Please go to your appointment with the spine doctor this afternoon if possible.  If you cannot make it, reschedule for ASAP.  Continue taking Tramadol for pain during the day.  Take Tylenol for pain at night instead if you continue to have confusion or see people that are not there.  Try to wear your back brace.

## 2016-03-15 NOTE — ED Notes (Signed)
Pt has no CP/SOB. Pt 02 sats 87-90 % RA. Pt is not on home oxygen.  Pt is unable to maintain >92% without oxygen. Given [facility] for support. Pt maintained 95% 02 sats 2LNC

## 2016-03-16 ENCOUNTER — Ambulatory Visit (HOSPITAL_COMMUNITY)
Admission: RE | Admit: 2016-03-16 | Discharge: 2016-03-16 | Disposition: A | Discharge status: Home / Self Care | Payer: Medicare Other | Source: Ambulatory Visit | Attending: Radiation Oncology | Admitting: Radiation Oncology

## 2016-03-16 DIAGNOSIS — J984 Other disorders of lung: Secondary | ICD-10-CM | POA: Insufficient documentation

## 2016-03-16 DIAGNOSIS — J90 Pleural effusion, not elsewhere classified: Secondary | ICD-10-CM | POA: Diagnosis not present

## 2016-03-16 DIAGNOSIS — J701 Chronic and other pulmonary manifestations due to radiation: Secondary | ICD-10-CM | POA: Diagnosis not present

## 2016-03-16 DIAGNOSIS — C3411 Malignant neoplasm of upper lobe, right bronchus or lung: Secondary | ICD-10-CM | POA: Insufficient documentation

## 2016-03-16 DIAGNOSIS — C7931 Secondary malignant neoplasm of brain: Secondary | ICD-10-CM

## 2016-03-16 DIAGNOSIS — J439 Emphysema, unspecified: Secondary | ICD-10-CM | POA: Diagnosis not present

## 2016-03-17 ENCOUNTER — Ambulatory Visit
Admission: RE | Admit: 2016-03-17 | Discharge: 2016-03-17 | Disposition: A | Discharge status: Home / Self Care | Payer: Medicare Other | Source: Ambulatory Visit | Attending: Radiation Oncology | Admitting: Radiation Oncology

## 2016-03-17 ENCOUNTER — Other Ambulatory Visit: Payer: Self-pay | Admitting: Orthopedic Surgery

## 2016-03-17 ENCOUNTER — Encounter: Payer: Self-pay | Admitting: Radiation Oncology

## 2016-03-17 VITALS — BP 124/73 | HR 91 | Temp 98.2°F | Resp 12

## 2016-03-17 DIAGNOSIS — M4856XA Collapsed vertebra, not elsewhere classified, lumbar region, initial encounter for fracture: Secondary | ICD-10-CM | POA: Insufficient documentation

## 2016-03-17 DIAGNOSIS — M549 Dorsalgia, unspecified: Secondary | ICD-10-CM | POA: Insufficient documentation

## 2016-03-17 DIAGNOSIS — M810 Age-related osteoporosis without current pathological fracture: Secondary | ICD-10-CM

## 2016-03-17 DIAGNOSIS — C3411 Malignant neoplasm of upper lobe, right bronchus or lung: Secondary | ICD-10-CM

## 2016-03-17 DIAGNOSIS — W19XXXD Unspecified fall, subsequent encounter: Secondary | ICD-10-CM

## 2016-03-17 DIAGNOSIS — J90 Pleural effusion, not elsewhere classified: Secondary | ICD-10-CM | POA: Diagnosis not present

## 2016-03-17 DIAGNOSIS — Z79899 Other long term (current) drug therapy: Secondary | ICD-10-CM | POA: Diagnosis not present

## 2016-03-17 DIAGNOSIS — Z87891 Personal history of nicotine dependence: Secondary | ICD-10-CM | POA: Diagnosis not present

## 2016-03-17 DIAGNOSIS — C7931 Secondary malignant neoplasm of brain: Secondary | ICD-10-CM | POA: Diagnosis not present

## 2016-03-17 DIAGNOSIS — I482 Chronic atrial fibrillation: Secondary | ICD-10-CM | POA: Diagnosis not present

## 2016-03-17 DIAGNOSIS — Z66 Do not resuscitate: Secondary | ICD-10-CM

## 2016-03-17 DIAGNOSIS — Z950 Presence of cardiac pacemaker: Secondary | ICD-10-CM | POA: Diagnosis not present

## 2016-03-17 DIAGNOSIS — I252 Old myocardial infarction: Secondary | ICD-10-CM | POA: Diagnosis not present

## 2016-03-17 DIAGNOSIS — Z9181 History of falling: Secondary | ICD-10-CM | POA: Diagnosis not present

## 2016-03-17 DIAGNOSIS — Z7901 Long term (current) use of anticoagulants: Secondary | ICD-10-CM | POA: Diagnosis not present

## 2016-03-17 NOTE — Progress Notes (Signed)
PAIN: He rates his pain as a 8 on a scale of 0-10. constant and sharp over mid/lower spine.  Fracture spine during a fall two weeks ago.Marland Kitchen NEURO: Pt alert & oriented x 3 with fluent speech, reports numbness to his left heel, weakness to bilateral legs, needs assistance for all transfers. 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 Pt presenting appropriate quality, quantity and organization of sentences. Pt denies headaches The patient eats a regular, healthy diet.   OTHER: Pt complains of fatigue, weakness and loss of sleep- taking pain medication to help sleep due to injury.  BP 124/73   Pulse 91   Temp 98.2 F (36.8 C) (Oral)   Resp 12   SpO2 97%  Wt Readings from Last 3 Encounters:  03/15/16 150 lb (68 kg)  03/09/16 150 lb (68 kg)  03/02/16 150 lb (68 kg)

## 2016-03-17 NOTE — Progress Notes (Signed)
Radiation Oncology         [phone] ________________________________  Name: Bradley Nixon MRN: [id number]  Date: 03/17/2016  DOB: Dec 01, 1920  Follow-Up Visit Note  CC: ROBERTS, Sharol Given, MD  Grace Isaac, MD  Diagnosis: 82 y.o. 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 Cancer of upper lobe of right lung (HCC) 162.3 C34.11   2. Brain metastases (HCC) 198.3 C79.31     Interval Since Last Radiation: 2 months  01/19/16 Brain: Right frontal 6 mm target was treated using 3 Dynamic Conformal Arcs to a prescription dose of 20 Gy.  12/07/15-12/18/15 Lung: The lung target was treated to 50 Gy in 5 fractions of 10 Gy  Narrative:  The patient returns today for routine follow-up after undergoing re-staging CT of the chest without contrast. This was performed yesterday revealing persistent large right upper lobe lung mass measuring 6 cm with changes consistent with radiation fibrosis in the right upper lobe, no mediastinal adenopathy or hilar mass with otherwise noted. Small right pleural effusion was present. In the time since he was seen by myself and Dr. Tammi Klippel, he had a fall at home, and was seen in the emergency department on 03/02/2016. No acute findings were noted of the films of his lumbar or cervical spine. CT of the brain was negative for intracranial abnormality. He fell again on 03/09/2016 and films of his lumbar spine revealed an acute compression fracture of L2. He fell again on 03/15/2016, and was again seen in the ED with followup thereafter as an outpatient with orthopedics and is scheduled for a bone scan on Monday, 8/28 with Dr. Sherlyn Lick.   He rates his pain as 8 on a scale of 0-10, which is constant and sharp over the mid/lower spine. He fractured his spine during a fall two weeks ago. They have not been visited by a palliative care nurse in their home. Reports numbness to his left heel, weakness to  bilateral legs, and needs assistant for all transfers. He denies visual blurring, double vision, eye pain. He reports he is blind in his right eye and has macular degeneration for left eye. He denies abnormal auditory changes and is hard of hearing in bilateral ears. He denies headaches. States he eats a regular, healthy diet. He complains of fatigue, weakness, and loss of sleep. He takes pain medication to help sleep due to injury. The patient is requesting a refill on tramadol. Takes as directed, 2 tablets every 2 to 3 hours as needed. No other complaints are verbalized.   Past Medical History:  Past Medical History:  Diagnosis Date  . Anginal pain ([place])   . Arthritis   . Back pain   . Cancer ([place])   . Complication of anesthesia hard to arouse after anesthesia  . Coronary artery disease   . Fall   . HOH (hard of hearing)   . Hypothyroidism   . Myocardial infarction ([place])   . Pacemaker 05/02/2007   [facility]  . Permanent atrial fibrillation ([place])   . Pneumonia   . Shortness of breath     Past Surgical History: Past Surgical History:  Procedure Laterality Date  . CARDIOVERSION  07/28/2011   Procedure: CARDIOVERSION;  Surgeon: Leonie Man;  Location: [facility] OR;  Service: Cardiovascular;  Laterality: N/A;  . JOINT REPLACEMENT    . NM MYOCAR PERF WALL MOTION  02/09/2010   no ischemia  .  PERMANENT PACEMAKER INSERTION  05/02/2007   [facility]  . US ECHOCARDIOGRAPHY  09/17/2010   LA moderately dilated,mild TR, EF >55%    Social History:  Social History   Social History  . Marital status: Married    Spouse name: N/A  . Number of children: N/A  . Years of education: N/A   Occupational History  . Not on file.   Social History Main Topics  . Smoking status: Former Research scientist (life sciences)  . Smokeless tobacco: Never Used  . Alcohol use No  . Drug use: No  . Sexual activity: Not Currently   Other Topics Concern  . Not on file   Social History Narrative  . No narrative on file    Family  History: Family History  Problem Relation Age of Onset  . Heart failure Mother   . Heart attack Father      ALLERGIES:  is allergic to iodinated diagnostic agents and zocor [simvastatin].  Meds: Current Outpatient Prescriptions  Medication Sig Dispense Refill  . atorvastatin (LIPITOR) 10 MG tablet TAKE 1 TABLET EVERY EVENING (Patient taking differently: TAKE 10 MG BY MOUTH EVERY EVENING) 90 tablet 1  . finasteride (PROSCAR) 5 MG tablet Take 5 mg by mouth daily.    . fish oil-omega-3 fatty acids 1000 MG capsule Take 2 g by mouth every evening.     . Lansoprazole (PREVACID PO) Take 30 m by mouth daily. Reported on 09/03/2015    . meclizine (ANTIVERT) 25 MG tablet Take 25 mg by mouth daily as needed for dizziness/vertigo  99  . Multiple Vitamin (MULITIVITAMIN WITH MINERALS) TABS Take 0.5 tablets by mouth 2 (two) times daily.     . ondansetron (ZOFRAN) 4 MG tablet TAKE 1 TABLET EVERY 8 HOURS AS NEEDED FOR NAUSEA AND VOMITING (Patient taking differently: TAKE 4 MG EVERY 8 HOURS AS NEEDED FOR NAUSEA AND VOMITING) 20 tablet 0  . predniSONE (DELTASONE) 50 MG tablet Take 1 tab po 13 hours prior to CT, 1 tab po 7 hours prior, and 1 tab 1 hour prior to CT. (Patient not taking: Reported on 03/17/2016) 3 tablet 1  . Tamsulosin HCl (FLOMAX) 0.4 MG CAPS Take 0.4 mg by mouth daily after supper.     . traMADol (ULTRAM) 50 MG tablet Take 1 tablet (50 mg total) by mouth every 6 (six) hours as needed. (Patient taking differently: Take 100 mg by mouth every 6 (six) hours as needed for moderate pain. ) 15 tablet 0  . warfarin (COUMADIN) 5 MG tablet TAKE 1 TO 1 AND 1/2 TABLETS  DAILY OR AS DIRECTED BY COUMADIN CLINIC (Patient taking differently: Take '5mg'$  daily except on Monday,Wednesday, Friday, and Saturday take 7.5 mg daily at night) 135 tablet 1   No current facility-administered medications for this encounter.     Physical Findings:  oral temperature is 98.2 F (36.8 C). His blood pressure is 124/73 and his  pulse is 91. His respiration is 12 and oxygen saturation is 97%.   In general this is an elderly, frail, and chronically ill appearing caucasian male in no acute distress. He's alert and oriented x4 and appropriate throughout the examination. Cardiopulmonary assessment is negative for acute distress and he exhibits normal effort. He is wearing a lumbar brace and evaluation of his spine is deferred to orthopedics.   Lab Findings: Lab Results  Component Value Date   WBC 7.1 03/02/2016   HGB 11.3 (L) 03/02/2016   HGB 12.5 (L) 09/03/2015   HCT 35.1 (L) 03/02/2016  HCT 38.0 (L) 09/03/2015   PLT 179 03/02/2016   PLT 198 09/03/2015    Lab Results  Component Value Date   NA 138 03/15/2016   NA 140 12/28/2015   K 4.2 03/15/2016   K 4.4 12/28/2015   CHLORIDE 108 12/28/2015   CO2 29 03/15/2016   CO2 27 12/28/2015   GLUCOSE 108 (H) 03/15/2016   GLUCOSE 97 12/28/2015   BUN 24 (H) 03/15/2016   BUN 21.8 12/28/2015   CREATININE 0.59 (L) 03/15/2016   CREATININE 0.8 12/28/2015   BILITOT 0.58 09/03/2015   ALKPHOS 68 09/03/2015   AST 21 09/03/2015   ALT 26 09/03/2015   PROT 7.1 09/03/2015   ALBUMIN 3.4 (L) 09/03/2015   CALCIUM 8.4 (L) 03/15/2016   CALCIUM 8.8 12/28/2015   ANIONGAP 7 03/15/2016    Radiographic Findings: Dg Lumbar Spine Complete  Result Date: 03/02/2016 CLINICAL DATA:  Fall at home today.  Back pain. EXAM: LUMBAR SPINE - COMPLETE 4+ VIEW COMPARISON:  None. FINDINGS: No acute fracture line or compression deformity is noted. Diffuse degenerative disc narrowing with bulky spurring at T12-L1 and L4-5. Severe L4-5 facet arthropathy. Osteopenia. No evidence of focal bone lesion or endplate erosion. Remote and healed right obturator ring fracture. IMPRESSION: No acute finding. Electronically Signed   By: Monte Fantasia M.D.   On: 03/02/2016 13:37   Ct Head Wo Contrast  Result Date: 03/02/2016 CLINICAL DATA:  Fall at home with occipital injury. Initial encounter. EXAM: CT HEAD  WITHOUT CONTRAST CT CERVICAL SPINE WITHOUT CONTRAST TECHNIQUE: Multidetector CT imaging of the head and cervical spine was performed following the standard protocol without intravenous contrast. Multiplanar CT image reconstructions of the cervical spine were also generated. COMPARISON:  Head CT 01/01/2016 FINDINGS: CT HEAD FINDINGS Brain: No evidence of acute infarction, hemorrhage, hydrocephalus. Cortical and subcortical low-density in the right peri-rolandic region is mildly progressed from prior. No hemorrhage. No mass effect. Generalized cerebral volume loss, stable. Remote small vessel infarcts in the left putamen and caudate nucleus. Vascular: Atherosclerotic calcifications. Skull: Negative for fracture or focal lesion. Sinuses/Orbits: Bilateral cataract resection. Other: None. CT CERVICAL SPINE FINDINGS Alignment: C5-6 and C7-T1 anterolisthesis is likely facet mediated. Skull base and vertebrae: No acute fracture. Intervertebral ankylosis from C3-C5. Sub cm sclerotic focus in the lower T1 body is stable since 08/21/2015. No noted bone metastases on 09/11/2015 PET CT. Soft tissues and canal: No prevertebral fluid. No gross canal hematoma. Degenerative: Diffuse degenerative disc narrowing and arthropathy. IMPRESSION: 1. No evidence of acute intracranial or cervical spine injury. 2. Mild increase in white matter low-density at the treated posterior right frontal metastasis. No mass effect. Electronically Signed   By: Monte Fantasia M.D.   On: 03/02/2016 14:09   Ct Chest Wo Contrast  Result Date: 03/16/2016 CLINICAL DATA:  Restaging right lung cancer. Initial diagnosis January 2017. Finished radiation therapy 12/18/2015 EXAM: CT CHEST WITHOUT CONTRAST TECHNIQUE: Multidetector CT imaging of the chest was performed following the standard protocol without IV contrast. COMPARISON:  PET-CT 09/11/2015 and chest CT 08/21/2015 FINDINGS: Chest wall: No chest wall mass, supraclavicular or axillary lymphadenopathy.  Moderate artifact from a right-sided permanent pacemaker. Cardiovascular: The heart is normal in size. No pericardial effusion. Stable aortic calcifications and tortuosity. Stable coronary artery calcifications. Mediastinum/Nodes: Stable small scattered mediastinal and hilar lymph nodes. No overt lymphadenopathy. The esophagus is grossly normal. Lungs/Pleura: Stable 6 cm right upper lobe lung mass unchanged since the PET-CT. There are significant surrounding radiation changes involving the right upper lobe. Stable  underline emphysematous changes. No findings to suggest pulmonary metastatic disease. Very small right pleural effusion. Upper Abdomen: Stable cyst at the hepatic dome. No adrenal gland lesions. Musculoskeletal: No significant bony findings. Stable osteoporosis and probable changes of ankylosing spondylitis. IMPRESSION: 1. Persistent large right upper lobe lung mass measuring 6 cm. Findings suggest a poor response to radiation therapy. There are changes of radiation fibrosis in the right upper lobe. 2. No mediastinal or hilar mass or adenopathy and no findings for pulmonary metastatic disease. 3. Emphysematous changes and pulmonary scarring. Small right pleural effusion. 4. No findings for upper abdominal metastatic disease. Electronically Signed   By: Marijo Sanes M.D.   On: 03/16/2016 14:16   Ct Cervical Spine Wo Contrast  Result Date: 03/02/2016 CLINICAL DATA:  Fall at home with occipital injury. Initial encounter. EXAM: CT HEAD WITHOUT CONTRAST CT CERVICAL SPINE WITHOUT CONTRAST TECHNIQUE: Multidetector CT imaging of the head and cervical spine was performed following the standard protocol without intravenous contrast. Multiplanar CT image reconstructions of the cervical spine were also generated. COMPARISON:  Head CT 01/01/2016 FINDINGS: CT HEAD FINDINGS Brain: No evidence of acute infarction, hemorrhage, hydrocephalus. Cortical and subcortical low-density in the right peri-rolandic region is  mildly progressed from prior. No hemorrhage. No mass effect. Generalized cerebral volume loss, stable. Remote small vessel infarcts in the left putamen and caudate nucleus. Vascular: Atherosclerotic calcifications. Skull: Negative for fracture or focal lesion. Sinuses/Orbits: Bilateral cataract resection. Other: None. CT CERVICAL SPINE FINDINGS Alignment: C5-6 and C7-T1 anterolisthesis is likely facet mediated. Skull base and vertebrae: No acute fracture. Intervertebral ankylosis from C3-C5. Sub cm sclerotic focus in the lower T1 body is stable since 08/21/2015. No noted bone metastases on 09/11/2015 PET CT. Soft tissues and canal: No prevertebral fluid. No gross canal hematoma. Degenerative: Diffuse degenerative disc narrowing and arthropathy. IMPRESSION: 1. No evidence of acute intracranial or cervical spine injury. 2. Mild increase in white matter low-density at the treated posterior right frontal metastasis. No mass effect. Electronically Signed   By: Monte Fantasia M.D.   On: 03/02/2016 14:09   Ct Lumbar Spine Wo Contrast  Result Date: 03/09/2016 CLINICAL DATA:  [place] 1 week ago.  Lumbago radiating to both hips. EXAM: CT LUMBAR SPINE WITHOUT CONTRAST TECHNIQUE: Multidetector CT imaging of the lumbar spine was performed without intravenous contrast administration. Multiplanar CT image reconstructions were also generated. COMPARISON:  03/02/2016 radiography FINDINGS: The scan covers the region from inferior T11 through S2. Solid bridging anterior osteophytes at T11-12. No stenosis at that level. Prominent anterior osteophytes at T12-L1 but without evidence of solid union. Mild bulging of the disc. Mild facet hypertrophy. No compressive stenosis. Superior endplate compression fracture at L2 which is likely acute. Loss of height of 20% at the central superior endplate. Minimal posterior bowing of the posterior superior margin of the vertebral body but no significant encroachment upon the spinal canal or  foramina. Chronic appearing spinous process abutment. L2-3: Disc degeneration with vacuum phenomenon and mild bulging of the disc. No stenosis. Chronic appearing spinous process abutment. L3-4: Minimal bulging of the disc. Mild facet and ligamentous hypertrophy. No compressive stenosis. Chronic appearing spinous process abutment. L4-5: Advanced chronic facet arthropathy with 3 mm of anterolisthesis. Disc degeneration with mild bulging of the disc. Mild foraminal encroachment on the right by osteophytes. No definite neural compression. L5-S1: Chronic fusion across the disc space. Wide patency of the canal and foramina. Right sacroiliac joint shows ankylosis. Left sacroiliac joint unremarkable. No upper sacral fracture. IMPRESSION: Acute superior endplate  fracture at L2 with loss of height centrally of 20%. Minimal posterior bowing of the posterior superior margin of the L2 vertebral body but no significant encroachment upon the neural spaces. Non-compressive degenerative changes as outlined above. Facet arthropathy at L4-5 which could be associated with low back pain. Electronically Signed   By: Nelson Chimes M.D.   On: 03/09/2016 15:01    Impression/Plan: 1. Putative Stage IV T2b, N0, M1 NSCLC with brain metastases. Dr. Tammi Klippel and I have reviewed his most recent CT imaging since completing radiotherapy. Patient appears to have stable disease on CT in comparison to his films from January. Although the report was read as persistent disease similar in size to pretreatment scan, the patient did have a delay in treatment, and a CXR during this time showed the tumor's growth prior to treatment, and rather we do see a response to radiotherapy. We spent time discussing however that he still has a Stage IV lung cancer and because of his comorbidities and fragile state, he was not felt to be a candidate for chemotherapy by medical oncology, and although we have some local control of his primary tumor, it is difficult to  say how long this will be and whether other sites of disease involving other systems would begin to become a problem. Rather, Dr. Tammi Klippel discusses with the patient and family, that he would be a better candidate for continued comfort care under hospice. The patient and his family agree to this, but at the same time, are interested in proceeding with a procedure to alleviate the patient's back pain from his compression fracture. We discussed a referral to palliative care and will consider transitioning to hospice once he has completed any invasive procedures for palliation of his compression fracture. 2. Back pain. Again, we discussed options for kyphoplasty with radiology. The patient was given printed information about this procedure. We have provided a new prescription for Tramadol and I have called this to his pharmacy and would be happy to follow up by phone regarding referrals. 3.  Multiple falls. The patient will follow up with orthopedics as outlined above. Again his issues appear multifactorial, his medication list was reviewed and it appears his medications are optimized, the only consideration we would have is for his flomax, but this could be important rather for quality of life and not the sole contributor to his falls. 4.  Goals of care. As above we will contact palliative care and explain the nature of the referral and the need for transition to hospice care following any procedure for his back pain.  In a visit lasting 60 minutes, greater than 50% of the time was spent coordinating his care.   The above documentation reflects my direct findings during this shared patient visit. Please see the separate note by Dr. Tammi Klippel on this date for the remainder of the patient's plan of care.     Carola Rhine, PAC   This document serves as a record of services personally performed by Tyler Pita, MD and [name], PAC. It was created on their behalf by Bethann Humble, a trained  medical scribe. The creation of this record is based on the scribe's personal observations and the provider's statements to them. This document has been checked and approved by the attending provider.

## 2016-03-29 ENCOUNTER — Ambulatory Visit
Admission: RE | Admit: 2016-03-29 | Discharge: 2016-03-29 | Disposition: A | Discharge status: Home / Self Care | Payer: Medicare Other | Source: Ambulatory Visit | Attending: Orthopedic Surgery | Admitting: Orthopedic Surgery

## 2016-03-29 DIAGNOSIS — M810 Age-related osteoporosis without current pathological fracture: Secondary | ICD-10-CM

## 2016-03-31 ENCOUNTER — Ambulatory Visit (INDEPENDENT_AMBULATORY_CARE_PROVIDER_SITE_OTHER): Payer: Medicare Other | Admitting: Cardiovascular Disease

## 2016-03-31 ENCOUNTER — Ambulatory Visit (INDEPENDENT_AMBULATORY_CARE_PROVIDER_SITE_OTHER): Payer: Medicare Other | Admitting: Pharmacist

## 2016-03-31 ENCOUNTER — Encounter: Payer: Self-pay | Admitting: Cardiovascular Disease

## 2016-03-31 VITALS — BP 100/62 | HR 88 | Ht 67.0 in | Wt 153.6 lb

## 2016-03-31 DIAGNOSIS — Z7901 Long term (current) use of anticoagulants: Secondary | ICD-10-CM | POA: Diagnosis not present

## 2016-03-31 DIAGNOSIS — Z950 Presence of cardiac pacemaker: Secondary | ICD-10-CM

## 2016-03-31 DIAGNOSIS — I4891 Unspecified atrial fibrillation: Secondary | ICD-10-CM

## 2016-03-31 DIAGNOSIS — I482 Chronic atrial fibrillation, unspecified: Secondary | ICD-10-CM

## 2016-03-31 LAB — POCT INR: INR: 2.5

## 2016-03-31 NOTE — Progress Notes (Signed)
Patient ID: Bradley Nixon, male   DOB: 04/06/1921, 82 y.o.   MRN: [id number]    Cardiology Office Note    Date:  04/02/2016   ID:  JAHAZIEL FRANCOIS, DOB 12-23-1920, MRN [id number]  PCP:  Myriam Jacobson, MD  Cardiologist:   Sanda Klein, MD   Chief Complaint  Patient presents with  . Follow-up    History of Present Illness:  Bradley Nixon is a 82 y.o. male with permanent atrial fibrillation and remote history of transient ischemic attack who returns for pacemaker check. He is on chronic warfarin anticoagulation.   His pacemaker is approaching elective replacement indicator (anticipated 0.25-0.75 years generator longevity). His dual-chamber St. Jude Zephyr was implanted initially for sinus node dysfunction, but he is now n permanent atrial fibrillation and requires pacing only 2.3% of the time. Rate control is appropriate.The ventricular lead has high pacing thresholds and mediocre sensing.  He has been diagnosed with a large right lung mass, hypermetabolic on PET scan, presumed to be primary lung cancer for which he received radiation therapy. Unfortunately, his pacemaker overlies part of the right upper lobe. Also had a brain lesion, presumably metastasis, that has responded well to radiation, according to [facility] (although I do not see any repeat imaging studies).  Iris and Quade celebrated their 71st wedding anniversary last March.  Roughly 15 years ago when he had complete expressive aphasia and right-sided hemiparesis for about 12 hours. His neurological symptoms resolved spontaneously. He had normal coronary arteries by angiography in 2009. Echo in June 2015 showed EF 55-60%, dilated LA. He had a low risk nuclear stress test in February 2016  Past Medical History:  Diagnosis Date  . Anginal pain ([place])   . Arthritis   . Back pain   . Cancer ([place])   . Complication of anesthesia hard to arouse after anesthesia  . Coronary artery disease   . Fall   . HOH (hard of hearing)   .  Hypothyroidism   . Myocardial infarction ([place])   . Pacemaker 05/02/2007   St.Jude  . Permanent atrial fibrillation ([place])   . Pneumonia   . Shortness of breath     Past Surgical History:  Procedure Laterality Date  . CARDIOVERSION  07/28/2011   Procedure: CARDIOVERSION;  Surgeon: Leonie Man;  Location: [facility] OR;  Service: Cardiovascular;  Laterality: N/A;  . JOINT REPLACEMENT    . NM MYOCAR PERF WALL MOTION  02/09/2010   no ischemia  . PERMANENT PACEMAKER INSERTION  05/02/2007   St.Jude  . US ECHOCARDIOGRAPHY  09/17/2010   LA moderately dilated,mild TR, EF >55%    Outpatient Medications Prior to Visit  Medication Sig Dispense Refill  . atorvastatin (LIPITOR) 10 MG tablet TAKE 1 TABLET EVERY EVENING (Patient taking differently: TAKE 10 MG BY MOUTH EVERY EVENING) 90 tablet 1  . finasteride (PROSCAR) 5 MG tablet Take 5 mg by mouth daily.    . fish oil-omega-3 fatty acids 1000 MG capsule Take 2 g by mouth every evening.     . Lansoprazole (PREVACID PO) Take 30 m by mouth daily. Reported on 09/03/2015    . meclizine (ANTIVERT) 25 MG tablet Take 25 mg by mouth daily as needed for dizziness/vertigo  99  . Multiple Vitamin (MULITIVITAMIN WITH MINERALS) TABS Take 0.5 tablets by mouth 2 (two) times daily.     . ondansetron (ZOFRAN) 4 MG tablet TAKE 1 TABLET EVERY 8 HOURS AS NEEDED FOR NAUSEA AND VOMITING (Patient taking differently: TAKE 4 MG  EVERY 8 HOURS AS NEEDED FOR NAUSEA AND VOMITING) 20 tablet 0  . predniSONE (DELTASONE) 50 MG tablet Take 1 tab po 13 hours prior to CT, 1 tab po 7 hours prior, and 1 tab 1 hour prior to CT. 3 tablet 1  . Tamsulosin HCl (FLOMAX) 0.4 MG CAPS Take 0.4 mg by mouth daily after supper.     . traMADol (ULTRAM) 50 MG tablet Take 1 tablet (50 mg total) by mouth every 6 (six) hours as needed. (Patient taking differently: Take 100 mg by mouth every 6 (six) hours as needed for moderate pain. ) 15 tablet 0  . warfarin (COUMADIN) 5 MG tablet TAKE 1 TO 1 AND 1/2 TABLETS   DAILY OR AS DIRECTED BY COUMADIN CLINIC (Patient taking differently: Take '5mg'$  daily except on Monday,Wednesday, Friday, and Saturday take 7.5 mg daily at night) 135 tablet 1   No facility-administered medications prior to visit.      Allergies:   Iodinated diagnostic agents and Zocor [simvastatin]   Social History   Social History  . Marital status: Married    Spouse name: N/A  . Number of children: N/A  . Years of education: N/A   Social History Main Topics  . Smoking status: Former Research scientist (life sciences)  . Smokeless tobacco: Never Used  . Alcohol use No  . Drug use: No  . Sexual activity: Not Currently   Other Topics Concern  . None   Social History Narrative  . None     Family History:  The patient's family history includes Heart attack in his father; Heart failure in his mother.   ROS:   Please see the history of present illness.    ROS All other systems reviewed and are negative.   PHYSICAL EXAM:   VS:  BP 100/62 (BP Location: Left Arm, Patient Position: Sitting, Cuff Size: Normal)   Pulse 88   Ht '5\' 7"'$  (1.702 m)   Wt 153 lb 9.6 oz (69.7 kg)   SpO2 97%   BMI 24.06 kg/m    GEN: Well nourished, well developed, in no acute distress  HEENT: normal  Neck: no JVD, carotid bruits, or masses Cardiac: Irregular rhythm, wide split second heart sound; no murmurs, rubs, or gallops,no edema  Respiratory:  clear to auscultation bilaterally, normal work of breathing. Healthy subclavian pacemaker site GI: soft, nontender, nondistended, + BS MS: no deformity or atrophy  Skin: warm and dry, no rash Neuro:  Alert and Oriented x 3, Strength and sensation are intact Psych: euthymic mood, full affect  Wt Readings from Last 3 Encounters:  03/31/16 153 lb 9.6 oz (69.7 kg)  03/15/16 150 lb (68 kg)  03/09/16 150 lb (68 kg)      Studies/Labs Reviewed:   EKG:  EKG is ordered today.  The ekg ordered today demonstrates atrial fibrillation and right bundle branch block. No ventricular pacing  is seen  Recent Labs: 09/03/2015: ALT 26 03/02/2016: Hemoglobin 11.3; Platelets 179 03/15/2016: BUN 24; Creatinine, Ser 0.59; Potassium 4.2; Sodium 138   Lipid Panel    Component Value Date/Time   CHOL  11/18/2007 0930    135        ATP III CLASSIFICATION:  <200     mg/dL   Desirable  200-239  mg/dL   Borderline High  >=240    mg/dL   High   TRIG 102 11/18/2007 0930   HDL 38 (L) 11/18/2007 0930   CHOLHDL 3.6 11/18/2007 0930   VLDL 20 11/18/2007 0930  [place]  11/18/2007 0930    77        Total Cholesterol/HDL:CHD Risk Coronary Heart Disease Risk Table                     Men   Women  1/2 Average Risk   3.4   3.3    Additional studies/ records that were reviewed today include:  Notes from radiation oncology  ASSESSMENT:    1. Chronic atrial fibrillation ([place])   2. Long term current use of anticoagulant therapy   3. Pacemaker      PLAN:  In order of problems listed above:  1. AFib: Appropriate rate control without any AV nodal blocking agents. On warfarin anticoagulation Due to previous history of stroke ([facility]ADSVasc 4). 2. Warfarin: No bleeding complications, no recent falls 3. PM:The device was implanted for sinus node dysfunction and he now has permanent atrial fibrillation. He does have right bundle branch block and relatively slow AV conduction, but there has never been evidence of significant high-grade AV block. It appears to me that he really does not need pacemaker therapy. I intend to recommend watchful waiting when his device reaches elective replacement indicator and I will not replace the generator. In fact, if his radiation oncologist thinks that he would benefit from further radiation to the right upper lobe and he thinks removal of the pacemaker would help, I would advocate removing his device.    Medication Adjustments/Labs and Tests Ordered: Current medicines are reviewed at length with the patient today.  Concerns regarding medicines are outlined above.   Medication changes, Labs and Tests ordered today are listed in the Patient Instructions below. Patient Instructions  Dr Sallyanne Kuster recommends that you schedule a follow-up appointment in 6 months with a pacemaker check. You will receive a reminder letter in the mail two months in advance. If you don't receive a letter, please call our office to schedule the follow-up appointment.  If you need a refill on your cardiac medications before your next appointment, please call your pharmacy.      Signed, Sanda Klein, MD  04/02/2016 4:15 PM    [facility] [address], [place], [place]  [address] Phone: [phone]; Fax: [phone]

## 2016-03-31 NOTE — Patient Instructions (Addendum)
Dr Sallyanne Kuster recommends that you schedule a follow-up appointment in 6 months with a pacemaker check. You will receive a reminder letter in the mail two months in advance. If you don't receive a letter, please call our office to schedule the follow-up appointment.  If you need a refill on your cardiac medications before your next appointment, please call your pharmacy.

## 2016-04-01 ENCOUNTER — Other Ambulatory Visit (HOSPITAL_COMMUNITY): Payer: Self-pay | Admitting: Orthopedic Surgery

## 2016-04-01 DIAGNOSIS — M8008XA Age-related osteoporosis with current pathological fracture, vertebra(e), initial encounter for fracture: Secondary | ICD-10-CM

## 2016-04-05 ENCOUNTER — Other Ambulatory Visit: Payer: Self-pay | Admitting: Radiation Oncology

## 2016-04-05 DIAGNOSIS — C3411 Malignant neoplasm of upper lobe, right bronchus or lung: Secondary | ICD-10-CM

## 2016-04-05 LAB — CUP PACEART INCLINIC DEVICE CHECK
Battery Remaining Longevity: 3 mo
Battery Voltage: 2.67 V
Implantable Lead Implant Date: 20081008
Implantable Lead Location: 753859
Implantable Lead Location: 753860
Lead Channel Pacing Threshold Amplitude: 5.5 V
Lead Channel Sensing Intrinsic Amplitude: 2 mV
Lead Channel Setting Pacing Amplitude: 6.5 V
Lead Channel Setting Pacing Pulse Width: 1.5 ms
MDC IDC LEAD IMPLANT DT: 20081008
MDC IDC MSMT LEADCHNL RV IMPEDANCE VALUE: 434 Ohm
MDC IDC MSMT LEADCHNL RV PACING THRESHOLD PULSEWIDTH: 1.5 ms
MDC IDC PG SERIAL: 1984739
MDC IDC SESS DTM: 20170912092135
MDC IDC SET LEADCHNL RV SENSING SENSITIVITY: 2 mV
MDC IDC STAT BRADY RV PERCENT PACED: 2.3 %

## 2016-04-06 ENCOUNTER — Encounter: Payer: Self-pay | Admitting: Cardiovascular Disease

## 2016-04-07 ENCOUNTER — Ambulatory Visit (HOSPITAL_COMMUNITY): Payer: Medicare Other | Attending: Orthopedic Surgery

## 2016-04-07 ENCOUNTER — Other Ambulatory Visit (HOSPITAL_COMMUNITY): Payer: Self-pay | Admitting: Interventional Radiology

## 2016-04-07 ENCOUNTER — Encounter (HOSPITAL_COMMUNITY): Admission: RE | Admit: 2016-04-07 | Payer: Medicare Other | Source: Ambulatory Visit

## 2016-04-07 DIAGNOSIS — IMO0002 Reserved for concepts with insufficient information to code with codable children: Secondary | ICD-10-CM

## 2016-04-08 ENCOUNTER — Other Ambulatory Visit (HOSPITAL_COMMUNITY): Payer: Self-pay | Admitting: Interventional Radiology

## 2016-04-08 ENCOUNTER — Telehealth: Payer: Self-pay | Admitting: Radiation Oncology

## 2016-04-08 DIAGNOSIS — IMO0002 Reserved for concepts with insufficient information to code with codable children: Secondary | ICD-10-CM

## 2016-04-08 DIAGNOSIS — M545 Low back pain, unspecified: Secondary | ICD-10-CM

## 2016-04-08 NOTE — Telephone Encounter (Signed)
Pete Glatter, Brooke Bonito. Calling on behalf of his father to make sure that he has an appt with Dr. Estanislado Pandy. I did see an appt for 9/26 with IR and have called and left a message for Anderson Malta, scheduler for Dr. Estanislado Pandy. She should be in touch with Mr. Seto son to confirm this appt.

## 2016-04-18 ENCOUNTER — Other Ambulatory Visit: Payer: Self-pay | Admitting: Radiology

## 2016-04-19 ENCOUNTER — Encounter (HOSPITAL_COMMUNITY): Payer: Self-pay

## 2016-04-19 ENCOUNTER — Ambulatory Visit (HOSPITAL_COMMUNITY)
Admission: RE | Admit: 2016-04-19 | Discharge: 2016-04-19 | Disposition: A | Discharge status: Home / Self Care | Payer: Medicare Other | Source: Ambulatory Visit | Attending: Interventional Radiology | Admitting: Interventional Radiology

## 2016-04-19 ENCOUNTER — Other Ambulatory Visit (HOSPITAL_COMMUNITY): Payer: Self-pay | Admitting: Interventional Radiology

## 2016-04-19 ENCOUNTER — Ambulatory Visit (HOSPITAL_COMMUNITY): Payer: Medicare Other

## 2016-04-19 DIAGNOSIS — M545 Low back pain, unspecified: Secondary | ICD-10-CM

## 2016-04-19 DIAGNOSIS — Z950 Presence of cardiac pacemaker: Secondary | ICD-10-CM | POA: Insufficient documentation

## 2016-04-19 DIAGNOSIS — I252 Old myocardial infarction: Secondary | ICD-10-CM | POA: Diagnosis not present

## 2016-04-19 DIAGNOSIS — M4856XA Collapsed vertebra, not elsewhere classified, lumbar region, initial encounter for fracture: Secondary | ICD-10-CM | POA: Diagnosis present

## 2016-04-19 DIAGNOSIS — M1990 Unspecified osteoarthritis, unspecified site: Secondary | ICD-10-CM | POA: Diagnosis not present

## 2016-04-19 DIAGNOSIS — Z85118 Personal history of other malignant neoplasm of bronchus and lung: Secondary | ICD-10-CM | POA: Diagnosis not present

## 2016-04-19 DIAGNOSIS — Z8249 Family history of ischemic heart disease and other diseases of the circulatory system: Secondary | ICD-10-CM | POA: Insufficient documentation

## 2016-04-19 DIAGNOSIS — Z87891 Personal history of nicotine dependence: Secondary | ICD-10-CM | POA: Insufficient documentation

## 2016-04-19 DIAGNOSIS — Z9181 History of falling: Secondary | ICD-10-CM | POA: Insufficient documentation

## 2016-04-19 DIAGNOSIS — IMO0002 Reserved for concepts with insufficient information to code with codable children: Secondary | ICD-10-CM

## 2016-04-19 DIAGNOSIS — I482 Chronic atrial fibrillation: Secondary | ICD-10-CM | POA: Diagnosis not present

## 2016-04-19 DIAGNOSIS — I2510 Atherosclerotic heart disease of native coronary artery without angina pectoris: Secondary | ICD-10-CM | POA: Diagnosis not present

## 2016-04-19 DIAGNOSIS — Z91041 Radiographic dye allergy status: Secondary | ICD-10-CM | POA: Diagnosis not present

## 2016-04-19 DIAGNOSIS — W19XXXA Unspecified fall, initial encounter: Secondary | ICD-10-CM | POA: Insufficient documentation

## 2016-04-19 DIAGNOSIS — S32029A Unspecified fracture of second lumbar vertebra, initial encounter for closed fracture: Secondary | ICD-10-CM | POA: Insufficient documentation

## 2016-04-19 HISTORY — PX: IR GENERIC HISTORICAL: IMG1180011

## 2016-04-19 LAB — CBC
HEMATOCRIT: 38.1 % — AB (ref 39.0–52.0)
HEMOGLOBIN: 11.7 g/dL — AB (ref 13.0–17.0)
MCH: 29.8 pg (ref 26.0–34.0)
MCHC: 30.7 g/dL (ref 30.0–36.0)
MCV: 97.2 fL (ref 78.0–100.0)
Platelets: 188 10*3/uL (ref 150–400)
RBC: 3.92 MIL/uL — AB (ref 4.22–5.81)
RDW: 15 % (ref 11.5–15.5)
WBC: 6.7 10*3/uL (ref 4.0–10.5)

## 2016-04-19 LAB — BASIC METABOLIC PANEL
ANION GAP: 9 (ref 5–15)
BUN: 19 mg/dL (ref 6–20)
CHLORIDE: 106 mmol/L (ref 101–111)
CO2: 26 mmol/L (ref 22–32)
Calcium: 8.8 mg/dL — ABNORMAL LOW (ref 8.9–10.3)
Creatinine, Ser: 0.75 mg/dL (ref 0.61–1.24)
GFR calc non Af Amer: >60 mL/min (ref >60)
GLUCOSE: 112 mg/dL — AB (ref 65–99)
POTASSIUM: 4 mmol/L (ref 3.5–5.1)
Sodium: 141 mmol/L (ref 135–145)

## 2016-04-19 LAB — PROTIME-INR
INR: 1.22
PROTHROMBIN TIME: 15.4 s — AB (ref 11.4–15.2)

## 2016-04-19 LAB — APTT: aPTT: 31 seconds (ref 24–36)

## 2016-04-19 MED ORDER — CEFAZOLIN SODIUM-DEXTROSE 2-4 GM/100ML-% IV SOLN
INTRAVENOUS | Status: AC
  Filled 2016-04-19: qty 100

## 2016-04-19 MED ORDER — FENTANYL CITRATE (PF) 100 MCG/2ML IJ SOLN
INTRAMUSCULAR | Status: AC
  Filled 2016-04-19: qty 4

## 2016-04-19 MED ORDER — BUPIVACAINE HCL (PF) 0.5 % IJ SOLN
INTRAMUSCULAR | Status: AC | PRN
  Administered 2016-04-19: 10 mL

## 2016-04-19 MED ORDER — MIDAZOLAM HCL 2 MG/2ML IJ SOLN
INTRAMUSCULAR | Status: AC | PRN
  Administered 2016-04-19 (×2): 0.5 mg via INTRAVENOUS

## 2016-04-19 MED ORDER — DIPHENHYDRAMINE HCL 25 MG PO CAPS
50.0000 mg | ORAL_CAPSULE | ORAL | Status: AC
  Administered 2016-04-19: 50 mg via ORAL

## 2016-04-19 MED ORDER — IOPAMIDOL (ISOVUE-300) INJECTION 61%
INTRAVENOUS | Status: AC
  Administered 2016-04-19: 3 mL
  Filled 2016-04-19: qty 50

## 2016-04-19 MED ORDER — CEFAZOLIN SODIUM-DEXTROSE 2-4 GM/100ML-% IV SOLN
2.0000 g | INTRAVENOUS | Status: AC
  Administered 2016-04-19: 2 g via INTRAVENOUS

## 2016-04-19 MED ORDER — FENTANYL CITRATE (PF) 100 MCG/2ML IJ SOLN
INTRAMUSCULAR | Status: AC | PRN
  Administered 2016-04-19 (×2): 12.5 ug via INTRAVENOUS

## 2016-04-19 MED ORDER — BUPIVACAINE HCL (PF) 0.25 % IJ SOLN
INTRAMUSCULAR | Status: AC
  Filled 2016-04-19: qty 30

## 2016-04-19 MED ORDER — HYDROMORPHONE HCL 1 MG/ML IJ SOLN
INTRAMUSCULAR | Status: AC
  Filled 2016-04-19: qty 1

## 2016-04-19 MED ORDER — TOBRAMYCIN SULFATE 1.2 G IJ SOLR
INTRAMUSCULAR | Status: AC
  Administered 2016-04-19: 0.1 g
  Filled 2016-04-19: qty 1.2

## 2016-04-19 MED ORDER — SODIUM CHLORIDE 0.9 % IV SOLN: INTRAVENOUS | Status: DC

## 2016-04-19 MED ORDER — DIPHENHYDRAMINE HCL 25 MG PO CAPS
ORAL_CAPSULE | ORAL | Status: AC
  Filled 2016-04-19: qty 2

## 2016-04-19 MED ORDER — SODIUM CHLORIDE 0.9 % IV SOLN
INTRAVENOUS | Status: AC | PRN
  Administered 2016-04-19: 10 mL/h via INTRAVENOUS

## 2016-04-19 MED ORDER — MIDAZOLAM HCL 2 MG/2ML IJ SOLN
INTRAMUSCULAR | Status: AC
  Filled 2016-04-19: qty 4

## 2016-04-19 NOTE — Discharge Instructions (Signed)
1 .Nostooping,bending orlifting more than 10 lbs for 2 weeks. 2.Use walker to ambulate for  2 weeks. 3.RTC in 2 weeks PRN  KYPHOPLASTY/VERTEBROPLASTY DISCHARGE INSTRUCTIONS  Medications: (check all that apply)     Resume all home medications as before procedure.       Resume your Coumadin as directed.                Continue your pain medications as prescribed as needed.  Over the next 3-5 days, decrease your pain medication as tolerated.  Over the counter medications (i.e. Tylenol, ibuprofen, and aleve) may be substituted once severe/moderate pain symptoms have subsided.   Wound Care: - Bandages may be removed the day following your procedure.  You may get your incision wet once bandages are removed.  Bandaids may be used to cover the incisions until scab formation.  Topical ointments are optional.  - If you develop a fever greater than 101 degrees, have increased skin redness at the incision sites or pus-like oozing from incisions occurring within 1 week of the procedure, contact radiology at [phone] or [phone].  - Ice pack to back for 15-20 minutes 2-3 time per day for first 2-3 days post procedure.  The ice will expedite muscle healing and help with the pain from the incisions.   Activity: - Bedrest today with limited activity for 24 hours post procedure.  - No driving for 48 hours.  - Increase your activity as tolerated after bedrest (with assistance if necessary).  - Refrain from any strenuous activity or heavy lifting (greater than 10 lbs.).   Follow up: - Contact radiology at [phone] or [phone] if any questions/concerns.  - A physician assistant from radiology will contact you in approximately 1 week.  - If a biopsy was performed at the time of your procedure, your referring physician should receive the results in usually 2-3 days.

## 2016-04-19 NOTE — H&P (Signed)
Chief Complaint: L2 endplate fracture  Referring Physician:Dr. Tyler Nixon  Supervising Physician: Bradley Nixon  Patient Status:  Out-pt  HPI: Bradley Nixon is an 82 y.o. male with a history of lung cancer who fell trying to lift his wife about 6 weeks ago.  He then began having horrible back pain.  He had a CT scan of his lumbar spine that revealed an L2 endplate fracture.  He was put in a LSO brace and has been wearing that.  He states over the last week his pain has actual begun to improve, but he is still having to take intermittent tramadol.  He has been referred for a KP vs VP by Dr. Tammi Nixon.  He denies many other symptoms except some back pain still and some SOB, which has been chronic the last 6-8 months.  He does take coumadin for a fib, but this has been held and his INR is 1.22.  Past Medical History:  Past Medical History:  Diagnosis Date  . Anginal pain ([place])   . Arthritis   . Back pain   . Cancer ([place])   . Complication of anesthesia hard to arouse after anesthesia  . Coronary artery disease   . Fall   . HOH (hard of hearing)   . Hypothyroidism   . Myocardial infarction ([place])   . Pacemaker 05/02/2007   St.Jude  . Permanent atrial fibrillation ([place])   . Pneumonia   . Shortness of breath     Past Surgical History:  Past Surgical History:  Procedure Laterality Date  . CARDIOVERSION  07/28/2011   Procedure: CARDIOVERSION;  Surgeon: Bradley Nixon;  Location: [facility] OR;  Service: Cardiovascular;  Laterality: N/A;  . JOINT REPLACEMENT    . NM MYOCAR PERF WALL MOTION  02/09/2010   no ischemia  . PERMANENT PACEMAKER INSERTION  05/02/2007   St.Jude  . US ECHOCARDIOGRAPHY  09/17/2010   LA moderately dilated,mild TR, EF >55%    Family History:  Family History  Problem Relation Age of Onset  . Heart failure Mother   . Heart attack Father     Social History:  reports that he has quit smoking. He has never used smokeless tobacco. He reports that he does not  drink alcohol or use drugs.  Allergies:  Allergies  Allergen Reactions  . Iodinated Diagnostic Agents Shortness Of Breath    SOB NAUSEA AND VOMITING  . Zocor [Simvastatin]     Weakness     Medications: Medications reviewed in epic  Please HPI for pertinent positives, otherwise complete 10 system ROS negative.  Mallampati Score: MD Evaluation Airway: WNL Heart: WNL Abdomen: WNL Chest/ Lungs: WNL ASA  Classification: 3 Mallampati/Airway Score: Two  Physical Exam: BP 102/68   Pulse 76   Temp 97.8 F (36.6 C) (Oral)   Resp 18   Ht _0  (1.702 m)   Wt 150 lb (68 kg)   SpO2 94%   BMI 23.49 kg/m  Body mass index is 23.49 kg/m. General: pleasant, elderly white male who is laying in bed in NAD HEENT: head is normocephalic, atraumatic.  Sclera are noninjected.  PERRL.  Ears and nose without any masses or lesions.  Mouth is pink and moist, but missing several teeth Heart: permanent a fib.  Normal s1,s2. +murmur No obvious gallops, or rubs noted.  Palpable radial and pedal pulses bilaterally.  Pacemaker in left chest Lungs: CTAB, no wheezes, rhonchi, or rales noted.  Respiratory effort nonlabored Abd: soft, NT, ND, +BS,  no masses, hernias, or organomegaly MS: all 4 extremities are symmetrical with no cyanosis, clubbing, or edema. Psych: A&Ox3 with an appropriate affect.   Labs: Results for orders placed or performed during the hospital encounter of 04/19/16 (from the past 48 hour(s))  APTT     Status: None   Collection Time: 04/19/16  7:15 AM  Result Value Ref Range   aPTT 31 24 - 36 seconds  Basic metabolic panel     Status: Abnormal   Collection Time: 04/19/16  7:15 AM  Result Value Ref Range   Sodium 141 135 - 145 mmol/L   Potassium 4.0 3.5 - 5.1 mmol/L   Chloride 106 101 - 111 mmol/L   CO2 26 22 - 32 mmol/L   Glucose, Bld 112 (H) 65 - 99 mg/dL   BUN 19 6 - 20 mg/dL   Creatinine, Ser 0.75 0.61 - 1.24 mg/dL   Calcium 8.8 (L) 8.9 - 10.3 mg/dL   GFR calc non Af  Amer >60 >60 mL/min   GFR calc Af Amer >60 >60 mL/min    Comment: (NOTE) The eGFR has been calculated using the CKD EPI equation. This calculation has not been validated in all clinical situations. eGFR's persistently <60 mL/min signify possible Chronic Kidney Disease.    Anion gap 9 5 - 15  CBC     Status: Abnormal   Collection Time: 04/19/16  7:15 AM  Result Value Ref Range   WBC 6.7 4.0 - 10.5 K/uL   RBC 3.92 (L) 4.22 - 5.81 MIL/uL   Hemoglobin 11.7 (L) 13.0 - 17.0 g/dL   HCT 38.1 (L) 39.0 - 52.0 %   MCV 97.2 78.0 - 100.0 fL   MCH 29.8 26.0 - 34.0 pg   MCHC 30.7 30.0 - 36.0 g/dL   RDW 15.0 11.5 - 15.5 %   Platelets 188 150 - 400 K/uL  Protime-INR     Status: Abnormal   Collection Time: 04/19/16  7:15 AM  Result Value Ref Range   Prothrombin Time 15.4 (H) 11.4 - 15.2 seconds   INR 1.22     Imaging: No results found.  Assessment/Plan 1. L2 endplate fracture -he does have an iodine allergy.  We have given him 63m of benadryl preprocedure per Dr. DEstanislado Pandy -labs and vitals have been reviewed -Risks and Benefits discussed with the patient including, but not limited to education regarding the natural healing process of compression fractures without intervention, bleeding, infection, cement migration which may cause spinal cord damage, paralysis, pulmonary embolism or even death. All of the patient's questions were answered, patient is agreeable to proceed. Consent signed and in chart.   Thank you for this interesting consult.  I greatly enjoyed meeting Bradley FIUMARAand look forward to participating in their care.  A copy of this report was sent to the requesting provider on this date.  Electronically Signed: OHenreitta Cea9/26/2017, 8:25 AM   I spent a total of  40 Minutes   in face to face in clinical consultation, greater than 50% of which was counseling/coordinating care for L2 endplate fracture

## 2016-04-19 NOTE — Procedures (Signed)
S/PL2 VP

## 2016-04-20 ENCOUNTER — Encounter (HOSPITAL_COMMUNITY): Payer: Self-pay | Admitting: Interventional Radiology

## 2016-04-20 NOTE — Addendum Note (Signed)
Encounter addended by: Heywood Footman, RN on: 04/20/2016  3:30 PM<BR>    Actions taken: Charge Capture section accepted

## 2016-04-21 ENCOUNTER — Ambulatory Visit (HOSPITAL_COMMUNITY): Admission: RE | Admit: 2016-04-21 | Payer: Medicare Other | Source: Ambulatory Visit

## 2016-04-22 ENCOUNTER — Telehealth: Payer: Self-pay | Admitting: *Deleted

## 2016-04-22 NOTE — Telephone Encounter (Signed)
CALLED PATIENT TO ASK QUESTION, LVM FOR A RETURN CALL 

## 2016-04-25 ENCOUNTER — Ambulatory Visit: Payer: Medicare Other | Admitting: Radiation Oncology

## 2016-04-25 ENCOUNTER — Other Ambulatory Visit: Payer: Self-pay | Admitting: Radiation Oncology

## 2016-04-25 DIAGNOSIS — C3411 Malignant neoplasm of upper lobe, right bronchus or lung: Secondary | ICD-10-CM

## 2016-04-25 DIAGNOSIS — C7931 Secondary malignant neoplasm of brain: Secondary | ICD-10-CM

## 2016-04-28 ENCOUNTER — Other Ambulatory Visit (HOSPITAL_COMMUNITY): Payer: Self-pay | Admitting: Interventional Radiology

## 2016-04-28 DIAGNOSIS — S32020A Wedge compression fracture of second lumbar vertebra, initial encounter for closed fracture: Secondary | ICD-10-CM

## 2016-05-03 ENCOUNTER — Telehealth (HOSPITAL_COMMUNITY): Payer: Self-pay | Admitting: Radiology

## 2016-05-03 NOTE — Telephone Encounter (Signed)
Called Bradley Nixon, he complains that his back is not any better. I also spoke with Bradley Nixon's son and he explains to me that his dad is now in a nursing facility and is under the care of Hospice. We canceled Mr. Arroyave appt for 10/12 at 1 pm. JM

## 2016-05-05 ENCOUNTER — Ambulatory Visit (HOSPITAL_COMMUNITY): Admission: RE | Admit: 2016-05-05 | Payer: Medicare Other | Source: Ambulatory Visit

## 2016-05-25 ENCOUNTER — Telehealth: Payer: Self-pay | Admitting: Radiation Oncology

## 2016-05-25 NOTE — Telephone Encounter (Signed)
Received voicemail message from patient wanting to know "when we are going to check on his cancer again." Patient is under [facility] care. Phoned patient's son, Kerolos Nehme. He confirms his father is having a difficult time understanding that "everything must go through hospice now and the focus is comfort care." Cashmere Harmes commits to speaking with his father but, request I call Hospice and ask his hospice nurse, Lattie Haw, to explain this again as well. Aashrith Eves confirms a hospice nurse visits his father twice a week at [facility]. Phoned [place] left a message for Vaughan Sine, hospice nurse for patient, requesting she explain again.

## 2016-05-31 ENCOUNTER — Telehealth: Payer: Self-pay | Admitting: Radiation Oncology

## 2016-05-31 NOTE — Telephone Encounter (Signed)
Received call from Lattie Haw, RN for Hospice inquiring if Dr. Tammi Klippel spoke with the patient's son and offered further radiation therapy. Explained this RN would investigate and phone back.

## 2016-05-31 NOTE — Telephone Encounter (Signed)
I spoke with the patient to let him know we would not need to reschedule follow up for radiation unless he started having progressive symptoms such as SOB or hemoptysis. His hospice nurse will keep [place] informed of his status and we can see him at any point, but at this juncture, don't feel that there is a need for a follow up visit since we can communicate by phone.

## 2016-06-06 ENCOUNTER — Ambulatory Visit (HOSPITAL_COMMUNITY)
Admission: RE | Admit: 2016-06-06 | Discharge: 2016-06-06 | Disposition: A | Discharge status: Home / Self Care | Source: Ambulatory Visit | Attending: Interventional Radiology | Admitting: Interventional Radiology

## 2016-06-06 DIAGNOSIS — S32020A Wedge compression fracture of second lumbar vertebra, initial encounter for closed fracture: Secondary | ICD-10-CM

## 2016-06-06 HISTORY — PX: IR GENERIC HISTORICAL: IMG1180011

## 2016-06-07 ENCOUNTER — Encounter (HOSPITAL_COMMUNITY): Payer: Self-pay | Admitting: Interventional Radiology

## 2016-06-14 ENCOUNTER — Other Ambulatory Visit (HOSPITAL_COMMUNITY): Payer: Self-pay | Admitting: Interventional Radiology

## 2016-06-14 DIAGNOSIS — M4850XS Collapsed vertebra, not elsewhere classified, site unspecified, sequela of fracture: Principal | ICD-10-CM

## 2016-06-14 DIAGNOSIS — M545 Low back pain: Secondary | ICD-10-CM

## 2016-06-14 DIAGNOSIS — IMO0001 Reserved for inherently not codable concepts without codable children: Secondary | ICD-10-CM

## 2016-06-20 ENCOUNTER — Ambulatory Visit (HOSPITAL_COMMUNITY)
Admission: RE | Admit: 2016-06-20 | Discharge: 2016-06-20 | Disposition: A | Discharge status: Home / Self Care | Source: Ambulatory Visit | Attending: Interventional Radiology | Admitting: Interventional Radiology

## 2016-06-20 DIAGNOSIS — M545 Low back pain: Secondary | ICD-10-CM | POA: Insufficient documentation

## 2016-06-20 DIAGNOSIS — M47816 Spondylosis without myelopathy or radiculopathy, lumbar region: Secondary | ICD-10-CM | POA: Insufficient documentation

## 2016-06-20 DIAGNOSIS — M48061 Spinal stenosis, lumbar region without neurogenic claudication: Secondary | ICD-10-CM | POA: Insufficient documentation

## 2016-06-20 DIAGNOSIS — M4850XS Collapsed vertebra, not elsewhere classified, site unspecified, sequela of fracture: Secondary | ICD-10-CM | POA: Insufficient documentation

## 2016-06-20 DIAGNOSIS — IMO0001 Reserved for inherently not codable concepts without codable children: Secondary | ICD-10-CM

## 2016-06-20 DIAGNOSIS — I700 Atherosclerosis of aorta: Secondary | ICD-10-CM | POA: Insufficient documentation

## 2016-06-23 ENCOUNTER — Telehealth (HOSPITAL_COMMUNITY): Payer: Self-pay

## 2016-06-23 NOTE — Telephone Encounter (Signed)
Pt's son will discuss with parents about ct results and call back if they decide to go forward with the procedure. AW

## 2016-06-28 ENCOUNTER — Telehealth (HOSPITAL_COMMUNITY): Payer: Self-pay

## 2016-06-28 NOTE — Telephone Encounter (Signed)
Called to schedule, left message. AW

## 2016-06-30 ENCOUNTER — Ambulatory Visit: Payer: Self-pay | Admitting: Pharmacist Clinician (PhC)/ Clinical Pharmacy Specialist

## 2016-06-30 DIAGNOSIS — Z7901 Long term (current) use of anticoagulants: Secondary | ICD-10-CM

## 2016-06-30 DIAGNOSIS — I482 Chronic atrial fibrillation, unspecified: Secondary | ICD-10-CM

## 2016-07-25 DEATH — deceased

## 2016-09-10 IMAGING — CT CT HEAD W/O CM
1 series · 15 of 30 positions shown, 19 images · non-contrast
Comparison: MRI brain 04/29/2007.

CLINICAL DATA: Lung cancer.  CVA 6 years ago with slurred speech.

EXAM:
CT HEAD WITHOUT CONTRAST
TECHNIQUE: Contiguous axial images were obtained from the base of the skull
through the vertex without intravenous contrast.

[Series 2: head_seq 4.5 h37s st · axial · 0.41mm/px · z∈[+1356,+1500]mm · 15 of 36 slices shown, 19 images]
[im 2/36  brain]
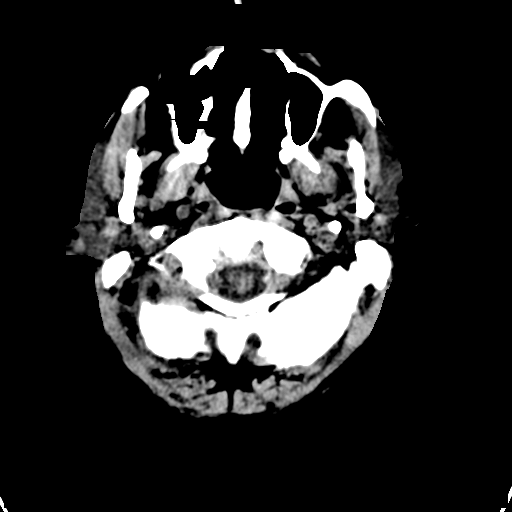
[im 2/36  bone]
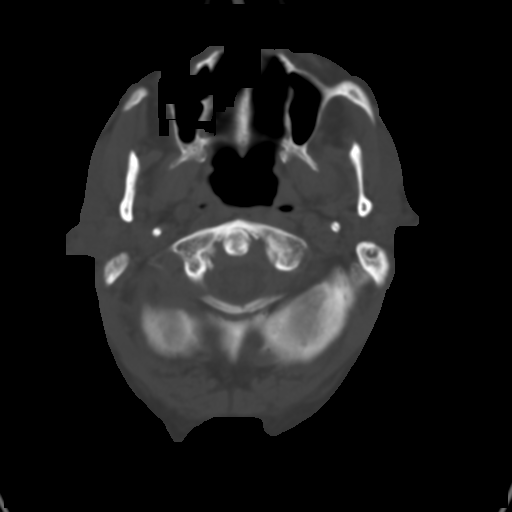
[im 4/36  brain]
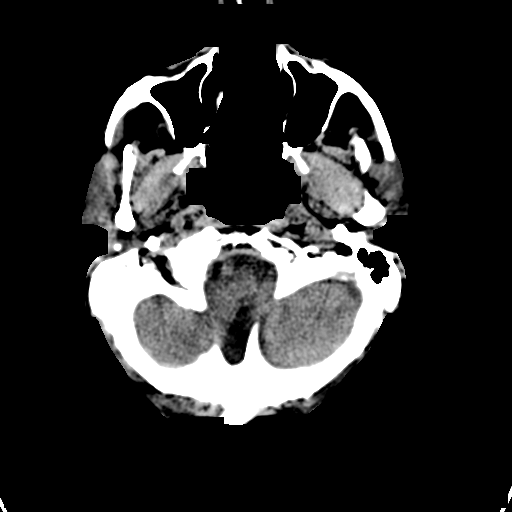
[im 7/36  brain]
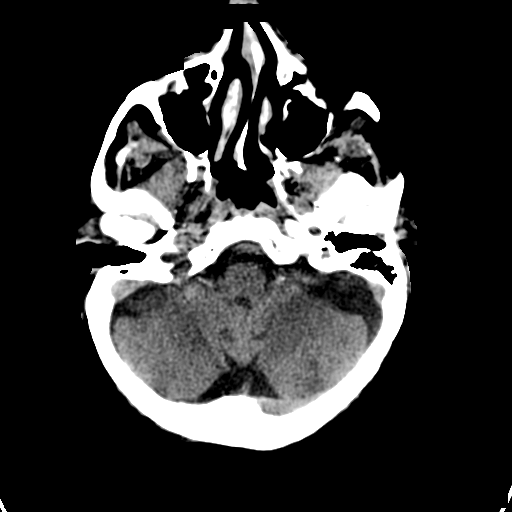
[im 9/36  brain]
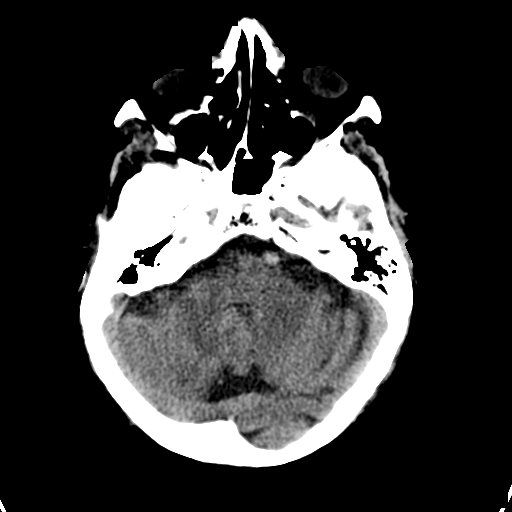
[im 11/36  brain]
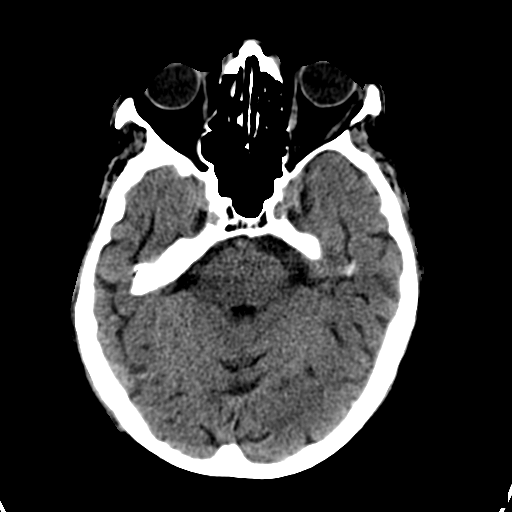
[im 11/36  bone]
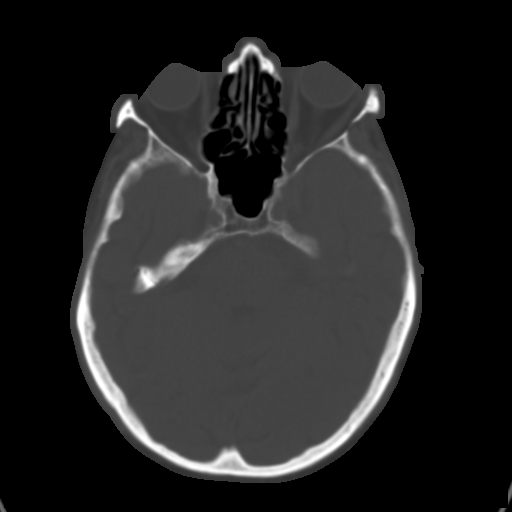
[im 14/36  brain]
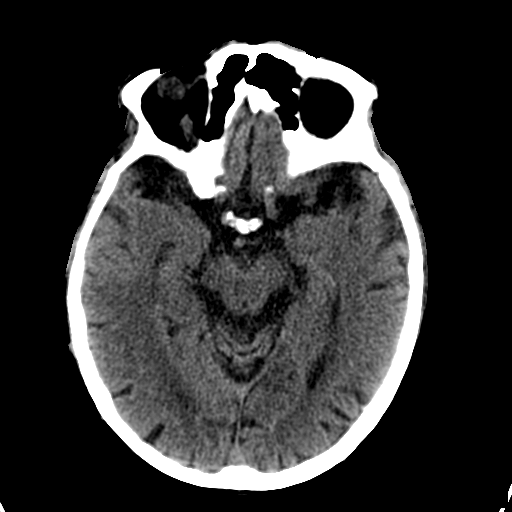
[im 16/36  brain]
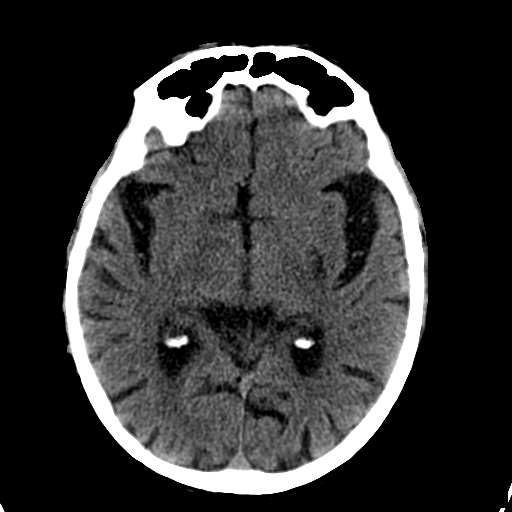
[im 19/36  brain]
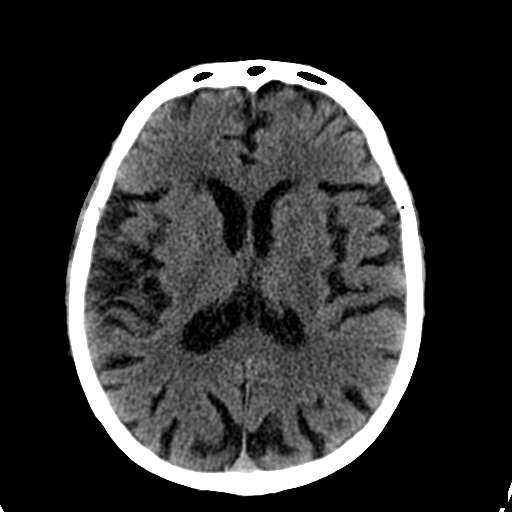
[im 20/36  brain]
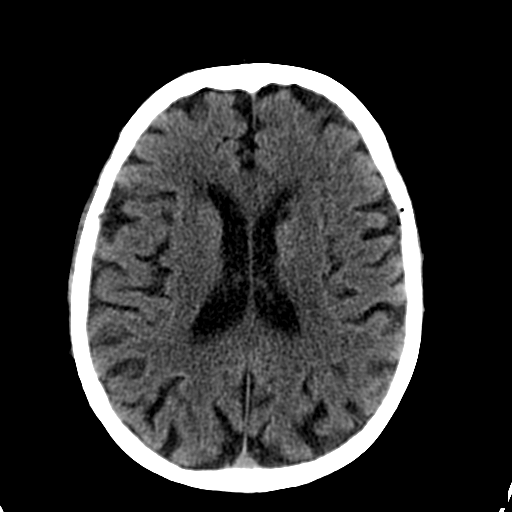
[im 20/36  bone]
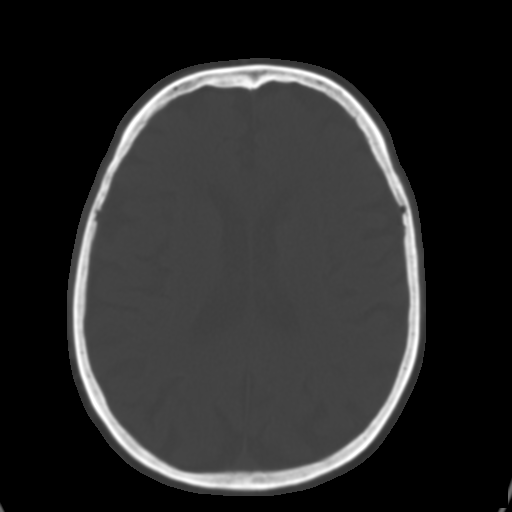
[im 22/36  brain]
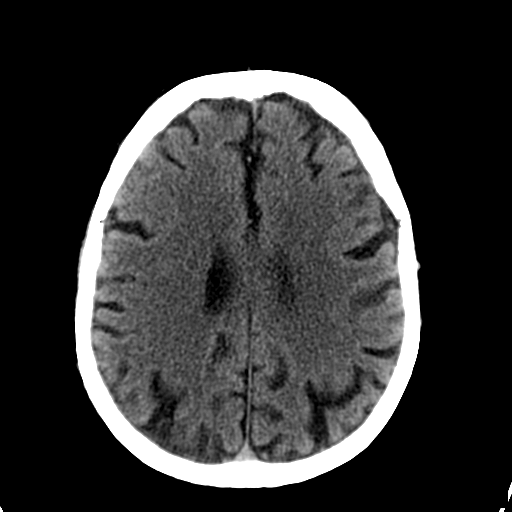
[im 25/36  brain]
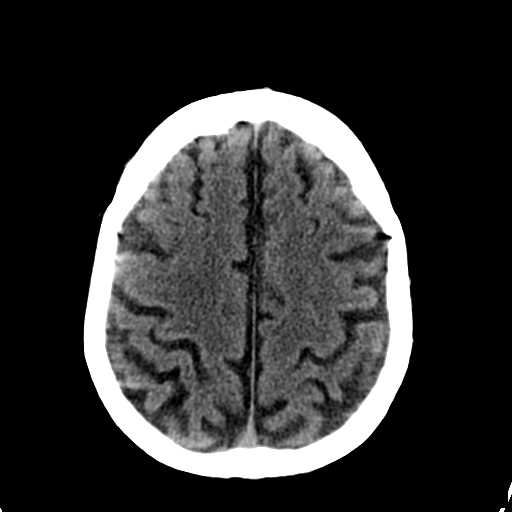
[im 27/36  brain]
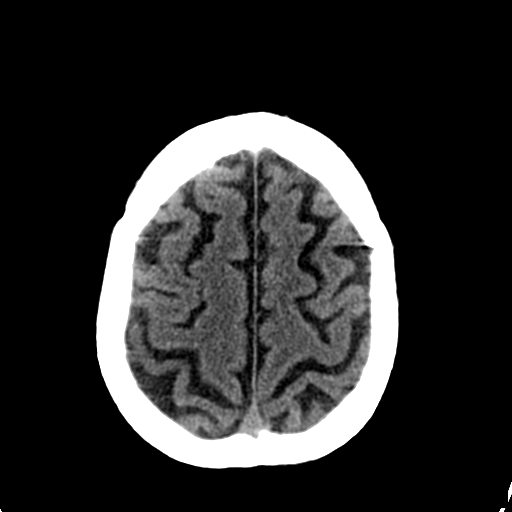
[im 29/36  brain]
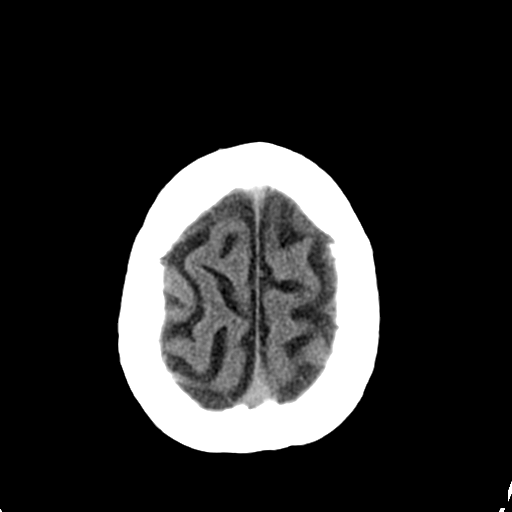
[im 29/36  bone]
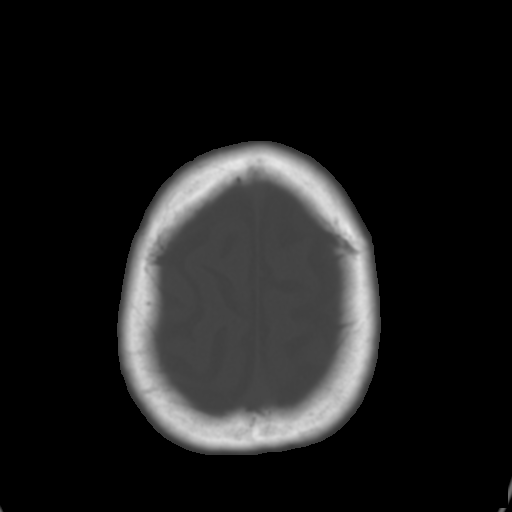
[im 32/36  brain]
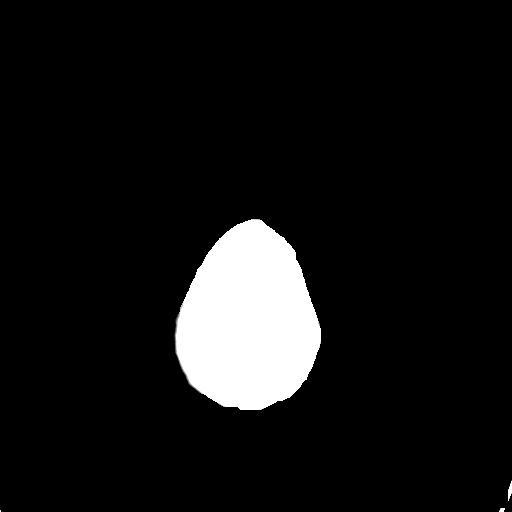
[im 34/36  brain]
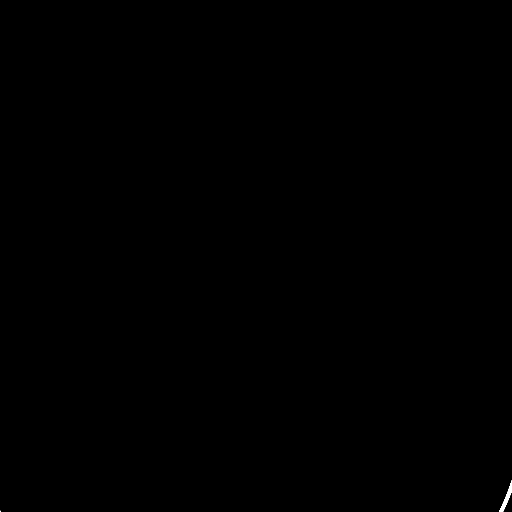

[15 of 30 positions shown; findings below may reference images not displayed]

FINDINGS: A remote lacunar infarct is again noted within the left lentiform
nucleus. Moderate generalized atrophy and diffuse white matter
disease is otherwise similar to the prior exam. White matter changes
extend into the brainstem. The cerebellum is unremarkable.

The ventricles are proportionate to the degree of atrophy. No
significant extra-axial fluid collection is present.

No definite mass lesion is seen. There is no acute infarct or
hemorrhage.

A polyp or mucous retention cyst is present medially within the left
maxillary sinus. The remaining paranasal sinuses and the mastoid air
cells are clear. The calvarium is intact. No significant extra-axial
fluid collection is present. Bilateral lens replacements are noted.
The globes and orbits are otherwise within normal limits.
IMPRESSION: 1. No acute intracranial abnormality.
2. Remote lacunar infarct of the left lentiform nucleus.
3. Moderate generalized atrophy and white matter disease is present
otherwise. This likely reflects the sequela of chronic microvascular
ischemia.

## 2016-11-04 IMAGING — DX DG CHEST 2V
2 series · 2 of 2 positions shown · non-contrast
Comparison: 08/18/2015

CLINICAL DATA: Known right upper lobe lung mass

EXAM:
CHEST  2 VIEW

[chest pa]
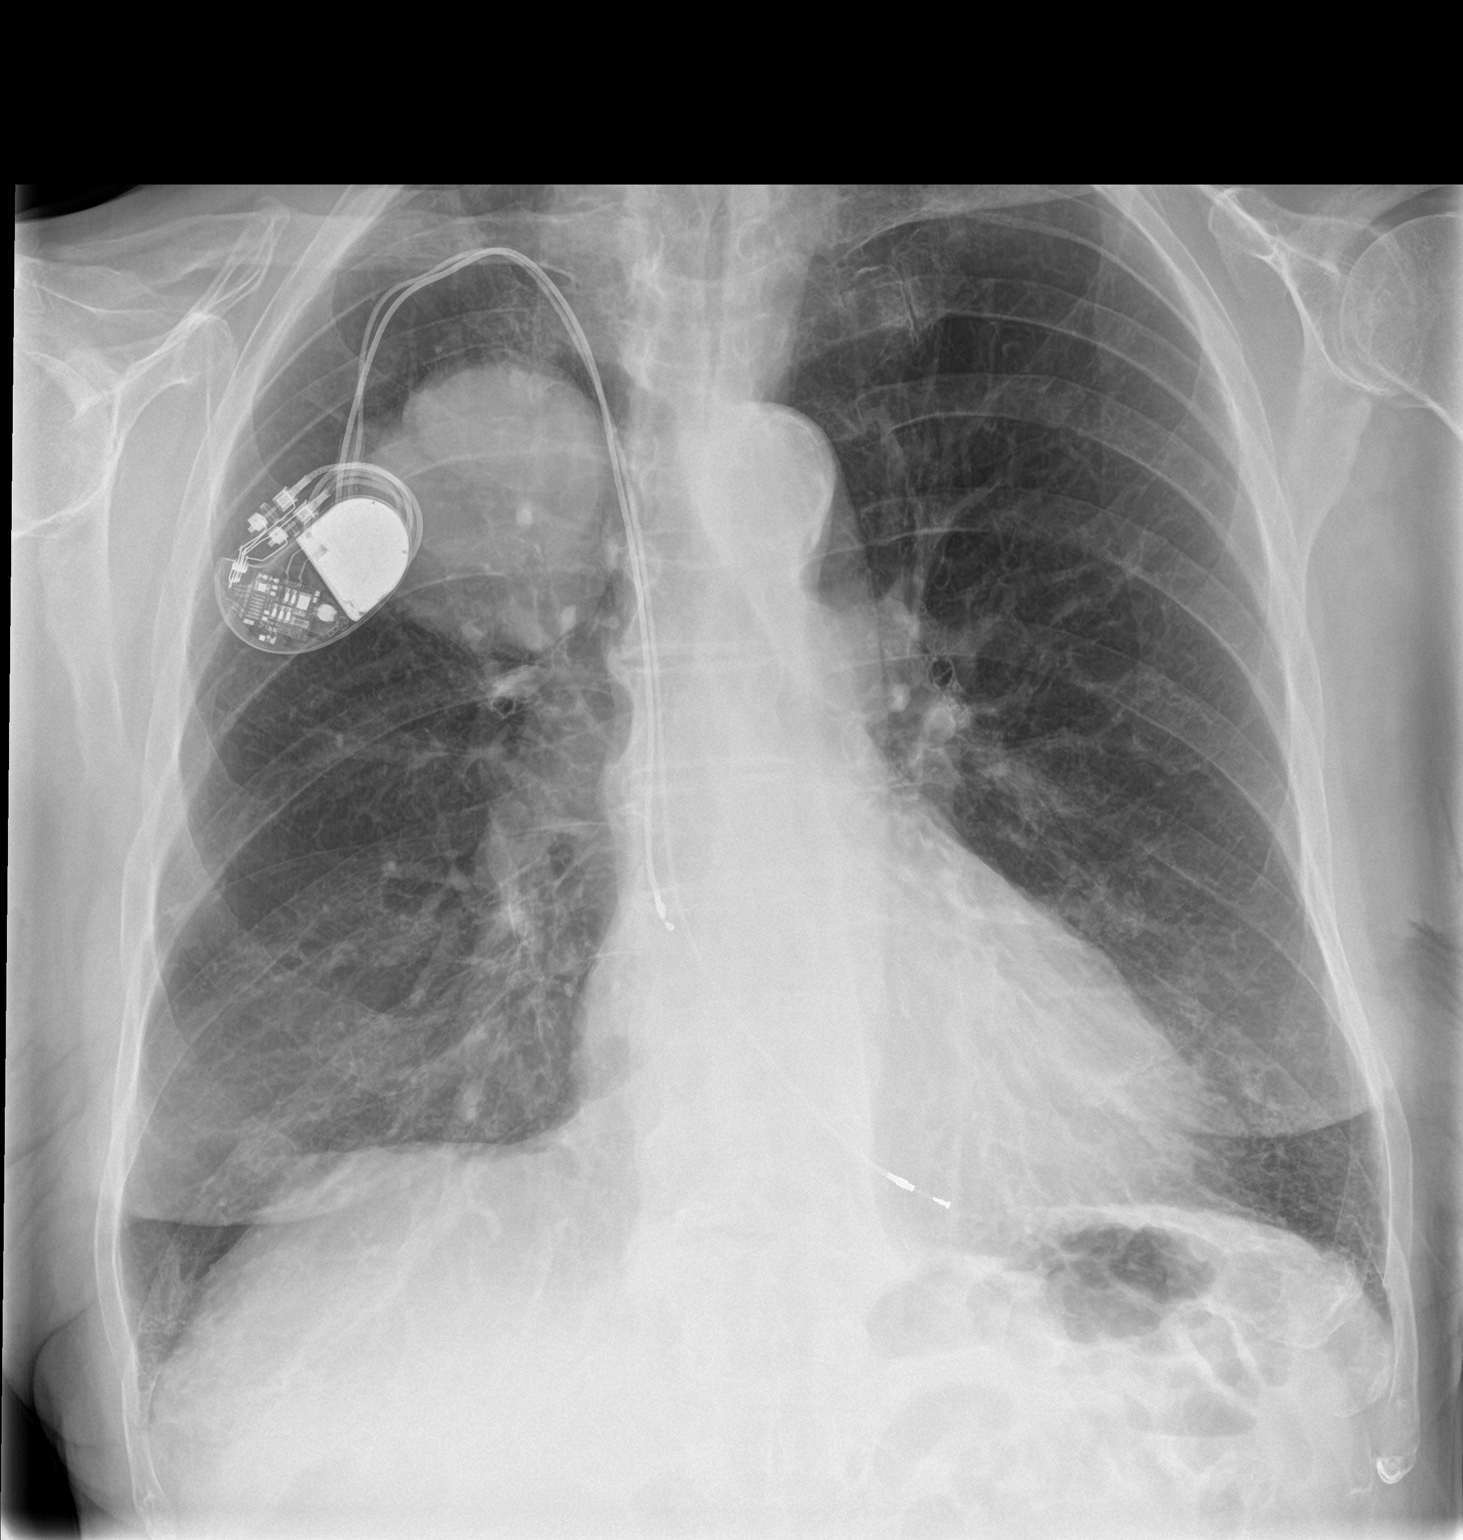

[chest lat]
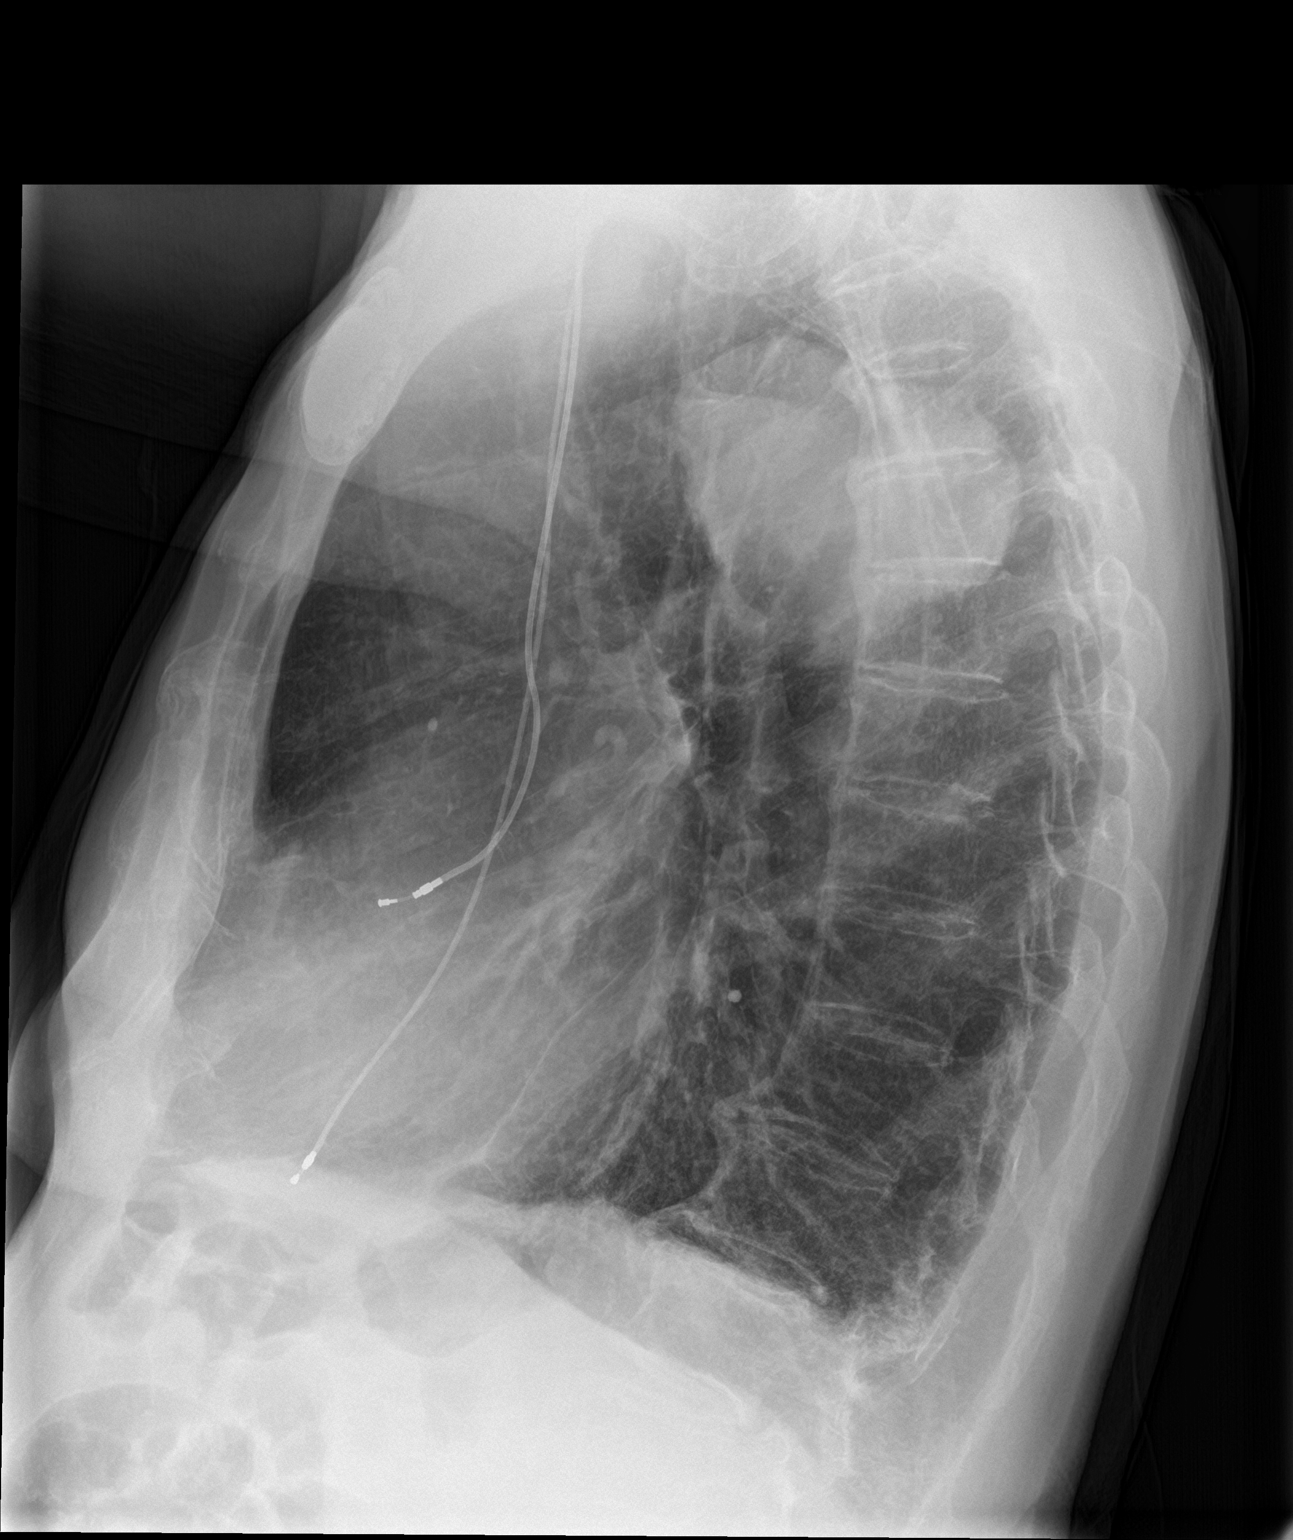

[2 of 2 positions shown; findings below may reference images not displayed]

FINDINGS: Mass has increased in size 4.8 x 7.1 x 6.0 cm to 6.8 x 8.2 x 9.2 cm.
When compared to prior study. Heart size upper normal and stable.
Vascular pattern normal. Mild left base interstitial change likely
represent scarring. No pleural effusion.
IMPRESSION: Increase in the size of a known mass in the right upper lobe.
